# Patient Record
Sex: Male | Born: 1935
Health system: Southern US, Community
[De-identification: ages and names within clinical notes are randomized; demographics above are authoritative.]

## PROBLEM LIST (undated history)

## (undated) DIAGNOSIS — C801 Malignant (primary) neoplasm, unspecified: Secondary | ICD-10-CM

## (undated) HISTORY — PX: TONSILLECTOMY AND ADENOIDECTOMY: SHX28

## (undated) HISTORY — PX: PENILE BIOPSY: SHX6013

## (undated) HISTORY — PX: TONSILLECTOMY: SUR1361

## (undated) HISTORY — DX: Malignant (primary) neoplasm, unspecified: C80.1

## (undated) HISTORY — PX: APPENDECTOMY: SHX54

## (undated) HISTORY — PX: SHOULDER ARTHROSCOPY: SHX128

---

## 1998-04-06 ENCOUNTER — Ambulatory Visit (HOSPITAL_BASED_OUTPATIENT_CLINIC_OR_DEPARTMENT_OTHER): Admission: RE | Admit: 1998-04-06 | Discharge: 1998-04-06 | Payer: Self-pay | Admitting: Urology

## 2001-10-27 ENCOUNTER — Encounter: Admission: RE | Admit: 2001-10-27 | Discharge: 2001-10-27 | Payer: Self-pay | Admitting: Internal Medicine

## 2001-10-27 ENCOUNTER — Encounter: Payer: Self-pay | Admitting: Internal Medicine

## 2003-04-10 ENCOUNTER — Ambulatory Visit (HOSPITAL_BASED_OUTPATIENT_CLINIC_OR_DEPARTMENT_OTHER): Admission: RE | Admit: 2003-04-10 | Discharge: 2003-04-10 | Payer: Self-pay | Admitting: Otolaryngology

## 2004-04-12 ENCOUNTER — Ambulatory Visit (HOSPITAL_COMMUNITY): Admission: RE | Admit: 2004-04-12 | Discharge: 2004-04-12 | Payer: Self-pay | Admitting: Gastroenterology

## 2004-04-18 ENCOUNTER — Ambulatory Visit (HOSPITAL_COMMUNITY): Admission: RE | Admit: 2004-04-18 | Discharge: 2004-04-18 | Payer: Self-pay | Admitting: Gastroenterology

## 2004-06-12 ENCOUNTER — Ambulatory Visit: Admission: RE | Admit: 2004-06-12 | Discharge: 2004-09-10 | Payer: Self-pay | Admitting: Radiation Oncology

## 2004-12-25 ENCOUNTER — Inpatient Hospital Stay (HOSPITAL_COMMUNITY): Admission: AD | Admit: 2004-12-25 | Discharge: 2004-12-27 | Payer: Self-pay | Admitting: Otolaryngology

## 2004-12-25 ENCOUNTER — Encounter (INDEPENDENT_AMBULATORY_CARE_PROVIDER_SITE_OTHER): Payer: Self-pay | Admitting: *Deleted

## 2005-08-08 ENCOUNTER — Ambulatory Visit: Admission: RE | Admit: 2005-08-08 | Discharge: 2005-09-02 | Payer: Self-pay | Admitting: Radiation Oncology

## 2005-09-16 ENCOUNTER — Ambulatory Visit: Admission: RE | Admit: 2005-09-16 | Discharge: 2005-12-09 | Payer: Self-pay | Admitting: Radiation Oncology

## 2007-02-05 ENCOUNTER — Encounter: Admission: RE | Admit: 2007-02-05 | Discharge: 2007-02-05 | Payer: Self-pay | Admitting: Otolaryngology

## 2007-02-10 ENCOUNTER — Ambulatory Visit (HOSPITAL_BASED_OUTPATIENT_CLINIC_OR_DEPARTMENT_OTHER): Admission: RE | Admit: 2007-02-10 | Discharge: 2007-02-10 | Payer: Self-pay | Admitting: Otolaryngology

## 2007-02-14 ENCOUNTER — Ambulatory Visit: Payer: Self-pay | Admitting: Internal Medicine

## 2007-10-26 ENCOUNTER — Encounter (HOSPITAL_BASED_OUTPATIENT_CLINIC_OR_DEPARTMENT_OTHER): Payer: Self-pay | Admitting: General Surgery

## 2007-10-26 ENCOUNTER — Ambulatory Visit (HOSPITAL_COMMUNITY): Admission: RE | Admit: 2007-10-26 | Discharge: 2007-10-26 | Payer: Self-pay | Admitting: General Surgery

## 2008-06-28 ENCOUNTER — Encounter: Admission: RE | Admit: 2008-06-28 | Discharge: 2008-06-28 | Payer: Self-pay | Admitting: Internal Medicine

## 2011-04-29 NOTE — Op Note (Signed)
Nathan Morse, Nathan Morse                ACCOUNT NO.:  [id number]   MEDICAL RECORD NO.:  [id number]          PATIENT TYPE:  AMB   LOCATION:  SDS                          FACILITY:  [facility]   PHYSICIAN:  Leonie Man, M.D.   DATE OF BIRTH:  [birth year]   DATE OF PROCEDURE:  10/26/2007  DATE OF DISCHARGE:  09/20/2007                               OPERATIVE REPORT   PREOPERATIVE DIAGNOSIS:  Left inguinal hernia.   POSTOPERATIVE DIAGNOSIS:  Left inguinal hernia.   PROCEDURE:  Repair of left inguinal hernia with mesh.   SURGEON:  Leonie Man, M.D.   ASSISTANT:  OR tech.   Anesthesia is general.   Findings are left sided direct indirect inguinal hernia.   SPECIMENS TO THE LABORATORY:  Hernia sac to pathology.   Estimated blood loss is minimal.   There are no complication.   The patient returned to the PACU in excellent condition.   Nathan Morse is a 77 year old man status post radiation therapy for  prostate cancer in the remote past who is now referred for repair of a  left sided inguinal hernia which has been mildly symptomatic over the  past several months.  The patient is aware of the risks and potential  benefits of surgery and gives his consent the same.   PROCEDURE:  With the patient positioned supinely, following the  induction of satisfactory general anesthesia, the left lower abdomen is  prepped and draped to be included in the sterile operative field.  A  transverse incision in the lower abdominal crease is deemed through the  skin and subcutaneous tissue, dissecting down to the external oblique  aponeurosis.  This is opened up through the external inguinal ring with  isolation of the spermatic cord which is held with a Penrose drain.  The  ilioinguinal nerve was retracted inferiorly and laterally.  Dissection  along the anterior medial aspect of the cord reveals a very large hernia  sac which is dissected free from the cord contents, carrying dissection  up to the  internal ring.  The sac is opened and there are no  intraabdominal contents within it.  The second loop was suture ligated  at its neck with 2-0 silk sutures and the redundant sac removed and was  forwarded for pathologic evaluation.  A large lipoma with a cord is also  removed and ligated with 2-0 silk.  The floor of the inguinal canal is  then repaired with a polypropylene mesh which is fashioned to fit into  this area and then sutured down at the pubic tubercle and carried up  along the conjoint tendon with a 2-0 Novafil suture up to the internal  ring and again from the conjoint tendon up along the shelving edge of  Poupart ligament to the internal ring.  The mesh is split so as the  spermatic cord may be trimmed between the leaflets and the leaflets of  the mesh were trimmed and sewn down into the internal oblique muscle  behind the cord.  All areas of the dissection were checked for  hemostasis, noted to be dry.  Sponge and instrument counts were verified  and the external oblique aponeurosis closed over the spermatic cord with  a running 2-0 Vicryl suture there by reapproximating the external ring.  Scarpa fascia is closed with a running 3-0 Vicryl suture and the skin is  closed with a running 4-0 Monocryl suture placed intradermally.  The  wound was then reinforced with Steri-Strips, sterile dressings were  applied and the anesthetic reversed, and the patient removed from  operating room to the recovery room in stable condition.  He tolerated  the procedure well.      Leonie Man, M.D.  Electronically Signed     PB/MEDQ  D:  10/26/2007  T:  10/26/2007  Job:  [id number]   cc:   Leonie Man, M.D.  Lindaann Slough, M.D.  Olene Craven, M.D.

## 2011-05-02 NOTE — Discharge Summary (Signed)
NAMEMARILYN, WING                ACCOUNT NO.:  [id number]   MEDICAL RECORD NO.:  [id number]          PATIENT TYPE:  INP   LOCATION:  5708                         FACILITY:  [facility]   PHYSICIAN:  Zola Button T. Lazarus Salines, M.D. DATE OF BIRTH:  1934/12/03   DATE OF ADMISSION:  12/25/2004  DATE OF DISCHARGE:  12/27/2004                                 DISCHARGE SUMMARY   CHIEF COMPLAINT:  Obstructive sleep apnea, nasal obstruction.   HISTORY OF PRESENT ILLNESS:  This is a 77 year old black male with a long  history of poor breathing through his nose, snoring and mild to moderate  obstructive sleep apnea with a respiratory disturbance index of 25.  He had  been wearing CPAP with fair tolerance and fair control, but disliked it.   PAST MEDICAL HISTORY:  No medical allergies.  No current medications.  No  history of hypertension, stroke, heart attack, asthma, HIV, hepatitis,  bleeding disorders or anesthetic reactions.  He had a previous shoulder  surgery, penile implant surgery and appendectomy.   FAMILY HISTORY:  No significant family history.   SOCIAL HISTORY:  He does not smoke or drink.  He does chew tobacco.   REVIEW OF SYSTEMS:  Noncontributory.   PHYSICAL EXAMINATION:  GENERAL:  This was a tall, trim middle-aged black  male in no distress.  HEENT:  He had a left septal deviation with obstruction.  He had a long,  thin soft palate with small residual tonsils.  CHEST:  Clear with unlabored respirations.  HEART:  Regular rate and rhythm without murmurs.  ABDOMEN:  Soft with active bowel sounds and no tenderness.  EXTREMITIES:  Normal configuration.   ADMISSION DIAGNOSIS:  Deviated nasal septum, hypertrophic turbinates with  nasal obstruction, obstructive sleep apnea.   HOSPITAL COURSE:  On the day of his admission the patient was taken to the  operating room, where he underwent a nasal septoplasty, bilateral SMR of  inferior turbinates, tonsillectomy and UPPP.  He was observed  afterward with  good tolerance of analgesics and good recovery from his anesthesia.  On the  first postoperative day, he was breathing well but slow to eat on account of  pain.  He was kept in the hospital one additional day.  On the second  postoperative day, he had his nasal packs pulled out.  Overall, he was  evidencing better status and was discharged to his home in the care of his  family with good pain control, good breathing, and good p.o. intake.   DISCHARGE DIAGNOSES:  Deviated nasal septum with obstruction, hypertrophic  inferior turbinates with obstruction, obstructive sleep apnea.   PROCEDURE:  Reformed septoplasty, submucous resection of turbinates,  tonsillectomy, uvulopalatopharyngoplasty December 25, 2004.   COMPLICATIONS:  None.   CONDITION ON DISCHARGE:  Ambulatory with good p.o. intake, good  respirations, good pain control.   FOLLOWUP:  Return visit in 10 days at my office.   DISCHARGE MEDICATIONS:  Prescriptions given preoperatively for Roxicet  liquid, Tylenol with Codeine liquid and cephalexin liquid.   Instructions written and given.      KTW/MEDQ  D:  03/14/2005  T:  03/14/2005  Job:  [id number]   cc:   Olene Craven, M.D.  [address]  [address]  [place]  [place] [address]  Fax: [phone]

## 2011-05-02 NOTE — Procedures (Signed)
NAMEGORDIE, Nathan Morse                ACCOUNT NO.:  [id number]   MEDICAL RECORD NO.:  [id number]          PATIENT TYPE:  OUT   LOCATION:  SLEEP CENTER                 FACILITY:  [facility]   PHYSICIAN:  Clinton D. Maple Hudson, MD, FCCP, FACPDATE OF BIRTH:  1934/05/01   DATE OF STUDY:  02/10/2007                            NOCTURNAL POLYSOMNOGRAM   INDICATION FOR STUDY:  Hypersomnia with sleep apnea.   EPWORTH SLEEPINESS SCORE:  22/24, BMI 27.8, weight 210 pounds.   MEDICATIONS:  Home medication listed as ipratropium nasal spray.   SLEEP ARCHITECTURE:  Total sleep time 335 minutes with sleep efficiency  77%.  Stage I was 15%, stage II 63%, stages III and IV absent, REM 22%  of total sleep time.  Sleep latency 13 minutes, REM latency 43 minutes,  awake after sleep onset 86 minutes, arousal index 12.7.  No bedtime  medication was taken.   RESPIRATORY DATA:  Split study protocol.  Apnea/hypopnea index (AHI,  RDI) 23.5 obstructive events per hour, indicating moderate obstructive  sleep apnea/hypopnea syndrome before CPAP.  This included 9 obstructive  apneas, one mixed apnea and 47 hypopneas.  Events were not positional.  REM AHI 20.4.  CPAP was titrated to 11 CWP, AHI 0 per hour.  A medium  ResMed Quattro full face mask was used with a heated humidifier.   OXYGEN DATA:  Moderate snoring with oxygen desaturation to a nadir of  85% before CPAP.  After CPAP control, oxygen saturation held 94% on room  air.   CARDIAC DATA:  Sinus rhythm with occasional PVCs.   MOVEMENT-PARASOMNIA:  Occasional limb jerk, insignificant.   IMPRESSIONS-RECOMMENDATIONS:  1. Moderate obstructive sleep apnea/hypopnea syndrome, apnea-hypopnea      index 23.5 per hour with nonpositional events, moderate snoring and      oxygen desaturation to a nadir of 85%.  2. Successful CPAP titration to 11 CWP, apnea-hypopnea index 0 per      hour.  A medium ResMed Quattro full face mask was chosen, with      heated  humidifier.      Clinton D. Maple Hudson, MD, [facility], FACP  Diplomate, Biomedical engineer of Sleep Medicine  Electronically Signed    CDY/MEDQ  D:  02/14/2007 10:39:06  T:  02/14/2007 15:11:53  Job:  [id number]

## 2011-05-02 NOTE — Op Note (Signed)
Nathan Morse, Nathan Morse                            ACCOUNT NO.:  [id number]   MEDICAL RECORD NO.:  [id number]                   PATIENT TYPE:  AMB   LOCATION:  ENDO                                 FACILITY:  [facility]   PHYSICIAN:  Anselmo Rod, M.D.               DATE OF BIRTH:  11/19/1934   DATE OF PROCEDURE:  04/12/2004  DATE OF DISCHARGE:                                 OPERATIVE REPORT   PROCEDURE:  Colonoscopy.   ENDOSCOPIST:  Anselmo Rod, M.D.   INSTRUMENT USED:  Olympus video colonoscope.   INDICATIONS FOR PROCEDURE:  A 77 year old African-American male with a  history of prostate cancer and abnormal weight loss undergoing screening  colonoscopy to rule out colonic polyps, masses, etc.   PREPROCEDURE PREPARATION:  Informed consent was procured from the patient.  The patient was fasted for eight hours prior to the procedure and prepped  with a bottle of magnesium citrate and a gallon of GoLYTELY the night prior  to the procedure.   PREPROCEDURE PHYSICAL EXAMINATION:  VITAL SIGNS:  Stable.  NECK:  Supple.  CHEST:  Clear to auscultation.  HEART:  S1 and S2 regular.  ABDOMEN:  Soft with normal bowel sounds.   DESCRIPTION OF PROCEDURE:  The patient was placed in the left lateral  decubitus position and sedated with 75 mg of Demerol and 7 mg of Versed  intravenously.  Once the patient was adequately sedated and maintained on  low flow oxygen and continuous cardiac monitoring, the Olympus video  colonoscope was advanced from the rectum to the hepatic flexure.  With  extreme difficulty, the patient had a large amount of residual stool in the  colon.  Multiple washings were done.  The patient's position was changed  from the left lateral to the supine and the right lateral positions and  again back to the supine and the left lateral position with gentle  application of abdominal pressure in several locations to reach the cecum.  However, all of these efforts did not help reach  the cecum.  The patient  tolerated the procedure well without complications.  Scope was withdrawn.  Retroflexion in the rectum revealed no abnormalities.   IMPRESSION:  Incomplete colonoscopy up to the hepatic flexure.  No masses or  polyps seen.  Large amount of residual stool in the colon, small lesions  could have been missed.   RECOMMENDATIONS:  Reprep and redo an ACBE.  Outpatient followup after the BE  has been done.                                               Anselmo Rod, M.D.    JNM/MEDQ  D:  04/12/2004  T:  04/13/2004  Job:  [id number]   cc:   Chana Bode  Nesi, M.D.  [address]  [place]  [place] [address]  Fax: [phone]   Olene Craven, M.D.  [address]  [address]  [place]  [place] [address]  Fax: [phone]

## 2011-05-02 NOTE — Op Note (Signed)
Nathan Morse, Nathan Morse                ACCOUNT NO.:  [id number]   MEDICAL RECORD NO.:  [id number]          PATIENT TYPE:  OIB   LOCATION:  2550                         FACILITY:  [facility]   PHYSICIAN:  Zola Button T. Lazarus Salines, M.D. DATE OF BIRTH:  [birth year]   DATE OF PROCEDURE:  12/25/2004  DATE OF DISCHARGE:                                 OPERATIVE REPORT   PREOPERATIVE DIAGNOSES:  1.  Nasal septal deviation with obstruction.  2.  Hypertrophic inferior turbinates with obstruction.  3.  Obstructive sleep apnea.   POSTOPERATIVE DIAGNOSES:  1.  Nasal septal deviation with obstruction.  2.  Hypertrophic inferior turbinates with obstruction.  3.  Obstructive sleep apnea.   PROCEDURE PERFORMED:  1.  Nasal septoplasty.  2.  Bilateral submucous resection of inferior turbinates.  3.  Tonsillectomy.  4.  Uvulopalatopharyngoplasty.   SURGEON:  Gloris Manchester. Lazarus Salines, M.D.   ANESTHESIA:  General orotracheal.   BLOOD LOSS:  Minimal.   COMPLICATIONS:  None.   FINDINGS:  A severe buckled leftward septal deviation with obstruction.  Hypertrophic right inferior turbinate. A large left inferior turbinate with  a crease where the septal spur was abutting the turbinate.  1+ embedded  tonsils. A long relatively thin soft palate, especially the posterior  tonsillar pillars.  A long, thin, central uvula. Clear nasopharynx.   DESCRIPTION OF PROCEDURE:  With the patient in the comfortable supine  position, general orotracheal anesthesia was induced without difficulty. At  an appropriate level, the patient was turned 90 degrees and placed in a semi-  sitting position. Saline moistened throat pack was placed. Nasal vibrissae  were trimmed. Cocaine crystals, 200 milligrams total were applied on cotton  carriers to the anterior ethmoid and sphenopalatine ganglion regions on both  sides. Cocaine solution, 160 milligrams total was applied on 1/2 x 3 inches  cottonoids to both sides of the septum. 1% Xylocaine with  1:100,000  epinephrine, 12 cc total was infiltrated into the membranous columella, into  the nasal spine region, into the anterior floor of the nose on both sides,  and finally into the submucoperichondrial plane of the septum on both sides.  Several minutes were allowed to this to take effect. A sterile preparation  and draping of the midface was accomplished.   The materials were removed from the nose and observed to be intact and  correct in number. The findings were as described above. A small left-sided  floor incision was sharply executed. A right-sided hemitransfixion incision  was executed and carried down to a floor incision. Floor tunnels were  elevated on both sides, carried back to the vomer and brought forward. The  submucoperichondrial plane of the right side of the septum was elevated  taking pains to carefully protect the flap as it was removed from the severe  crease of the septal spur. The right septal flap was raised intact. The  chondroethmoid junction was identified and opened with the Cottle elevator  and the opposite submucoperiosteal plane of the perpendicular plate was  elevated. The superior perpendicular plate was lysed with an open Leane Para-  Middleton forceps. The  inferior portion was submucosally dissected and  rocked free with a closed Jansen-Middleton forceps. There was a heavy septal  spur to the left and there was a moderate sized mucosal rent at this site.  Additional posterior perpendicular plate and the posterior tag of the  quadrangular cartilage were submucosally dissected and resected. The  posterior aspect of the maxillary crest/vomer which was severely displaced  towards the left was also mobilized, submucosally dissected, and removed.  The heavily buckled inferior 3 - 4 mm of the quadrangular cartilage was  incised and the almost horizontal inferior portion was submucosally  dissected. This allowed the septum to trapdoor into the midline, although  it  still tended to have some spring towards the left. Maxillary crest was  cleaned up of small amounts of residual cartilage and perichondrium. The  septum was separated from the upper lateral cartilages transmucosally on the  left side and in the septal tunnel on the right side. This allowed more  freedom to the septum to trapdoor into the midline without residual  deformity. The septum was secured to the nasal spine with a figure-of-eight  4-0 PDS suture. The septal tunnel was carefully suctioned free and  hemostasis was observed. The septal incisions were closed with interrupted 4-  0 chromic sutures.   Just prior to completion of the septoplasty, the inferior turbinates were  infiltrated with a total of 6 cc 1% Xylocaine with 1:100,000 epinephrine.  Beginning on the right side, the anterior hood of the inferior turbinate was  sharply incised just behind the nasal valve region. The medial mucosa was  incised in an anterior upsloping fashion. A laterally based flap was  developed. The turbinate was infractured. Using angled turbinate scissors,  the turbinate bone and lateral mucosa was resected in a posterior  downsloping fashion taking virtually all of the anterior pole and leaving  virtually all the posterior pole. Bony spicules were removed to allow more  prompt healing. Suction cautery was used to reduce the bulbous posterior  pole of the turbinate and cut mucosal edges were also suction coagulated.  The flap was laid back down, the turbinate was outfractured and the right  side was completed. The left side was done in identical fashion.   0.040 reinforced Silastic splints were fashioned and placed against the  septum on both sides and secured thereto with a 3-0 nylon stitch. Double  thickness Telfa packs impregnated with bacitracin ointment were applied low  on the turbinates on both sides. 6.5 mm nasal trumpets which had been shortened to reach in the nasopharynx were placed in  between the Silastic  splints and the tonsil packs to serve as part of the nasal packing, but also  to allow some degree of postoperative airway through the nose. At this point  the nose was completed. Hemostasis was observed. The pharynx was suctioned  free and the throat pack was removed.   At this point the patient was placed in Trendelenburg and the draping was  adjusted. Taking care to protect lips, teeth/gums, and endotracheal tube,  the McIvor mouth gag was introduced, expanded for visualization, and  suspended from the Mayo stand in the standard fashion. The findings were as  described above. Palate retractor and mirror were used to visualize the  nasopharynx with the findings as described above. 0.5% Xylocaine with  1:200,000 epinephrine, 8 cc total was infiltrated into the peritonsillar  plane on both sides and along the palatal arch proposed incision site. The  tattoo placed in  the office was identified.   Using cutting and coagulating cautery, an arch incision was planned just  above the tattoo mark and all the way down the anterior tonsillar pillar on  both sides. A chevron was placed in the midline to correspond with a  posterior mucosal flap to prevent a completely linear scar band along the  arch of the soft palate. Working from the anterior tonsillar pillar, the  capsule of the tonsil was identified on both sides and using the cautery tip  as a blunt dissector, lysing fibrous bands, and coagulating crossing  vessels, the tonsils were dissected free of the muscular fossae, but  remained pedicled on the palatal flap. The  tonsils were removed in their  entirety as the determined by examination of both tonsil and fossa. A small  additional quantity of cautery rendered the tonsil fossae hemostatic.  Working up into the soft palate, the dissection was carried up along the  posterior tonsillar pillar which was shortened but not resected in its  entirety and then across the  soft palate leaving a small flap on the  posterior surface of the musculus uvulae and small portion of the musculus  uvulae was left intact as well. Hemostasis was observed. The uvular muscle  was brought forward into the midline with a 4-0 Vicryl stitch. The soft  palatal posterior pillar arch was incised in a 45 degrees fashion leaving it  laterally and with a midline flap. The lateral flaps were reapproximated to  the anterior tonsillar pillar with interrupted 4-0 Vicryl sutures. The  posterior mucosa of the soft palate was brought forward and stretched  laterally and the midline portion of soft palate was closed in this fashion.  A small raw surface was left on the posterior surface of the soft palate  laterally on either side. Hemostasis was observed. With mirror examination,  a good AP diameter of the nasal pharyngeal introitus was generated and a  good intact closure of the soft palate free margin was also generated. An orogastric tube was briefly placed and essentially no stomach secretions  were evacuated. The tube was removed. At this point the mouth gag was  relaxed for several minutes. Upon re-expansion, hemostasis was persistent.  At this point the mouth gag was relaxed and removed. The dental status was  intact. The patient was returned to Anesthesia, awakened, extubated, and  transferred to recovery in stable condition.   COMMENT:  A 77 year old black male with long history of nasal obstruction,  snoring, documented sleep apnea and the anatomic findings including septal  deviation, hypertrophic turbinates, and a long thin soft palate were the  indications for today's procedure. Anticipate a routine postoperative  recovery with attention to analgesia, antibiosis, hydration, and observation  for bleeding, emesis, airway compromise, or oxygen desaturation. Will  observe him 23 hours in the step-down unit, given his history of sleep  apnea.      Karo   KTW/MEDQ  D:   12/25/2004  T:  12/25/2004  Job:  [id number]   cc:   Olene Craven, M.D.  [address]  [address]  [place]  [place] [address]  Fax: [phone]

## 2011-08-28 ENCOUNTER — Ambulatory Visit (INDEPENDENT_AMBULATORY_CARE_PROVIDER_SITE_OTHER): Payer: Medicare Other

## 2011-08-28 ENCOUNTER — Inpatient Hospital Stay (INDEPENDENT_AMBULATORY_CARE_PROVIDER_SITE_OTHER)
Admission: RE | Admit: 2011-08-28 | Discharge: 2011-08-28 | Disposition: A | Discharge status: Home / Self Care | Payer: Medicare Other | Source: Ambulatory Visit | Attending: Family Medicine | Admitting: Family Medicine

## 2011-08-28 DIAGNOSIS — M799 Soft tissue disorder, unspecified: Secondary | ICD-10-CM

## 2011-09-23 LAB — COMPREHENSIVE METABOLIC PANEL
ALT: 11
Albumin: 3.8
Alkaline Phosphatase: 98
Glucose, Bld: 78
Potassium: 4.3
Sodium: 139
Total Protein: 6.6

## 2011-09-23 LAB — DIFFERENTIAL
Basophils Relative: 1
Eosinophils Absolute: 0
Monocytes Absolute: 0.5
Monocytes Relative: 11

## 2011-09-23 LAB — CBC
Hemoglobin: 14.1
Platelets: 187
RDW: 12.9
WBC: 5

## 2011-09-23 LAB — TSH: TSH: 1.335

## 2015-09-03 ENCOUNTER — Ambulatory Visit (INDEPENDENT_AMBULATORY_CARE_PROVIDER_SITE_OTHER): Payer: Medicare Other | Admitting: Family Medicine

## 2015-09-03 VITALS — BP 126/68 | HR 95 | Temp 98.0°F | Resp 16 | Ht 74.0 in | Wt 198.6 lb

## 2015-09-03 DIAGNOSIS — J019 Acute sinusitis, unspecified: Secondary | ICD-10-CM | POA: Diagnosis not present

## 2015-09-03 MED ORDER — GUAIFENESIN-CODEINE 100-10 MG/5ML PO SOLN: 10.0000 mL | ORAL | Status: DC | PRN

## 2015-09-03 MED ORDER — BENZONATATE 100 MG PO CAPS: 100.0000 mg | ORAL_CAPSULE | Freq: Three times a day (TID) | ORAL | Status: DC | PRN

## 2015-09-03 MED ORDER — AMOXICILLIN 500 MG PO CAPS: 1000.0000 mg | ORAL_CAPSULE | Freq: Two times a day (BID) | ORAL | Status: DC

## 2015-09-03 NOTE — Patient Instructions (Signed)
You will be on an antibiotic so do not need antibiotic eye drops in addition but you can place saline drops. Apply a warm wet compress as often as you can. If you have a lot of discharge, wash your eyelashes with no tears baby shampoo and your fingers. Do NOT use any eye drops for red eyes. Conjunctivitis Conjunctivitis is commonly called "pink eye." Conjunctivitis can be caused by bacterial or viral infection, allergies, or injuries. There is usually redness of the lining of the eye, itching, discomfort, and sometimes discharge. There may be deposits of matter along the eyelids. A viral infection usually causes a watery discharge, while a bacterial infection causes a yellowish, thick discharge. Pink eye is very contagious and spreads by direct contact. You may be given antibiotic eyedrops as part of your treatment. Before using your eye medicine, remove all drainage from the eye by washing gently with warm water and cotton balls. Continue to use the medication until you have awakened 2 mornings in a row without discharge from the eye. Do not rub your eye. This increases the irritation and helps spread infection. Use separate towels from other household members. Wash your hands with soap and water before and after touching your eyes. Use cold compresses to reduce pain and sunglasses to relieve irritation from light. Do not wear contact lenses or wear eye makeup until the infection is gone. SEEK MEDICAL CARE IF:   Your symptoms are not better after 3 days of treatment.  You have increased pain or trouble seeing.  The outer eyelids become very red or swollen. Document Released: 01/08/2005 Document Revised: 02/23/2012 Document Reviewed: 12/01/2005 [facility] Patient Information 2015 [place], [place]. This information is not intended to replace advice given to you by your health care provider. Make sure you discuss any questions you have with your health care provider.  Sinusitis Sinusitis is redness,  soreness, and inflammation of the paranasal sinuses. Paranasal sinuses are air pockets within the bones of your face (beneath the eyes, the middle of the forehead, or above the eyes). In healthy paranasal sinuses, mucus is able to drain out, and air is able to circulate through them by way of your nose. However, when your paranasal sinuses are inflamed, mucus and air can become trapped. This can allow bacteria and other germs to grow and cause infection. Sinusitis can develop quickly and last only a short time (acute) or continue over a long period (chronic). Sinusitis that lasts for more than 12 weeks is considered chronic.  CAUSES  Causes of sinusitis include:  Allergies.  Structural abnormalities, such as displacement of the cartilage that separates your nostrils (deviated septum), which can decrease the air flow through your nose and sinuses and affect sinus drainage.  Functional abnormalities, such as when the small hairs (cilia) that line your sinuses and help remove mucus do not work properly or are not present. SIGNS AND SYMPTOMS  Symptoms of acute and chronic sinusitis are the same. The primary symptoms are pain and pressure around the affected sinuses. Other symptoms include:  Upper toothache.  Earache.  Headache.  Bad breath.  Decreased sense of smell and taste.  A cough, which worsens when you are lying flat.  Fatigue.  Fever.  Thick drainage from your nose, which often is green and may contain pus (purulent).  Swelling and warmth over the affected sinuses. DIAGNOSIS  Your health care provider will perform a physical exam. During the exam, your health care provider may:  Look in your nose for signs  of abnormal growths in your nostrils (nasal polyps).  Tap over the affected sinus to check for signs of infection.  View the inside of your sinuses (endoscopy) using an imaging device that has a light attached (endoscope). If your health care provider suspects that you  have chronic sinusitis, one or more of the following tests may be recommended:  Allergy tests.  Nasal culture. A sample of mucus is taken from your nose, sent to a lab, and screened for bacteria.  Nasal cytology. A sample of mucus is taken from your nose and examined by your health care provider to determine if your sinusitis is related to an allergy. TREATMENT  Most cases of acute sinusitis are related to a viral infection and will resolve on their own within 10 days. Sometimes medicines are prescribed to help relieve symptoms (pain medicine, decongestants, nasal steroid sprays, or saline sprays).  However, for sinusitis related to a bacterial infection, your health care provider will prescribe antibiotic medicines. These are medicines that will help kill the bacteria causing the infection.  Rarely, sinusitis is caused by a fungal infection. In theses cases, your health care provider will prescribe antifungal medicine. For some cases of chronic sinusitis, surgery is needed. Generally, these are cases in which sinusitis recurs more than 3 times per year, despite other treatments. HOME CARE INSTRUCTIONS   Drink plenty of water. Water helps thin the mucus so your sinuses can drain more easily.  Use a humidifier.  Inhale steam 3 to 4 times a day (for example, sit in the bathroom with the shower running).  Apply a warm, moist washcloth to your face 3 to 4 times a day, or as directed by your health care provider.  Use saline nasal sprays to help moisten and clean your sinuses.  Take medicines only as directed by your health care provider.  If you were prescribed either an antibiotic or antifungal medicine, finish it all even if you start to feel better. SEEK IMMEDIATE MEDICAL CARE IF:  You have increasing pain or severe headaches.  You have nausea, vomiting, or drowsiness.  You have swelling around your face.  You have vision problems.  You have a stiff neck.  You have difficulty  breathing. MAKE SURE YOU:   Understand these instructions.  Will watch your condition.  Will get help right away if you are not doing well or get worse. Document Released: 12/01/2005 Document Revised: 04/17/2014 Document Reviewed: 12/16/2011 [facility] Patient Information 2015 [place], [place]. This information is not intended to replace advice given to you by your health care provider. Make sure you discuss any questions you have with your health care provider.

## 2015-09-03 NOTE — Progress Notes (Signed)
Subjective:  This chart was scribed for Delman Cheadle, MD by Thea Alken, ED Scribe. This patient was seen in room 1 and the patient's care was started at 4:32 PM.   Patient ID: Nathan Morse, male    DOB: 02/17/1934, 81 y.o.   MRN: [id number]  HPI   Chief Complaint  Patient presents with  . Cough    x 2 weeks   . Eye Problem    x this morning, possible pink eye    HPI Comments: Nathan Morse is a 81 y.o. male who presents to the [facility] complaining of a gradually worsening, productive cough with white phlegm for 2 weeks. States cough is worse at night, keeping him awake during the night. Pt reports he is usually cold all the time but has had chills as well as post nasal drip, sinus pressure, and nasal congestion. He takes zyrtec, mucinex DM and Atrovent. He's used afrin in the past, but stopped once symptoms improved. He denies wheezing. He denies loss of appetite, but states he lost 30+ lbs over the past 2 years after the death of wife. States he does not know how to cook. No hx of smoking.   Pt woke up this morning with his crusted shut with some redness, occasional itchiness and clear white drainage. He denies eye pain.  Pt is seen at [facility]  There are no active problems to display for this patient.  Past Medical History  Diagnosis Date  . Cancer    Prior to Admission medications   Medication Sig Start Date End Date Taking? Authorizing Provider  cetirizine (ZYRTEC) 10 MG tablet Take 10 mg by mouth daily.   Yes Historical Provider, MD  Dextromethorphan-Guaifenesin (MUCUS RELIEF DM MAX) 5-100 MG/5ML LIQD Take by mouth.   Yes Historical Provider, MD   Review of Systems  Constitutional: Negative for fever, chills and appetite change.  HENT: Positive for congestion, postnasal drip and sinus pressure. Negative for ear discharge and ear pain.   Eyes: Positive for discharge, redness and itching. Negative for pain and visual disturbance.  Respiratory: Positive  for cough and wheezing.    Objective:   Physical Exam  Constitutional: He appears well-developed and well-nourished. No distress.  HENT:  Right Ear: Hearing, external ear and ear canal normal.  Left Ear: Hearing, tympanic membrane, external ear and ear canal normal.  Mouth/Throat: Posterior oropharyngeal erythema present.  Eyes: Conjunctivae and EOM are normal. Pupils are equal, round, and reactive to light.  Neck: Normal range of motion. Neck supple. No thyromegaly present.  Cardiovascular: Normal rate, regular rhythm and normal heart sounds.  Exam reveals no gallop and no friction rub.   No murmur heard. Pulmonary/Chest: Effort normal and breath sounds normal. No respiratory distress. He has no wheezes. He has no rales.  Good air movement  Lymphadenopathy:    He has no cervical adenopathy.  Neurological: He is alert.  Nursing note and vitals reviewed.  Filed Vitals:   09/03/15 1617  BP: 126/68  Pulse: 95  Temp: 98 F (36.7 C)  TempSrc: Oral  Resp: 16  Height: 6\' 2"  (1.88 m)  Weight: 198 lb 9.6 oz (90.084 kg)  SpO2: 99%    Visual Acuity Screening   Right eye Left eye Both eyes  Without correction:     With correction: 20//15 20/15 20/15   Assessment & Plan:   1. Acute sinusitis, recurrence not specified, unspecified location     Meds ordered this encounter  Medications  .  cetirizine (ZYRTEC) 10 MG tablet    Sig: Take 10 mg by mouth daily.  Marland Kitchen Dextromethorphan-Guaifenesin (MUCUS RELIEF DM MAX) 5-100 MG/5ML LIQD    Sig: Take by mouth.  Marland Kitchen amoxicillin (AMOXIL) 500 MG capsule    Sig: Take 2 capsules (1,000 mg total) by mouth 2 (two) times daily.    Dispense:  56 capsule    Refill:  0  . guaiFENesin-codeine 100-10 MG/5ML syrup    Sig: Take 10 mLs by mouth every 4 (four) hours as needed for cough.    Dispense:  180 mL    Refill:  0  . benzonatate (TESSALON) 100 MG capsule    Sig: Take 1-2 capsules (100-200 mg total) by mouth 3 (three) times daily as needed for cough.     Dispense:  40 capsule    Refill:  0    I personally performed the services described in this documentation, which was scribed in my presence. The recorded information has been reviewed and considered, and addended by me as needed.  Delman Cheadle, MD MPH

## 2015-10-02 ENCOUNTER — Ambulatory Visit (INDEPENDENT_AMBULATORY_CARE_PROVIDER_SITE_OTHER): Payer: Medicare Other | Admitting: Emergency Medicine

## 2015-10-02 ENCOUNTER — Ambulatory Visit (INDEPENDENT_AMBULATORY_CARE_PROVIDER_SITE_OTHER): Payer: Medicare Other

## 2015-10-02 VITALS — BP 130/60 | HR 76 | Temp 98.3°F | Resp 18 | Ht 74.0 in | Wt 199.0 lb

## 2015-10-02 DIAGNOSIS — M79645 Pain in left finger(s): Secondary | ICD-10-CM

## 2015-10-02 DIAGNOSIS — S62639A Displaced fracture of distal phalanx of unspecified finger, initial encounter for closed fracture: Secondary | ICD-10-CM

## 2015-10-02 DIAGNOSIS — S62637A Displaced fracture of distal phalanx of left little finger, initial encounter for closed fracture: Secondary | ICD-10-CM | POA: Diagnosis not present

## 2015-10-02 NOTE — Patient Instructions (Signed)
Finger Fracture  Fractures of fingers are breaks in the bones of the fingers. There are many types of fractures. There are different ways of treating these fractures. Your health care provider will discuss the best way to treat your fracture.  CAUSES  Traumatic injury is the main cause of broken fingers. These include:  · Injuries while playing sports.  · Workplace injuries.  · Falls.  RISK FACTORS  Activities that can increase your risk of finger fractures include:  · Sports.  · Workplace activities that involve machinery.  · A condition called osteoporosis, which can make your bones less dense and cause them to fracture more easily.  SIGNS AND SYMPTOMS  The main symptoms of a broken finger are pain and swelling within 15 minutes after the injury. Other symptoms include:  · Bruising of your finger.  · Stiffness of your finger.  · Numbness of your finger.  · Exposed bones (compound fracture) if the fracture is severe.  DIAGNOSIS   The best way to diagnose a broken bone is with X-ray imaging. Additionally, your health care provider will use this X-ray image to evaluate the position of the broken finger bones.   TREATMENT   Finger fractures can be treated with:   · Nonreduction--This means the bones are in place. The finger is splinted without changing the positions of the bone pieces. The splint is usually left on for about a week to 10 days. This will depend on your fracture and what your health care provider thinks.  · Closed reduction--The bones are put back into position without using surgery. The finger is then splinted.  · Open reduction and internal fixation--The fracture site is opened. Then the bone pieces are fixed into place with pins or some type of hardware. This is seldom required. It depends on the severity of the fracture.  HOME CARE INSTRUCTIONS   · Follow your health care provider's instructions regarding activities, exercises, and physical therapy.  · Only take over-the-counter or prescription  medicines for pain, discomfort, or fever as directed by your health care provider.  SEEK MEDICAL CARE IF:  You have pain or swelling that limits the motion or use of your fingers.  SEEK IMMEDIATE MEDICAL CARE IF:   Your finger becomes numb.  MAKE SURE YOU:   · Understand these instructions.  · Will watch your condition.  · Will get help right away if you are not doing well or get worse.     This information is not intended to replace advice given to you by your health care provider. Make sure you discuss any questions you have with your health care provider.     Document Released: 03/15/2001 Document Revised: 09/21/2013 Document Reviewed: 07/13/2013  Elsevier Interactive Patient Education ©2016 [employer]

## 2015-10-02 NOTE — Progress Notes (Signed)
Subjective:  Patient ID: Nathan Morse, male    DOB: 1934/12/15  Age: 81 y.o. MRN: [id number]  CC: Fall   HPI Nathan Morse presents  with an injury to his left small finger. He fell yesterday when he tripped and landed on his outstretched left hand he injured his left small finger. He has pain in the DIP joint he is known to have swollen joints chronically. He denies any other complaint related to fall  History Nathan Morse has a past medical history of Cancer ([place]).   He has past surgical history that includes Appendectomy and Tonsillectomy and adenoidectomy.   His  family history is not on file.  He   reports that he has never smoked. His smokeless tobacco use includes Chew. He reports that he does not drink alcohol or use illicit drugs.  Outpatient Prescriptions Prior to Visit  Medication Sig Dispense Refill  . cetirizine (ZYRTEC) 10 MG tablet Take 10 mg by mouth daily.    Marland Kitchen amoxicillin (AMOXIL) 500 MG capsule Take 2 capsules (1,000 mg total) by mouth 2 (two) times daily. 56 capsule 0  . benzonatate (TESSALON) 100 MG capsule Take 1-2 capsules (100-200 mg total) by mouth 3 (three) times daily as needed for cough. 40 capsule 0  . Dextromethorphan-Guaifenesin (MUCUS RELIEF DM MAX) 5-100 MG/5ML LIQD Take by mouth.    Marland Kitchen guaiFENesin-codeine 100-10 MG/5ML syrup Take 10 mLs by mouth every 4 (four) hours as needed for cough. 180 mL 0   No facility-administered medications prior to visit.    Social History   Social History  . Marital Status: Married    Spouse Name: N/A  . Number of Children: N/A  . Years of Education: N/A   Social History Main Topics  . Smoking status: Never Smoker   . Smokeless tobacco: Current User    Types: Chew  . Alcohol Use: No  . Drug Use: No  . Sexual Activity: Not Asked   Other Topics Concern  . None   Social History Narrative     Review of Systems  Constitutional: Negative for fever, chills and appetite change.  HENT: Negative for congestion, ear  pain, postnasal drip, sinus pressure and sore throat.   Eyes: Negative for pain and redness.  Respiratory: Negative for cough, shortness of breath and wheezing.   Cardiovascular: Negative for leg swelling.  Gastrointestinal: Negative for nausea, vomiting, abdominal pain, diarrhea, constipation and blood in stool.  Endocrine: Negative for polyuria.  Genitourinary: Negative for dysuria, urgency, frequency and flank pain.  Musculoskeletal: Negative for gait problem.  Skin: Negative for rash.  Neurological: Negative for weakness and headaches.  Psychiatric/Behavioral: Negative for confusion and decreased concentration. The patient is not nervous/anxious.     Objective:  BP 130/60 mmHg  Pulse 76  Temp(Src) 98.3 F (36.8 C) (Oral)  Resp 18  Ht 6\' 2"  (1.88 m)  Wt 199 lb (90.266 kg)  BMI 25.54 kg/m2  SpO2 99%  Physical Exam  Constitutional: He is oriented to person, place, and time. He appears well-developed and well-nourished.  HENT:  Head: Normocephalic and atraumatic.  Eyes: Conjunctivae are normal. Pupils are equal, round, and reactive to light.  Pulmonary/Chest: Effort normal.  Musculoskeletal: He exhibits no edema.       Left hand: He exhibits tenderness and bony tenderness.  He has swelling and tenderness at the DIP joint of fifth finger deformity he has marked limitation of motion  Neurological: He is alert and oriented to person, place, and time.  Skin: Skin  is dry.  Psychiatric: He has a normal mood and affect. His behavior is normal. Thought content normal.      Assessment & Plan:   Nathan Morse was seen today for fall.  Diagnoses and all orders for this visit:  Finger pain, left -     DG Finger Little Left; Future  Fracture, finger, distal phalanx, closed, initial encounter -     Ambulatory referral to Orthopedic Surgery   I have discontinued [name] Dextromethorphan-Guaifenesin, amoxicillin, guaiFENesin-codeine, and benzonatate. I am also having him maintain  his cetirizine.  No orders of the defined types were placed in this encounter.    Appropriate red flag conditions were discussed with the patient as well as actions that should be taken.  Patient expressed his understanding.  Follow-up: Return if symptoms worsen or fail to improve.  Roselee Culver, MD   [facility] reading (PRIMARY) by  Dr. Ouida Sills.  Fracture DIP.

## 2016-06-17 ENCOUNTER — Encounter (HOSPITAL_COMMUNITY): Payer: Self-pay | Admitting: Emergency Medicine

## 2016-06-17 ENCOUNTER — Telehealth (HOSPITAL_COMMUNITY): Payer: Self-pay | Admitting: Emergency Medicine

## 2016-06-17 ENCOUNTER — Ambulatory Visit (HOSPITAL_COMMUNITY)
Admission: EM | Admit: 2016-06-17 | Discharge: 2016-06-17 | Disposition: A | Discharge status: Home / Self Care | Payer: Medicare Other | Attending: Family Medicine | Admitting: Family Medicine

## 2016-06-17 DIAGNOSIS — K5909 Other constipation: Secondary | ICD-10-CM

## 2016-06-17 MED ORDER — POLYETHYLENE GLYCOL 3350 17 GM/SCOOP PO POWD: 1.0000 | Freq: Once | ORAL | Status: DC

## 2016-06-17 NOTE — ED Notes (Signed)
Patient went to bathroom, patient had good results

## 2016-06-17 NOTE — ED Notes (Signed)
Last bm has been more than a week.  But patient says he only has a bm 1-2 a week.  Patient has been drinking magnesium citrate today, but no results yet.

## 2016-06-17 NOTE — Discharge Instructions (Signed)

## 2016-06-17 NOTE — ED Provider Notes (Signed)
CSN: [id number]     Arrival date & time 06/17/16  1349 History   None    Chief Complaint  Patient presents with  . Constipation   (Consider location/radiation/quality/duration/timing/severity/associated sxs/prior Treatment) HPI Comments: Patient states he has not had a BM in over a week and went to the pharmacy and was told to drink half of a magnesium citrate and he is having some stomach discomfort.   Patient is a 82 y.o. male presenting with constipation. The history is provided by the patient.  Constipation Severity:  Severe Time since last bowel movement:  1 week Timing:  Constant Progression:  Worsening Chronicity:  New Stool description:  Hard Relieved by:  Laxatives Worsened by:  Nothing tried Ineffective treatments:  None tried Associated symptoms: abdominal pain     Past Medical History  Diagnosis Date  . Cancer [facility])    Past Surgical History  Procedure Laterality Date  . Appendectomy    . Tonsillectomy and adenoidectomy     No family history on file. Social History  Substance Use Topics  . Smoking status: Never Smoker   . Smokeless tobacco: Current User    Types: Chew  . Alcohol Use: No    Review of Systems  Constitutional: Negative.   HENT: Negative.   Eyes: Negative.   Respiratory: Negative.   Gastrointestinal: Positive for abdominal pain and constipation.  Endocrine: Negative.   Genitourinary: Negative.   Musculoskeletal: Negative.   Skin: Negative.   Allergic/Immunologic: Negative.   Neurological: Negative.   Hematological: Negative.   Psychiatric/Behavioral: Negative.     Allergies  Review of patient's allergies indicates no known allergies.  Home Medications   Prior to Admission medications   Medication Sig Start Date End Date Taking? Authorizing Provider  cetirizine (ZYRTEC) 10 MG tablet Take 10 mg by mouth daily.    Historical Provider, MD  polyethylene glycol powder (MIRALAX) powder Take 255 g by mouth once. 06/17/16   Lysbeth Penner, FNP   Meds Ordered and Administered this Visit  Medications - No data to display  BP 150/94 mmHg  Pulse 83  Temp(Src) 97.9 F (36.6 C) (Oral)  Resp 22  SpO2 99% No data found.   Physical Exam  Constitutional: He appears well-developed and well-nourished.  HENT:  Head: Normocephalic.  Right Ear: External ear normal.  Left Ear: External ear normal.  Mouth/Throat: Oropharynx is clear and moist.  Eyes: Conjunctivae are normal. Pupils are equal, round, and reactive to light.  Neck: Normal range of motion.  Cardiovascular: Normal rate, regular rhythm and normal heart sounds.   Pulmonary/Chest: Effort normal and breath sounds normal.  Abdominal: Soft.  Bowel sounds hyperactive.  Digital exam reveals soft stool in rectal vault and patient actually has small BM.    ED Course  Procedures (including critical care time)  Labs Review Labs Reviewed - No data to display  Imaging Review No results found.   Visual Acuity Review  Right Eye Distance:   Left Eye Distance:   Bilateral Distance:    Right Eye Near:   Left Eye Near:    Bilateral Near:         MDM   1. Other constipation   After digital exam patient is taken to bathroom where he has BM with good results. Advised to go home and continue the rest of the half bottle of magnesium citrate and  Then take miralax qd. Miralax one scoop qd #255 grams and follow up with pcp for chronic constipation.  Lysbeth Penner, FNP 06/17/16 1451

## 2016-06-17 NOTE — ED Notes (Signed)
Pharmacy questioned prescribed, daily dose.  Clarified order with bill oxford, np.  Mirilax Take 17g once a day.  Quantity 255 g.  , no refills.

## 2016-07-14 ENCOUNTER — Ambulatory Visit
Admission: RE | Admit: 2016-07-14 | Discharge: 2016-07-14 | Disposition: A | Discharge status: Home / Self Care | Payer: Medicare Other | Source: Ambulatory Visit | Attending: Internal Medicine | Admitting: Internal Medicine

## 2016-07-14 ENCOUNTER — Other Ambulatory Visit: Payer: Self-pay | Admitting: Internal Medicine

## 2016-07-14 DIAGNOSIS — M25562 Pain in left knee: Secondary | ICD-10-CM

## 2017-03-24 DIAGNOSIS — R490 Dysphonia: Secondary | ICD-10-CM | POA: Insufficient documentation

## 2017-05-28 ENCOUNTER — Emergency Department (HOSPITAL_COMMUNITY)
Admission: EM | Admit: 2017-05-28 | Discharge: 2017-05-28 | Disposition: A | Discharge status: Home / Self Care | Payer: Medicare Other | Attending: Emergency Medicine | Admitting: Emergency Medicine

## 2017-05-28 ENCOUNTER — Encounter (HOSPITAL_COMMUNITY): Payer: Self-pay | Admitting: Emergency Medicine

## 2017-05-28 DIAGNOSIS — Y748 Miscellaneous general hospital and personal-use devices associated with adverse incidents, not elsewhere classified: Secondary | ICD-10-CM | POA: Diagnosis not present

## 2017-05-28 DIAGNOSIS — N4830 Priapism, unspecified: Secondary | ICD-10-CM

## 2017-05-28 DIAGNOSIS — F17220 Nicotine dependence, chewing tobacco, uncomplicated: Secondary | ICD-10-CM | POA: Insufficient documentation

## 2017-05-28 DIAGNOSIS — T83490A Other mechanical complication of penile (implanted) prosthesis, initial encounter: Secondary | ICD-10-CM | POA: Diagnosis present

## 2017-05-28 NOTE — ED Triage Notes (Signed)
Pt presents with penile pump inflated for past 2 hrs and unable to get down. Pt reports discomfort.

## 2017-05-28 NOTE — ED Provider Notes (Signed)
[place] DEPT Provider Note   CSN: [id number] Arrival date & time: 05/28/17  2157  By signing my name below, I, Dora Sims, attest that this documentation has been prepared under the direction and in the presence of physician practitioner, Virgel Manifold, MD. Electronically Signed: Dora Sims, Scribe. 05/28/2017. 10:55 PM.  History   Chief Complaint Chief Complaint  Patient presents with  . Penile Pump Problem   The history is provided by the patient. No language interpreter was used.    HPI Comments: Nathan Morse is a 83 y.o. male who presents to the Emergency Department complaining of a problem with his penile pump. He states his pump has been inflated for the past few hours and he has been unable to deflate it because he cannot locate the lever. Patient has never had this problem in the past and has used a penile pump for 10 years. He denies any testicular pain or any other associated symptoms.  Past Medical History:  Diagnosis Date  . Cancer ([place])     There are no active problems to display for this patient.   Past Surgical History:  Procedure Laterality Date  . APPENDECTOMY    . TONSILLECTOMY AND ADENOIDECTOMY         Home Medications    Prior to Admission medications   Medication Sig Start Date End Date Taking? Authorizing Provider  polyethylene glycol powder (MIRALAX) powder Take 255 g by mouth once. Patient not taking: Reported on 05/28/2017 06/17/16   Lysbeth Penner, FNP    Family History No family history on file.  Social History Social History  Substance Use Topics  . Smoking status: Never Smoker  . Smokeless tobacco: Current User    Types: Chew  . Alcohol use No     Allergies   Patient has no known allergies.   Review of Systems Review of Systems  Genitourinary: Negative for testicular pain.  Skin: Negative for color change.  All other systems reviewed and are negative.  Physical Exam Updated Vital Signs BP (!) 144/82 (BP  Location: Right Arm)   Pulse 70   Temp 98.4 F (36.9 C) (Oral)   Resp 16   Ht 6\' 1"  (1.854 m)   Wt 200 lb (90.7 kg)   SpO2 99%   BMI 26.39 kg/m   Physical Exam  Constitutional: He appears well-developed and well-nourished.  HENT:  Head: Normocephalic.  Right Ear: External ear normal.  Left Ear: External ear normal.  Nose: Nose normal.  Eyes: Conjunctivae are normal. Right eye exhibits no discharge. Left eye exhibits no discharge.  Neck: Normal range of motion.  Cardiovascular: Normal rate, regular rhythm and normal heart sounds.   No murmur heard. Pulmonary/Chest: Effort normal and breath sounds normal. No respiratory distress. He has no wheezes. He has no rales.  Abdominal: Soft. There is no tenderness. There is no rebound and no guarding.  Genitourinary:  Genitourinary Comments: Erect penis. Device palpable in scrotum. No concerning lesions noted.   Musculoskeletal: Normal range of motion. He exhibits no edema or tenderness.  Neurological: He is alert. No cranial nerve deficit. Coordination normal.  Skin: Skin is warm and dry. No rash noted. No erythema. No pallor.  Psychiatric: He has a normal mood and affect. His behavior is normal.  Nursing note and vitals reviewed.  ED Treatments / Results  Labs (all labs ordered are listed, but only abnormal results are displayed) Labs Reviewed - No data to display  EKG  EKG Interpretation None  Radiology No results found.  Procedures Procedures (including critical care time)  DIAGNOSTIC STUDIES: Oxygen Saturation is 99% on RA, normal by my interpretation.    COORDINATION OF CARE: 10:49 PM Discussed treatment plan with pt at bedside and pt agreed to plan.  Medications Ordered in ED Medications - No data to display   Initial Impression / Assessment and Plan / ED Course  I have reviewed the triage vital signs and the nursing notes.  Pertinent labs & imaging results that were available during my care of the  patient were reviewed by me and considered in my medical decision making (see chart for details).     83yM with penis prosthesis problem. I'm not sure what I did that he wasn't, but I managed to deflate it. He feels fine now. No other complaints. He reports this is the second time he has had difficulty in the past week after about a decade of no significant issues. Recommended he follow-up with his urologist.   Final Clinical Impressions(s) / ED Diagnoses   Final diagnoses:  Priapism, unspecified    New Prescriptions New Prescriptions   No medications on file   I personally preformed the services scribed in my presence. The recorded information has been reviewed is accurate. Virgel Manifold, MD.    Virgel Manifold, MD 06/08/17 [phone]

## 2017-08-27 ENCOUNTER — Encounter (HOSPITAL_COMMUNITY): Payer: Self-pay | Admitting: Emergency Medicine

## 2017-08-27 ENCOUNTER — Ambulatory Visit (HOSPITAL_COMMUNITY)
Admission: EM | Admit: 2017-08-27 | Discharge: 2017-08-27 | Disposition: A | Discharge status: Home / Self Care | Payer: Medicare Other | Attending: Family Medicine | Admitting: Family Medicine

## 2017-08-27 DIAGNOSIS — T63441A Toxic effect of venom of bees, accidental (unintentional), initial encounter: Secondary | ICD-10-CM

## 2017-08-27 DIAGNOSIS — W57XXXA Bitten or stung by nonvenomous insect and other nonvenomous arthropods, initial encounter: Secondary | ICD-10-CM | POA: Diagnosis not present

## 2017-08-27 DIAGNOSIS — T63444A Toxic effect of venom of bees, undetermined, initial encounter: Secondary | ICD-10-CM

## 2017-08-27 NOTE — Discharge Instructions (Signed)
You may take Benadryl for any symptoms. If you develop problems with breathing, wheezing, swelling in the mouth or throat or in the hands or feet, hives, sweating or other problems in the next few hours good rectal emergency department. It is unlikely at this time that she will develop any of these serious symptoms related to your cousins death.

## 2017-08-27 NOTE — ED Provider Notes (Signed)
[place]    CSN: [id number] Arrival date & time: 08/27/17  1923     History   Chief Complaint Chief Complaint  Patient presents with  . Insect Bite    HPI Nathan Morse is a 83 y.o. male.   83 year old healthy-appearing male presents to the urgent care after he was stung by 3 wasps. One to the back of the right hand, one to the left upper arm and one to the right ear. This occurred at 7 PM. He has had no symptoms from the stings. The only complaint is some mild burning at the bee sting sites. The reason for coming to the urgent care was puffs here because of a cousin that died from bee stings in the remote past. He said states he feels well and has no other complaints. He has taken no medication and states he does not really want to.      Past Medical History:  Diagnosis Date  . Cancer ([place])     There are no active problems to display for this patient.   Past Surgical History:  Procedure Laterality Date  . APPENDECTOMY    . TONSILLECTOMY AND ADENOIDECTOMY         Home Medications    Prior to Admission medications   Not on File    Family History No family history on file.  Social History Social History  Substance Use Topics  . Smoking status: Never Smoker  . Smokeless tobacco: Current User    Types: Chew  . Alcohol use No     Allergies   Patient has no known allergies.   Review of Systems Review of Systems  Constitutional: Negative.  Negative for fever.  HENT: Negative.   Respiratory: Negative.  Negative for chest tightness, shortness of breath and wheezing.   Cardiovascular: Negative for chest pain and leg swelling.  Gastrointestinal: Negative.   Skin: Negative.  Negative for rash.  Neurological: Negative.   All other systems reviewed and are negative.    Physical Exam Triage Vital Signs ED Triage Vitals [08/27/17 1955]  Enc Vitals Group     BP (!) 132/93     Pulse Rate 79     Resp 20     Temp 98.1 F (36.7 C)   Temp Source Oral     SpO2 98 %     Weight      Height      Head Circumference      Peak Flow      Pain Score      Pain Loc      Pain Edu?      Excl. in [place]?    No data found.   Updated Vital Signs BP (!) 132/93 (BP Location: Right Arm)   Pulse 79   Temp 98.1 F (36.7 C) (Oral)   Resp 20   SpO2 98%   Visual Acuity Right Eye Distance:   Left Eye Distance:   Bilateral Distance:    Right Eye Near:   Left Eye Near:    Bilateral Near:     Physical Exam  Constitutional: He is oriented to person, place, and time. He appears well-developed and well-nourished. No distress.  HENT:  Mouth/Throat: Oropharynx is clear and moist. No oropharyngeal exudate.  Examination of the bee sting to the right ear reveals a 4 mm slightly raised area of the skin. No erythema or swelling.  Eyes: EOM are normal.  Neck: Normal range of motion. Neck supple.  Cardiovascular: Normal  rate, regular rhythm, normal heart sounds and intact distal pulses.   Pulmonary/Chest: Effort normal and breath sounds normal. No respiratory distress. He has no wheezes. He has no rales.  Musculoskeletal: He exhibits no edema.  Lymphadenopathy:    He has no cervical adenopathy.  Neurological: He is alert and oriented to person, place, and time. He exhibits normal muscle tone.  Skin: Skin is warm and dry. He is not diaphoretic.  Psychiatric: He has a normal mood and affect.  Nursing note and vitals reviewed.    UC Treatments / Results  Labs (all labs ordered are listed, but only abnormal results are displayed) Labs Reviewed - No data to display  EKG  EKG Interpretation None       Radiology No results found.  Procedures Procedures (including critical care time)  Medications Ordered in UC Medications - No data to display   Initial Impression / Assessment and Plan / UC Course  I have reviewed the triage vital signs and the nursing notes.  Pertinent labs & imaging results that were available during my  care of the patient were reviewed by me and considered in my medical decision making (see chart for details).     You may take Benadryl for any symptoms. If you develop problems with breathing, wheezing, swelling in the mouth or throat or in the hands or feet, hives, sweating or other problems in the next few hours good rectal emergency department. It is unlikely at this time that she will develop any of these serious symptoms related to your cousins death.   Final Clinical Impressions(s) / UC Diagnoses   Final diagnoses:  Bee sting, undetermined intent, initial encounter    New Prescriptions Current Discharge Medication List       Controlled Substance Prescriptions [place] Controlled Substance Registry consulted? Not Applicable   Janne Napoleon, NP 08/27/17 2033

## 2017-08-27 NOTE — ED Triage Notes (Addendum)
Patient states he was stung by 4-5 wasps around 7:00pm this evening.  Patient has not had reactions in the past, but had a family member to die due to stinging insects and this scared him.  Patient is not having any hives, no swelling, no difficulty breathing

## 2017-08-27 NOTE — ED Notes (Signed)
Patient verbalized understanding of discharge instructions and denies any further needs or questions at this time. VS stable. Patient ambulatory with steady gait.  

## 2018-05-24 ENCOUNTER — Ambulatory Visit
Admission: RE | Admit: 2018-05-24 | Discharge: 2018-05-24 | Disposition: A | Discharge status: Home / Self Care | Payer: Medicare Other | Source: Ambulatory Visit | Attending: Nurse Practitioner | Admitting: Nurse Practitioner

## 2018-05-24 ENCOUNTER — Other Ambulatory Visit: Payer: Self-pay | Admitting: Nurse Practitioner

## 2018-05-24 DIAGNOSIS — R634 Abnormal weight loss: Secondary | ICD-10-CM

## 2018-11-17 ENCOUNTER — Encounter: Payer: Self-pay | Admitting: Nurse Practitioner

## 2018-11-17 ENCOUNTER — Ambulatory Visit (INDEPENDENT_AMBULATORY_CARE_PROVIDER_SITE_OTHER): Payer: Medicare Other | Admitting: Nurse Practitioner

## 2018-11-17 ENCOUNTER — Ambulatory Visit: Payer: Medicare Other

## 2018-11-17 VITALS — BP 132/72 | HR 72 | Temp 98.2°F | Ht 72.5 in | Wt 191.2 lb

## 2018-11-17 DIAGNOSIS — Z0000 Encounter for general adult medical examination without abnormal findings: Secondary | ICD-10-CM

## 2018-11-17 DIAGNOSIS — Z23 Encounter for immunization: Secondary | ICD-10-CM

## 2018-11-17 DIAGNOSIS — R591 Generalized enlarged lymph nodes: Secondary | ICD-10-CM

## 2018-11-17 DIAGNOSIS — M545 Low back pain, unspecified: Secondary | ICD-10-CM

## 2018-11-17 DIAGNOSIS — G8929 Other chronic pain: Secondary | ICD-10-CM | POA: Diagnosis not present

## 2018-11-17 DIAGNOSIS — M25562 Pain in left knee: Secondary | ICD-10-CM

## 2018-11-17 NOTE — Progress Notes (Signed)
Subjective:     Patient ID: Nathan Morse , male    DOB: 07/06/1934 , 84 y.o.   MRN: [id number]   Chief Complaint  Patient presents with  . Annual Exam    HPI  Health Maintenance Weight is down 4lbs since July still eating the same.   Men's preventive visit. Patient Health Questionnaire (PHQ-2) is    Clinical Support from 11/17/2018 in [facility]  PHQ-2 Total Score  0    . Patient is on a regular diet, he eats out daily and drinks about 2 bottles of water daily. Marital status: Married. Relevant history for alcohol use is:    Social History   Substance and Sexual Activity  Alcohol Use No  . Alcohol/week: 0.0 standard drinks  . Relevant history for tobacco use is:  Social History   Tobacco Use  Smoking Status Never Smoker  Smokeless Tobacco Current User  . Types: Chew  .  Past Medical History:  Diagnosis Date  . Cancer [facility]      Family History  Adopted: Yes  Family history unknown: Yes     Current Outpatient Medications:  .  alfuzosin (UROXATRAL) 10 MG 24 hr tablet, Take 10 mg by mouth daily with breakfast., Disp: , Rfl:  .  mirabegron ER (MYRBETRIQ) 50 MG TB24 tablet, Take 50 mg by mouth daily., Disp: , Rfl:    No Known Allergies   Review of Systems  Constitutional: Negative.  Negative for fatigue.  HENT: Negative.   Eyes: Negative.   Respiratory: Negative.   Cardiovascular: Negative.   Gastrointestinal: Negative.   Endocrine: Negative.  Negative for polydipsia, polyphagia and polyuria.  Genitourinary: Negative.   Musculoskeletal: Positive for arthralgias (left knee pain) and back pain.  Skin: Negative.   Allergic/Immunologic: Negative.   Neurological: Negative.  Negative for dizziness and headaches.  Hematological: Negative.   Psychiatric/Behavioral: Negative.      Today's Vitals   11/17/18 1123  BP: 132/72  Pulse: 72  Temp: 98.2 F (36.8 C)  TempSrc: Oral  SpO2: 99%  Weight: 191 lb 3.2 oz (86.7 kg)  Height: 6' 0.5"  (1.842 m)  PainSc: 2    Body mass index is 25.57 kg/m.   Objective:  Physical Exam Vitals signs reviewed.  Constitutional:      Appearance: He is well-developed and well-nourished.  HENT:     Head: Normocephalic and atraumatic.     Right Ear: External ear normal.     Left Ear: External ear normal.     Nose: Nose normal.     Mouth/Throat:     Mouth: Oropharynx is clear and moist.  Eyes:     Extraocular Movements: EOM normal.     Conjunctiva/sclera: Conjunctivae normal.     Pupils: Pupils are equal, round, and reactive to light.  Neck:     Musculoskeletal: Normal range of motion and neck supple.  Cardiovascular:     Rate and Rhythm: Normal rate and regular rhythm.     Heart sounds: Normal heart sounds, S1 normal and S2 normal. No murmur.  Pulmonary:     Effort: Pulmonary effort is normal.     Breath sounds: Normal breath sounds.  Abdominal:     General: Bowel sounds are normal.     Palpations: Abdomen is soft.  Musculoskeletal: Normal range of motion.        General: No deformity or edema.     Comments: Tenderness to low back on palpation  Neurological:     Mental  Status: He is alert and oriented to person, place, and time.         Assessment And Plan:     1. Encounter for health maintenance examination . Behavior modifications discussed and diet history reviewed.   . Pt will continue to exercise regularly and modify diet with low GI, plant based foods and decrease intake of processed foods.  . Recommend intake of daily multivitamin, Vitamin D, and calcium.  . Recommend preventive screenings, as well as recommend immunizations that include influenza, TDAP, and Shingles - CBC no Diff - TSH - BMP8+eGFR  2. Chronic bilateral low back pain without sciatica  Chronic  Will repeat lumbar spine xray for extent of degenerative disc disease and consider referral to orthopedics for further evaluation and possible injections - DG Lumbar Spine Complete; Future  3. Chronic  pain of left knee  Will repeat xray to evaluate extent of osteoarthritis due to worsening and persistent pain - DG Knee Complete 4 Views Left; Future  4. Lymphadenopathy  Right side of neck appears with lymphadenopathy, with his history of smoking I will check neck ultrasound to evaluate for possible mass. - CBC no Diff - US Soft Tissue Head/Neck; Future     [name], FNP

## 2018-11-17 NOTE — Patient Instructions (Signed)
Mr. Nathan Morse , Thank you for taking time to come for your Medicare Wellness Visit. I appreciate your ongoing commitment to your health goals. Please review the following plan we discussed and let me know if I can assist you in the future.   Screening recommendations/referrals: Colonoscopy: not required Recommended yearly ophthalmology/optometry visit for glaucoma screening and checkup Recommended yearly dental visit for hygiene and checkup  Vaccinations: Influenza vaccine: today Pneumococcal vaccine: decline Tdap vaccine: 05/2012 Shingles vaccine:      Advanced directives: Please bring a copy of your POA (Power of Attorney) and/or Living Will to your next appointment.    Conditions/risks identified: Chews tobacco. States he wants to quit, but nothing has been invented to help with that.  Next appointment: 05/19/2019 at 10:45a  Preventive Care 65 Years and Older, Male Preventive care refers to lifestyle choices and visits with your health care provider that can promote health and wellness. What does preventive care include?  A yearly physical exam. This is also called an annual well check.  Dental exams once or twice a year.  Routine eye exams. Ask your health care provider how often you should have your eyes checked.  Personal lifestyle choices, including:  Daily care of your teeth and gums.  Regular physical activity.  Eating a healthy diet.  Avoiding tobacco and drug use.  Limiting alcohol use.  Practicing safe sex.  Taking low doses of aspirin every day.  Taking vitamin and mineral supplements as recommended by your health care provider. What happens during an annual well check? The services and screenings done by your health care provider during your annual well check will depend on your age, overall health, lifestyle risk factors, and family history of disease. Counseling  Your health care provider may ask you questions about your:  Alcohol use.  Tobacco  use.  Drug use.  Emotional well-being.  Home and relationship well-being.  Sexual activity.  Eating habits.  History of falls.  Memory and ability to understand (cognition).  Work and work Statistician. Screening  You may have the following tests or measurements:  Height, weight, and BMI.  Blood pressure.  Lipid and cholesterol levels. These may be checked every 5 years, or more frequently if you are over 42 years old.  Skin check.  Lung cancer screening. You may have this screening every year starting at age 72 if you have a 30-pack-year history of smoking and currently smoke or have quit within the past 15 years.  Fecal occult blood test (FOBT) of the stool. You may have this test every year starting at age 73.  Flexible sigmoidoscopy or colonoscopy. You may have a sigmoidoscopy every 5 years or a colonoscopy every 10 years starting at age 80.  Prostate cancer screening. Recommendations will vary depending on your family history and other risks.  Hepatitis C blood test.  Hepatitis B blood test.  Sexually transmitted disease (STD) testing.  Diabetes screening. This is done by checking your blood sugar (glucose) after you have not eaten for a while (fasting). You may have this done every 1-3 years.  Abdominal aortic aneurysm (AAA) screening. You may need this if you are a current or former smoker.  Osteoporosis. You may be screened starting at age 75 if you are at high risk. Talk with your health care provider about your test results, treatment options, and if necessary, the need for more tests. Vaccines  Your health care provider may recommend certain vaccines, such as:  Influenza vaccine. This is recommended every  year.  Tetanus, diphtheria, and acellular pertussis (Tdap, Td) vaccine. You may need a Td booster every 10 years.  Zoster vaccine. You may need this after age 61.  Pneumococcal 13-valent conjugate (PCV13) vaccine. One dose is recommended after age  11.  Pneumococcal polysaccharide (PPSV23) vaccine. One dose is recommended after age 51. Talk to your health care provider about which screenings and vaccines you need and how often you need them. This information is not intended to replace advice given to you by your health care provider. Make sure you discuss any questions you have with your health care provider. Document Released: 12/28/2015 Document Revised: 08/20/2016 Document Reviewed: 10/02/2015 Elsevier Interactive Patient Education  2017 [place] Prevention in the Home Falls can cause injuries. They can happen to people of all ages. There are many things you can do to make your home safe and to help prevent falls. What can I do on the outside of my home?  Regularly fix the edges of walkways and driveways and fix any cracks.  Remove anything that might make you trip as you walk through a door, such as a raised step or threshold.  Trim any bushes or trees on the path to your home.  Use bright outdoor lighting.  Clear any walking paths of anything that might make someone trip, such as rocks or tools.  Regularly check to see if handrails are loose or broken. Make sure that both sides of any steps have handrails.  Any raised decks and porches should have guardrails on the edges.  Have any leaves, snow, or ice cleared regularly.  Use sand or salt on walking paths during winter.  Clean up any spills in your garage right away. This includes oil or grease spills. What can I do in the bathroom?  Use night lights.  Install grab bars by the toilet and in the tub and shower. Do not use towel bars as grab bars.  Use non-skid mats or decals in the tub or shower.  If you need to sit down in the shower, use a plastic, non-slip stool.  Keep the floor dry. Clean up any water that spills on the floor as soon as it happens.  Remove soap buildup in the tub or shower regularly.  Attach bath mats securely with double-sided  non-slip rug tape.  Do not have throw rugs and other things on the floor that can make you trip. What can I do in the bedroom?  Use night lights.  Make sure that you have a light by your bed that is easy to reach.  Do not use any sheets or blankets that are too big for your bed. They should not hang down onto the floor.  Have a firm chair that has side arms. You can use this for support while you get dressed.  Do not have throw rugs and other things on the floor that can make you trip. What can I do in the kitchen?  Clean up any spills right away.  Avoid walking on wet floors.  Keep items that you use a lot in easy-to-reach places.  If you need to reach something above you, use a strong step stool that has a grab bar.  Keep electrical cords out of the way.  Do not use floor polish or wax that makes floors slippery. If you must use wax, use non-skid floor wax.  Do not have throw rugs and other things on the floor that can make you trip. What can  I do with my stairs?  Do not leave any items on the stairs.  Make sure that there are handrails on both sides of the stairs and use them. Fix handrails that are broken or loose. Make sure that handrails are as long as the stairways.  Check any carpeting to make sure that it is firmly attached to the stairs. Fix any carpet that is loose or worn.  Avoid having throw rugs at the top or bottom of the stairs. If you do have throw rugs, attach them to the floor with carpet tape.  Make sure that you have a light switch at the top of the stairs and the bottom of the stairs. If you do not have them, ask someone to add them for you. What else can I do to help prevent falls?  Wear shoes that:  Do not have high heels.  Have rubber bottoms.  Are comfortable and fit you well.  Are closed at the toe. Do not wear sandals.  If you use a stepladder:  Make sure that it is fully opened. Do not climb a closed stepladder.  Make sure that both  sides of the stepladder are locked into place.  Ask someone to hold it for you, if possible.  Clearly mark and make sure that you can see:  Any grab bars or handrails.  First and last steps.  Where the edge of each step is.  Use tools that help you move around (mobility aids) if they are needed. These include:  Canes.  Walkers.  Scooters.  Crutches.  Turn on the lights when you go into a dark area. Replace any light bulbs as soon as they burn out.  Set up your furniture so you have a clear path. Avoid moving your furniture around.  If any of your floors are uneven, fix them.  If there are any pets around you, be aware of where they are.  Review your medicines with your doctor. Some medicines can make you feel dizzy. This can increase your chance of falling. Ask your doctor what other things that you can do to help prevent falls. This information is not intended to replace advice given to you by your health care provider. Make sure you discuss any questions you have with your health care provider. Document Released: 09/27/2009 Document Revised: 05/08/2016 Document Reviewed: 01/05/2015 Elsevier Interactive Patient Education  2017 [employer].

## 2018-11-17 NOTE — Progress Notes (Signed)
Subjective:   Nathan Morse is a 84 y.o. male who presents for Medicare Annual/Subsequent preventive examination.  Review of Systems:  n/a Cardiac Risk Factors include: advanced age (>54men, >70 women);sedentary lifestyle;smoking/ tobacco exposure     Objective:    Vitals: BP 132/72 (BP Location: Left Arm, Patient Position: Sitting)   Pulse 72   Temp 98.2 F (36.8 C)   Ht 6' 0.5" (1.842 m)   Wt 191 lb 3.2 oz (86.7 kg)   SpO2 99%   BMI 25.57 kg/m   Body mass index is 25.57 kg/m.  Advanced Directives 11/17/2018 05/28/2017  Does Patient Have a Medical Advance Directive? Yes No  Does patient want to make changes to medical advance directive? No - Patient declined -    Tobacco Social History   Tobacco Use  Smoking Status Never Smoker  Smokeless Tobacco Current User  . Types: Chew     Ready to quit: Yes Counseling given: Not Answered   Clinical Intake:  Pre-visit preparation completed: Yes  Pain : 0-10 Pain Score: 2  Pain Location: Back Pain Orientation: Lower Pain Descriptors / Indicators: Aching Pain Onset: More than a month ago Pain Frequency: Intermittent Pain Relieving Factors: rest Effect of Pain on Daily Activities: stumbles around and loses balance  Pain Relieving Factors: rest  Nutritional Status: BMI 25 -29 Overweight Nutritional Risks: None Diabetes: No  How often do you need to have someone help you when you read instructions, pamphlets, or other written materials from your doctor or pharmacy?: 2 - Rarely(friend reads for himm if needed) What is the last grade level you completed in school?: 4th grade  Interpreter Needed?: No  Information entered by :: NAllen LPN  Past Medical History:  Diagnosis Date  . Cancer [facility])    Past Surgical History:  Procedure Laterality Date  . APPENDECTOMY    . TONSILLECTOMY AND ADENOIDECTOMY     Family History  Adopted: Yes  Family history unknown: Yes   Social History   Socioeconomic History  .  Marital status: Married    Spouse name: Not on file  . Number of children: Not on file  . Years of education: Not on file  . Highest education level: Not on file  Occupational History  . Occupation: retired  Scientific laboratory technician  . Financial resource strain: Not hard at all  . Food insecurity:    Worry: Never true    Inability: Never true  . Transportation needs:    Medical: No    Non-medical: No  Tobacco Use  . Smoking status: Never Smoker  . Smokeless tobacco: Current User    Types: Chew  Substance and Sexual Activity  . Alcohol use: No    Alcohol/week: 0.0 standard drinks  . Drug use: No  . Sexual activity: Not Currently  Lifestyle  . Physical activity:    Days per week: 0 days    Minutes per session: 0 min  . Stress: Not at all  Relationships  . Social connections:    Talks on phone: Not on file    Gets together: Not on file    Attends religious service: Not on file    Active member of club or organization: Not on file    Attends meetings of clubs or organizations: Not on file    Relationship status: Not on file  Other Topics Concern  . Not on file  Social History Narrative  . Not on file    Outpatient Encounter Medications as of 11/17/2018  Medication  Sig  . alfuzosin (UROXATRAL) 10 MG 24 hr tablet Take 10 mg by mouth daily with breakfast.  . mirabegron ER (MYRBETRIQ) 50 MG TB24 tablet Take 50 mg by mouth daily.   No facility-administered encounter medications on file as of 11/17/2018.     Activities of Daily Living In your present state of health, do you have any difficulty performing the following activities: 11/17/2018  Hearing? N  Vision? N  Difficulty concentrating or making decisions? Y  Comment forgetful  Walking or climbing stairs? N  Dressing or bathing? N  Doing errands, shopping? N  Preparing Food and eating ? N  Using the Toilet? N  In the past six months, have you accidently leaked urine? Y  Comment occasional leakage  Do you have problems with  loss of bowel control? N  Managing your Medications? N  Managing your Finances? N  Housekeeping or managing your Housekeeping? N  Some recent data might be hidden    Patient Care Team: [name], FNP as PCP - General (General Practice)   Assessment:   This is a routine wellness examination for Nathan Morse.  Exercise Activities and Dietary recommendations Current Exercise Habits: The patient does not participate in regular exercise at present, Exercise limited by: None identified  Goals    . DIET - INCREASE WATER INTAKE (pt-stated)       Fall Risk Fall Risk  11/17/2018 10/02/2015  Falls in the past year? 1 Yes  Number falls in past yr: 1 1  Comment lost balance both times -  Injury with Fall? 0 -  Risk for fall due to : History of fall(s) -  Follow up Falls prevention discussed -   Is the patient's home free of loose throw rugs in walkways, pet beds, electrical cords, etc?   yes      Grab bars in the bathroom? yes      Handrails on the stairs?   n/a      Adequate lighting?   yes  Timed Get Up and Go Performed: n/a  Depression Screen PHQ 2/9 Scores 11/17/2018 10/02/2015 09/03/2015  PHQ - 2 Score 0 0 0  PHQ- 9 Score 9 - -    Cognitive Function     6CIT Screen 11/17/2018  What Year? 0 points  What month? 0 points  What time? 0 points  Count back from 20 2 points  Months in reverse 4 points  Repeat phrase 6 points  Total Score 12    Immunization History  Administered Date(s) Administered  . Influenza, High Dose Seasonal PF 11/17/2018    Qualifies for Shingles Vaccine?  yes  Screening Tests Health Maintenance  Topic Date Due  . PNA vac Low Risk Adult (1 of 2 - PCV13) 11/18/2019 (Originally 05/19/2001)  . TETANUS/TDAP  05/21/2022  . INFLUENZA VACCINE  Completed   Cancer Screenings: Lung: Low Dose CT Chest recommended if Age 47-80 years, 30 pack-year currently smoking OR have quit w/in 15years. Patient   qualify. Colorectal: not required  Additional  Screenings:  Hepatitis C Screening:n/a      Plan:    Patient received flu vaccine today. Declined pneumonia vaccine.  I have personally reviewed and noted the following in the patient's chart:   . Medical and social history . Use of alcohol, tobacco or illicit drugs  . Current medications and supplements . Functional ability and status . Nutritional status . Physical activity . Advanced directives . List of other physicians . Hospitalizations, surgeries, and ER visits in previous  12 months . Vitals . Screenings to include cognitive, depression, and falls . Referrals and appointments  In addition, I have reviewed and discussed with patient certain preventive protocols, quality metrics, and best practice recommendations. A written personalized care plan for preventive services as well as general preventive health recommendations were provided to patient.     Kellie Simmering, LPN  15/05/1536

## 2018-11-18 LAB — CBC
Hematocrit: 42.9 % (ref 37.5–51.0)
Hemoglobin: 13.2 g/dL (ref 13.0–17.7)
MCH: 27 pg (ref 26.6–33.0)
MCHC: 30.8 g/dL — AB (ref 31.5–35.7)
MCV: 88 fL (ref 79–97)
Platelets: 163 10*3/uL (ref 150–450)
RBC: 4.88 x10E6/uL (ref 4.14–5.80)
RDW: 12 % — AB (ref 12.3–15.4)
WBC: 5 10*3/uL (ref 3.4–10.8)

## 2018-11-18 LAB — BMP8+EGFR
BUN / CREAT RATIO: 9 — AB (ref 10–24)
BUN: 13 mg/dL (ref 8–27)
CHLORIDE: 102 mmol/L (ref 96–106)
CO2: 25 mmol/L (ref 20–29)
Calcium: 9 mg/dL (ref 8.6–10.2)
Creatinine, Ser: 1.46 mg/dL — ABNORMAL HIGH (ref 0.76–1.27)
GFR calc non Af Amer: 44 mL/min/{1.73_m2} — ABNORMAL LOW (ref >59)
GFR, EST AFRICAN AMERICAN: 51 mL/min/{1.73_m2} — AB (ref >59)
Glucose: 92 mg/dL (ref 65–99)
Potassium: 4.4 mmol/L (ref 3.5–5.2)
SODIUM: 141 mmol/L (ref 134–144)

## 2018-11-18 LAB — TSH: TSH: 0.83 u[IU]/mL (ref 0.450–4.500)

## 2018-11-23 ENCOUNTER — Ambulatory Visit
Admission: RE | Admit: 2018-11-23 | Discharge: 2018-11-23 | Disposition: A | Discharge status: Home / Self Care | Payer: Medicare Other | Source: Ambulatory Visit | Attending: Nurse Practitioner | Admitting: Nurse Practitioner

## 2018-11-23 DIAGNOSIS — M25562 Pain in left knee: Principal | ICD-10-CM

## 2018-11-23 DIAGNOSIS — G8929 Other chronic pain: Secondary | ICD-10-CM

## 2018-11-23 DIAGNOSIS — M545 Low back pain: Principal | ICD-10-CM

## 2018-11-23 DIAGNOSIS — R591 Generalized enlarged lymph nodes: Secondary | ICD-10-CM

## 2018-12-01 ENCOUNTER — Other Ambulatory Visit: Payer: Self-pay | Admitting: Nurse Practitioner

## 2018-12-01 DIAGNOSIS — M545 Low back pain, unspecified: Secondary | ICD-10-CM

## 2018-12-01 DIAGNOSIS — G8929 Other chronic pain: Secondary | ICD-10-CM

## 2018-12-01 DIAGNOSIS — M25562 Pain in left knee: Secondary | ICD-10-CM

## 2019-03-04 ENCOUNTER — Telehealth: Payer: Self-pay

## 2019-03-04 NOTE — Telephone Encounter (Signed)
Attempted to make appt for sickness

## 2019-03-07 ENCOUNTER — Encounter: Payer: Self-pay | Admitting: Nurse Practitioner

## 2019-03-07 ENCOUNTER — Ambulatory Visit: Payer: Medicare Other | Admitting: Nurse Practitioner

## 2019-03-07 ENCOUNTER — Other Ambulatory Visit: Payer: Self-pay

## 2019-03-07 VITALS — BP 120/64 | HR 81 | Temp 97.6°F | Ht 70.6 in | Wt 193.0 lb

## 2019-03-07 DIAGNOSIS — N390 Urinary tract infection, site not specified: Secondary | ICD-10-CM

## 2019-03-07 DIAGNOSIS — R319 Hematuria, unspecified: Principal | ICD-10-CM

## 2019-03-07 DIAGNOSIS — E559 Vitamin D deficiency, unspecified: Secondary | ICD-10-CM

## 2019-03-07 LAB — POCT URINALYSIS DIPSTICK
Glucose, UA: NEGATIVE
Ketones, UA: NEGATIVE
Protein, UA: POSITIVE — AB
Spec Grav, UA: >=1.03 — AB (ref 1.010–1.025)
Urobilinogen, UA: 1 E.U./dL
pH, UA: 6 (ref 5.0–8.0)

## 2019-03-07 MED ORDER — NITROFURANTOIN MONOHYD MACRO 100 MG PO CAPS: 100.0000 mg | ORAL_CAPSULE | Freq: Two times a day (BID) | ORAL | 0 refills | Status: AC

## 2019-03-07 MED ORDER — VITAMIN D (ERGOCALCIFEROL) 1.25 MG (50000 UNIT) PO CAPS: 50000.0000 [IU] | ORAL_CAPSULE | ORAL | 0 refills | Status: DC

## 2019-03-07 NOTE — Progress Notes (Signed)
Subjective:     Patient ID: Nathan Morse , male    DOB: [birth year] , 85 y.o.   MRN: [id number]   Chief Complaint  Patient presents with  . Urinary Frequency    HPI  He initially wanted to be checked for COVID 19 since his son is around many different people.  Denies cough, fever and shortness of breath.      Urinary Frequency   This is a new problem. The current episode started in the past 7 days. The problem occurs every urination. The quality of the pain is described as burning. There has been no fever. He is sexually active. There is no history of pyelonephritis. Associated symptoms include frequency, hesitancy and urgency. Pertinent negatives include no chills or nausea. Treatments tried: he is taking Myrbetriq  There is no history of catheterization. prostate cancer and has a penile implant/pump     Past Medical History:  Diagnosis Date  . Cancer [facility])      Family History  Adopted: Yes  Family history unknown: Yes     Current Outpatient Medications:  .  alfuzosin (UROXATRAL) 10 MG 24 hr tablet, Take 10 mg by mouth daily with breakfast., Disp: , Rfl:  .  mirabegron ER (MYRBETRIQ) 50 MG TB24 tablet, Take 50 mg by mouth daily., Disp: , Rfl:    No Known Allergies   Review of Systems  Constitutional: Negative for chills and fatigue.  Respiratory: Negative for cough, shortness of breath and wheezing.   Cardiovascular: Negative for chest pain, palpitations and leg swelling.  Gastrointestinal: Negative for nausea.  Genitourinary: Positive for frequency, hesitancy and urgency.     Today's Vitals   03/07/19 1124  BP: 120/64  Pulse: 81  Temp: 97.6 F (36.4 C)  TempSrc: Oral  SpO2: 97%  Weight: 193 lb (87.5 kg)  Height: 5' 10.6" (1.793 m)   Body mass index is 27.22 kg/m.   Objective:  Physical Exam Vitals signs reviewed.  Constitutional:      Appearance: Normal appearance.  Cardiovascular:     Rate and Rhythm: Normal rate and regular rhythm.     Pulses:  Normal pulses.     Heart sounds: Normal heart sounds. No murmur.  Pulmonary:     Effort: Pulmonary effort is normal.     Breath sounds: Normal breath sounds.  Abdominal:     General: Abdomen is flat. Bowel sounds are normal. There is no distension.     Palpations: Abdomen is soft.  Genitourinary:    Comments: Smells of urine and noticed pants are slightly wet Skin:    General: Skin is warm and dry.     Capillary Refill: Capillary refill takes less than 2 seconds.  Neurological:     General: No focal deficit present.     Mental Status: He is alert and oriented to person, place, and time.  Psychiatric:        Mood and Affect: Mood normal.        Behavior: Behavior normal.        Thought Content: Thought content normal.        Judgment: Judgment normal.         Assessment And Plan:     1. Urinary tract infection with hematuria, site unspecified  Urine is positive for nitrates will send for urine culture - nitrofurantoin, macrocrystal-monohydrate, (MACROBID) 100 MG capsule; Take 1 capsule (100 mg total) by mouth 2 (two) times daily for 5 days.  Dispense: 10 capsule; Refill: 0 - Culture,  Urine  2. Vitamin D deficiency  Will check vitamin D level and supplement as needed.     Also encouraged to spend 15 minutes in the sun daily.  - Vitamin D, Ergocalciferol, (DRISDOL) 1.25 MG (50000 UT) CAPS capsule; Take 1 capsule (50,000 Units total) by mouth every 7 (seven) days.  Dispense: 24 capsule; Refill: 0       [name], FNP

## 2019-03-09 LAB — URINE CULTURE

## 2019-03-11 ENCOUNTER — Telehealth: Payer: Self-pay

## 2019-03-11 NOTE — Telephone Encounter (Signed)
-----   Message from [name], [place] sent at 03/09/2019 12:04 PM EDT ----- You definitely had a urinary tract infection, the medication I gave you should help, let me know how you are doing. Make sure you are staying well hydrated with water

## 2019-03-11 NOTE — Telephone Encounter (Signed)
1st attempt to give lab results  

## 2019-03-22 DIAGNOSIS — R319 Hematuria, unspecified: Secondary | ICD-10-CM

## 2019-03-22 DIAGNOSIS — N390 Urinary tract infection, site not specified: Secondary | ICD-10-CM | POA: Insufficient documentation

## 2019-03-22 DIAGNOSIS — E559 Vitamin D deficiency, unspecified: Secondary | ICD-10-CM | POA: Insufficient documentation

## 2019-03-22 HISTORY — DX: Urinary tract infection, site not specified: N39.0

## 2019-03-22 HISTORY — DX: Urinary tract infection, site not specified: R31.9

## 2019-05-19 ENCOUNTER — Encounter: Payer: Self-pay | Admitting: Nurse Practitioner

## 2019-05-19 ENCOUNTER — Ambulatory Visit: Payer: Medicare Other | Admitting: Nurse Practitioner

## 2019-05-19 ENCOUNTER — Other Ambulatory Visit: Payer: Self-pay

## 2019-05-19 VITALS — BP 122/70 | HR 71 | Temp 97.8°F | Ht 70.6 in | Wt 189.4 lb

## 2019-05-19 DIAGNOSIS — Z87440 Personal history of urinary (tract) infections: Secondary | ICD-10-CM | POA: Diagnosis not present

## 2019-05-19 DIAGNOSIS — L84 Corns and callosities: Secondary | ICD-10-CM | POA: Insufficient documentation

## 2019-05-19 DIAGNOSIS — M62838 Other muscle spasm: Secondary | ICD-10-CM | POA: Insufficient documentation

## 2019-05-19 DIAGNOSIS — M79675 Pain in left toe(s): Secondary | ICD-10-CM | POA: Diagnosis not present

## 2019-05-19 DIAGNOSIS — N390 Urinary tract infection, site not specified: Secondary | ICD-10-CM

## 2019-05-19 HISTORY — DX: Other muscle spasm: M62.838

## 2019-05-19 NOTE — Progress Notes (Signed)
Subjective:     Patient ID: Nathan Morse , male    DOB: 13-Sep-1934 , 85 y.o.   MRN: [id number]   Chief Complaint  Patient presents with  . Urinary Tract Infection    HPI  Urinary Tract Infection   This is a new problem. The current episode started in the past 7 days. The problem has been resolved. The quality of the pain is described as burning. The patient is experiencing no pain. There has been no fever. He is not sexually active. There is no history of pyelonephritis. Pertinent negatives include no chills or discharge.  Neck Pain   This is a new problem. The current episode started 1 to 4 weeks ago. The problem occurs intermittently (difficulty with turning head has stiffness). The symptoms are aggravated by twisting. Stiffness is present all day. Pertinent negatives include no fever or headaches. The treatment provided moderate relief.     Past Medical History:  Diagnosis Date  . Cancer [facility])      Family History  Adopted: Yes  Family history unknown: Yes     Current Outpatient Medications:  .  alfuzosin (UROXATRAL) 10 MG 24 hr tablet, Take 10 mg by mouth daily with breakfast., Disp: , Rfl:  .  mirabegron ER (MYRBETRIQ) 50 MG TB24 tablet, Take 50 mg by mouth daily., Disp: , Rfl:    No Known Allergies   Review of Systems  Constitutional: Negative for chills, fatigue and fever.  Respiratory: Negative.   Cardiovascular: Negative.   Gastrointestinal: Negative.   Musculoskeletal: Positive for neck pain.  Skin: Negative.   Neurological: Negative for dizziness and headaches.     Today's Vitals   05/19/19 1041  BP: 122/70  Pulse: 71  Temp: 97.8 F (36.6 C)  TempSrc: Oral  Weight: 189 lb 6.4 oz (85.9 kg)  Height: 5' 10.6" (1.793 m)  PainSc: 0-No pain   Body mass index is 26.72 kg/m.   Objective:  Physical Exam Vitals signs reviewed.  Constitutional:      Appearance: Normal appearance.  Cardiovascular:     Rate and Rhythm: Normal rate and regular rhythm.   Pulses: Normal pulses.     Heart sounds: Normal heart sounds. No murmur.  Pulmonary:     Effort: Pulmonary effort is normal.     Breath sounds: Normal breath sounds.  Skin:    Capillary Refill: Capillary refill takes less than 2 seconds.  Neurological:     General: No focal deficit present.     Mental Status: He is alert and oriented to person, place, and time.  Psychiatric:        Mood and Affect: Mood normal.        Behavior: Behavior normal.        Thought Content: Thought content normal.        Judgment: Judgment normal.         Assessment And Plan:    1. Pain of toe of left foot  Firm area to medial aspect of fifth metatarsal  Will refer to podiatry for further evaluation - Ambulatory referral to Podiatry  2. Corns and callosities  See #1 - Ambulatory referral to Podiatry  3. Muscle spasm  Muscle tension to shoulder and neck muscles  Will provide limited supply of valium to facilitate his muscles to relax.    Encouraged to do neck stretching exercises - diazepam (VALIUM) 2 MG tablet; Take 1 tablet (2 mg total) by mouth every 12 (twelve) hours as needed for anxiety.  Dispense:  20 tablet; Refill: 0  4. Urinary tract infection with hematuria, site unspecified  Will recheck urinalysis  Treated for urinary tract infection 3 weeks ago - POCT Urinalysis Dipstick (81002)   [name], FNP    THE PATIENT IS ENCOURAGED TO PRACTICE SOCIAL DISTANCING DUE TO THE COVID-19 PANDEMIC.

## 2019-05-22 ENCOUNTER — Encounter: Payer: Self-pay | Admitting: Nurse Practitioner

## 2019-05-22 MED ORDER — DIAZEPAM 2 MG PO TABS: 2.0000 mg | ORAL_TABLET | Freq: Two times a day (BID) | ORAL | 0 refills | Status: DC | PRN

## 2019-06-27 ENCOUNTER — Ambulatory Visit: Payer: Medicare Other | Admitting: Podiatry

## 2019-06-27 ENCOUNTER — Other Ambulatory Visit: Payer: Self-pay

## 2019-06-27 ENCOUNTER — Encounter: Payer: Self-pay | Admitting: Podiatry

## 2019-06-27 VITALS — BP 133/69 | HR 56 | Temp 98.4°F

## 2019-06-27 DIAGNOSIS — M79674 Pain in right toe(s): Secondary | ICD-10-CM

## 2019-06-27 DIAGNOSIS — B351 Tinea unguium: Secondary | ICD-10-CM | POA: Diagnosis not present

## 2019-06-27 DIAGNOSIS — M79675 Pain in left toe(s): Secondary | ICD-10-CM

## 2019-06-27 DIAGNOSIS — M2041 Other hammer toe(s) (acquired), right foot: Secondary | ICD-10-CM

## 2019-06-27 DIAGNOSIS — M2011 Hallux valgus (acquired), right foot: Secondary | ICD-10-CM | POA: Diagnosis not present

## 2019-06-27 DIAGNOSIS — M2042 Other hammer toe(s) (acquired), left foot: Secondary | ICD-10-CM

## 2019-06-27 DIAGNOSIS — M2012 Hallux valgus (acquired), left foot: Secondary | ICD-10-CM

## 2019-06-27 NOTE — Patient Instructions (Signed)
Corns and Calluses Corns are small areas of thickened skin that occur on the top, sides, or tip of a toe. They contain a cone-shaped core with a point that can press on a nerve below. This causes pain.  Calluses are areas of thickened skin that can occur anywhere on the body, including the hands, fingers, palms, soles of the feet, and heels. Calluses are usually larger than corns. What are the causes? Corns and calluses are caused by rubbing (friction) or pressure, such as from shoes that are too tight or do not fit properly. What increases the risk? Corns are more likely to develop in people who have misshapen toes (toe deformities), such as hammer toes. Calluses can occur with friction to any area of the skin. They are more likely to develop in people who:  Work with their hands.  Wear shoes that fit poorly, are too tight, or are high-heeled.  Have toe deformities. What are the signs or symptoms? Symptoms of a corn or callus include:  A hard growth on the skin.  Pain or tenderness under the skin.  Redness and swelling.  Increased discomfort while wearing tight-fitting shoes, if your feet are affected. If a corn or callus becomes infected, symptoms may include:  Redness and swelling that gets worse.  Pain.  Fluid, blood, or pus draining from the corn or callus. How is this diagnosed? Corns and calluses may be diagnosed based on your symptoms, your medical history, and a physical exam. How is this treated? Treatment for corns and calluses may include:  Removing the cause of the friction or pressure. This may involve: ? Changing your shoes. ? Wearing shoe inserts (orthotics) or other protective layers in your shoes, such as a corn pad. ? Wearing gloves.  Applying medicine to the skin (topical medicine) to help soften skin in the hardened, thickened areas.  Removing layers of dead skin with a file to reduce the size of the corn or callus.  Removing the corn or callus with a  scalpel or laser.  Taking antibiotic medicines, if your corn or callus is infected.  Having surgery, if a toe deformity is the cause. Follow these instructions at home:   Take over-the-counter and prescription medicines only as told by your health care provider.  If you were prescribed an antibiotic, take it as told by your health care provider. Do not stop taking it even if your condition starts to improve.  Wear shoes that fit well. Avoid wearing high-heeled shoes and shoes that are too tight or too loose.  Wear any padding, protective layers, gloves, or orthotics as told by your health care provider.  Soak your hands or feet and then use a file or pumice stone to soften your corn or callus. Do this as told by your health care provider.  Check your corn or callus every day for symptoms of infection. Contact a health care provider if you:  Notice that your symptoms do not improve with treatment.  Have redness or swelling that gets worse.  Notice that your corn or callus becomes painful.  Have fluid, blood, or pus coming from your corn or callus.  Have new symptoms. Summary  Corns are small areas of thickened skin that occur on the top, sides, or tip of a toe.  Calluses are areas of thickened skin that can occur anywhere on the body, including the hands, fingers, palms, and soles of the feet. Calluses are usually larger than corns.  Corns and calluses are caused by   rubbing (friction) or pressure, such as from shoes that are too tight or do not fit properly.  Treatment may include wearing any padding, protective layers, gloves, or orthotics as told by your health care provider. This information is not intended to replace advice given to you by your health care provider. Make sure you discuss any questions you have with your health care provider. Document Released: 09/06/2004 Document Revised: 03/23/2019 Document Reviewed: 10/14/2017 Elsevier Patient Education  2020 [employer]   Onychomycosis/Fungal Toenails  WHAT IS IT? An infection that lies within the keratin of your nail plate that is caused by a fungus.  WHY ME? Fungal infections affect all ages, sexes, races, and creeds.  There may be many factors that predispose you to a fungal infection such as age, coexisting medical conditions such as diabetes, or an autoimmune disease; stress, medications, fatigue, genetics, etc.  Bottom line: fungus thrives in a warm, moist environment and your shoes offer such a location.  IS IT CONTAGIOUS? Theoretically, yes.  You do not want to share shoes, nail clippers or files with someone who has fungal toenails.  Walking around barefoot in the same room or sleeping in the same bed is unlikely to transfer the organism.  It is important to realize, however, that fungus can spread easily from one nail to the next on the same foot.  HOW DO WE TREAT THIS?  There are several ways to treat this condition.  Treatment may depend on many factors such as age, medications, pregnancy, liver and kidney conditions, etc.  It is best to ask your doctor which options are available to you.  1. No treatment.   Unlike many other medical concerns, you can live with this condition.  However for many people this can be a painful condition and may lead to ingrown toenails or a bacterial infection.  It is recommended that you keep the nails cut short to help reduce the amount of fungal nail. 2. Topical treatment.  These range from herbal remedies to prescription strength nail lacquers.  About 40-50% effective, topicals require twice daily application for approximately 9 to 12 months or until an entirely new nail has grown out.  The most effective topicals are medical grade medications available through physicians offices. 3. Oral antifungal medications.  With an 80-90% cure rate, the most common oral medication requires 3 to 4 months of therapy and stays in your system for a year as the new nail grows out.   Oral antifungal medications do require blood work to make sure it is a safe drug for you.  A liver function panel will be performed prior to starting the medication and after the first month of treatment.  It is important to have the blood work performed to avoid any harmful side effects.  In general, this medication safe but blood work is required. 4. Laser Therapy.  This treatment is performed by applying a specialized laser to the affected nail plate.  This therapy is noninvasive, fast, and non-painful.  It is not covered by insurance and is therefore, out of pocket.  The results have been very good with a 80-95% cure rate.  The [facility] is the only practice in the area to offer this therapy. 5. Permanent Nail Avulsion.  Removing the entire nail so that a new nail will not grow back. 

## 2019-06-29 ENCOUNTER — Other Ambulatory Visit: Payer: Self-pay | Admitting: Nurse Practitioner

## 2019-06-29 DIAGNOSIS — M62838 Other muscle spasm: Secondary | ICD-10-CM

## 2019-06-29 NOTE — Telephone Encounter (Signed)
Valium refill

## 2019-06-30 NOTE — Progress Notes (Signed)
Subjective: Nathan Morse presents today referred by [name], FNP with cc of painful, discolored, thick toenails which interfere with daily activities.  Pain is aggravated when wearing enclosed shoe gear. Patient states he had a lesion between his 4th and 5th digit of his left foot which was painful. He picked at the lesion and it came off. He has brought it in an old pharmacy bottle. He states toe no longer hurts.  Past Medical History:  Diagnosis Date  . Cancer [facility])      Patient Active Problem List   Diagnosis Date Noted  . Muscle spasm 05/19/2019  . Corns and callosities 05/19/2019  . Pain of toe of left foot 05/19/2019  . Urinary tract infection with hematuria 03/22/2019  . Vitamin D deficiency 03/22/2019     Past Surgical History:  Procedure Laterality Date  . APPENDECTOMY    . TONSILLECTOMY AND ADENOIDECTOMY        Current Outpatient Medications:  .  alfuzosin (UROXATRAL) 10 MG 24 hr tablet, Take 10 mg by mouth daily with breakfast., Disp: , Rfl:  .  diazepam (VALIUM) 2 MG tablet, TAKE 1 TABLET(2 MG) BY MOUTH EVERY 12 HOURS AS NEEDED FOR ANXIETY, Disp: 20 tablet, Rfl: 0 .  linaclotide (LINZESS) 72 MCG capsule, Take by mouth., Disp: , Rfl:  .  mirabegron ER (MYRBETRIQ) 50 MG TB24 tablet, Take 50 mg by mouth daily., Disp: , Rfl:  .  STENDRA 200 MG TABS, TAKE 1/2 TABLET ONCE DAILY AS NEEDED, Disp: , Rfl:  .  Vitamin D, Ergocalciferol, (DRISDOL) 1.25 MG (50000 UT) CAPS capsule, TAKE 1 CAPSULE BY MOUTH EVERY 7 DAYS, Disp: 24 capsule, Rfl: 0   No Known Allergies   Social History   Occupational History  . Occupation: retired  Tobacco Use  . Smoking status: Never Smoker  . Smokeless tobacco: Current User    Types: Chew  Substance and Sexual Activity  . Alcohol use: No    Alcohol/week: 0.0 standard drinks  . Drug use: No  . Sexual activity: Yes     Family History  Adopted: Yes  Family history unknown: Yes     Immunization History  Administered Date(s)  Administered  . Influenza, High Dose Seasonal PF 11/17/2018  . Tdap 05/21/2012  . Zoster Recombinat (Shingrix) 06/15/2017, 08/17/2017     Review of systems: Positive Findings in bold print.  Constitutional:  chills, fatigue, fever, sweats, weight change Communication: Optometrist, sign Ecologist, hand writing, iPad/Android device Head: headaches, head injury Eyes: changes in vision, eye pain, glaucoma, cataracts, macular degeneration, diplopia, glare,  light sensitivity, eyeglasses or contacts, blindness Ears nose mouth throat: hearing impaired, hearing aids,  ringing in ears, deaf, sign language,  vertigo,   nosebleeds,  rhinitis,  cold sores, snoring, swollen glands Cardiovascular: HTN, edema, arrhythmia, pacemaker in place, defibrillator in place, chest pain/tightness, chronic anticoagulation, blood clot, heart failure, MI Peripheral Vascular: leg cramps, varicose veins, blood clots, lymphedema, varicosities Respiratory:  difficulty breathing, denies congestion, SOB, wheezing, cough, emphysema Gastrointestinal: change in appetite or weight, abdominal pain, constipation, diarrhea, nausea, vomiting, vomiting blood, change in bowel habits, abdominal pain, jaundice, rectal bleeding, hemorrhoids, GERD Genitourinary:  nocturia,  pain on urination, polyuria,  blood in urine, Foley catheter, urinary urgency, ESRD on hemodialysis Musculoskeletal: amputation, cramping, stiff joints, painful joints, decreased joint motion, fractures, OA, gout, hemiplegia, paraplegia, uses cane, wheelchair bound, uses walker, uses rollator Skin: +changes in toenails, color change, dryness, itching, mole changes,  rash, wound(s) Neurological: headaches, numbness in feet, paresthesias  in feet, burning in feet, fainting,  seizures, change in speech. denies headaches, memory problems/poor historian, cerebral palsy, weakness, paralysis, CVA, TIA Endocrine: diabetes, hypothyroidism, hyperthyroidism,  goiter, dry  mouth, flushing, heat intolerance,  cold intolerance,  excessive thirst, denies polyuria,  nocturia Hematological:  easy bleeding, excessive bleeding, easy bruising, enlarged lymph nodes, on long term blood thinner, history of past transusions Allergy/immunological:  hives, eczema, frequent infections, multiple drug allergies, seasonal allergies, transplant recipient, multiple food allergies Psychiatric:  anxiety, depression, mood disorder, suicidal ideations, hallucinations, insomnia  Objective: Vitals:   06/27/19 1514  BP: 133/69  Pulse: (!) 56  Temp: 98.4 F (36.9 C)    Vascular Examination: Capillary refill time immediate x 10 digits.  Dorsalis pedis pulses palpable b/l.   Posterior tibial pulses palpable b/l.   No digital hair x 10 digits.  Skin temperature gradient WNL b/l  Dermatological Examination: Skin with normal turgor, texture and tone b/l.  Toenails 1-5 b/l discolored, thick, dystrophic with subungual debris and pain with palpation to nailbeds due to thickness of nails.  He has brought is what appears to be a small annular lesion resembling hyperkeratotic lesion. He does have interdigital area from left lateral 4th digit PIPJ  Musculoskeletal: Muscle strength 5/5 to all LE muscle groups.  HAV with bunion b/l.  Hammertoes 2-5 b/l.  Neurological: Sensation intact with 10 gram monofilament.  Vibratory sensation intact.  Assessment: 1.  Painful onychomycosis toenails 1-5 b/l  2.  HAV with bunion b/l 3.  Hammertoes 2-5 b/l  Plan: 1. Discussed onychomycosis and treatment options.  Literature dispensed on today. 2. Patient was given a silicone toe cap for prevention of interdigital corn left 4th digit. 3. Toenails 1-5 b/l were debrided in length and girth without iatrogenic bleeding. 4. Patient to continue soft, supportive shoe gear daily. 5. Patient to report any pedal injuries to medical professional immediately. 6. Follow up 3 months.  7. Patient/POA  to call should there be a concern in the interim.

## 2019-07-12 ENCOUNTER — Other Ambulatory Visit: Payer: Self-pay

## 2019-07-12 ENCOUNTER — Ambulatory Visit (INDEPENDENT_AMBULATORY_CARE_PROVIDER_SITE_OTHER): Payer: Medicare Other

## 2019-07-12 VITALS — BP 128/78 | HR 70 | Temp 98.0°F | Ht 73.0 in | Wt 190.4 lb

## 2019-07-12 DIAGNOSIS — Z0000 Encounter for general adult medical examination without abnormal findings: Secondary | ICD-10-CM | POA: Diagnosis not present

## 2019-07-12 DIAGNOSIS — Z23 Encounter for immunization: Secondary | ICD-10-CM

## 2019-07-12 MED ORDER — PREVNAR 13 IM SUSP: 0.5000 mL | INTRAMUSCULAR | 0 refills | Status: AC

## 2019-07-12 NOTE — Progress Notes (Signed)
Subjective:   Nathan Morse is a 85 y.o. male who presents for Medicare Annual/Subsequent preventive examination.  Review of Systems:  n/a Cardiac Risk Factors include: advanced age (>17men, >74 women);male gender;sedentary lifestyle     Objective:    Vitals: BP 128/78 (BP Location: Left Arm, Patient Position: Sitting, Cuff Size: Normal)   Pulse 70   Temp 98 F (36.7 C) (Oral)   Ht 6\' 1"  (1.854 m)   Wt 190 lb 6.4 oz (86.4 kg)   SpO2 97%   BMI 25.12 kg/m   Body mass index is 25.12 kg/m.  Advanced Directives 07/12/2019 11/17/2018 05/28/2017  Does Patient Have a Medical Advance Directive? No Yes No  Does patient want to make changes to medical advance directive? - No - Patient declined -  Would patient like information on creating a medical advance directive? No - Patient declined - -    Tobacco Social History   Tobacco Use  Smoking Status Never Smoker  Smokeless Tobacco Current User  . Types: Chew  Tobacco Comment   wants to quit, but hard     Ready to quit: Yes Counseling given: Yes Comment: wants to quit, but hard   Clinical Intake:  Pre-visit preparation completed: Yes  Pain : No/denies pain     Nutritional Status: BMI 25 -29 Overweight Nutritional Risks: None Diabetes: No  How often do you need to have someone help you when you read instructions, pamphlets, or other written materials from your doctor or pharmacy?: 5 - Always What is the last grade level you completed in school?: 4th grade  Interpreter Needed?: No  Information entered by :: NAllen LPN  Past Medical History:  Diagnosis Date  . Cancer [facility])    Past Surgical History:  Procedure Laterality Date  . APPENDECTOMY    . TONSILLECTOMY AND ADENOIDECTOMY     Family History  Adopted: Yes  Family history unknown: Yes   Social History   Socioeconomic History  . Marital status: Married    Spouse name: Not on file  . Number of children: Not on file  . Years of education: Not on file  .  Highest education level: Not on file  Occupational History  . Occupation: retired  Scientific laboratory technician  . Financial resource strain: Not hard at all  . Food insecurity    Worry: Never true    Inability: Never true  . Transportation needs    Medical: No    Non-medical: No  Tobacco Use  . Smoking status: Never Smoker  . Smokeless tobacco: Current User    Types: Chew  . Tobacco comment: wants to quit, but hard  Substance and Sexual Activity  . Alcohol use: No    Alcohol/week: 0.0 standard drinks  . Drug use: No  . Sexual activity: Yes  Lifestyle  . Physical activity    Days per week: 0 days    Minutes per session: 0 min  . Stress: Not at all  Relationships  . Social Herbalist on phone: Not on file    Gets together: Not on file    Attends religious service: Not on file    Active member of club or organization: Not on file    Attends meetings of clubs or organizations: Not on file    Relationship status: Not on file  Other Topics Concern  . Not on file  Social History Narrative  . Not on file    Outpatient Encounter Medications as of 07/12/2019  Medication  Sig  . alfuzosin (UROXATRAL) 10 MG 24 hr tablet Take 10 mg by mouth daily with breakfast.  . diazepam (VALIUM) 2 MG tablet TAKE 1 TABLET(2 MG) BY MOUTH EVERY 12 HOURS AS NEEDED FOR ANXIETY  . linaclotide (LINZESS) 72 MCG capsule Take by mouth.  . mirabegron ER (MYRBETRIQ) 50 MG TB24 tablet Take 50 mg by mouth daily.  Marland Kitchen STENDRA 200 MG TABS TAKE 1/2 TABLET ONCE DAILY AS NEEDED  . Vitamin D, Ergocalciferol, (DRISDOL) 1.25 MG (50000 UT) CAPS capsule TAKE 1 CAPSULE BY MOUTH EVERY 7 DAYS   No facility-administered encounter medications on file as of 07/12/2019.     Activities of Daily Living In your present state of health, do you have any difficulty performing the following activities: 07/12/2019 11/17/2018  Hearing? N N  Vision? N N  Difficulty concentrating or making decisions? Y Y  Comment forgetfulness forgetful   Walking or climbing stairs? Y N  Comment uses the rails -  Dressing or bathing? N N  Doing errands, shopping? N N  Preparing Food and eating ? N N  Using the Toilet? N N  In the past six months, have you accidently leaked urine? Y Y  Comment - occasional leakage  Do you have problems with loss of bowel control? N N  Managing your Medications? N N  Managing your Finances? N N  Housekeeping or managing your Housekeeping? N N  Some recent data might be hidden    Patient Care Team: [name], FNP as PCP - General (General Practice)   Assessment:   This is a routine wellness examination for Nathan Morse.  Exercise Activities and Dietary recommendations Current Exercise Habits: The patient does not participate in regular exercise at present  Goals    . DIET - INCREASE WATER INTAKE (pt-stated)    . DIET - INCREASE WATER INTAKE     07/12/2019, wants to increase to at least 5 bottles daily       Fall Risk Fall Risk  07/12/2019 05/19/2019 11/17/2018 10/02/2015  Falls in the past year? 0 0 1 Yes  Number falls in past yr: - - 1 1  Comment - - lost balance both times -  Injury with Fall? - - 0 -  Risk for fall due to : Medication side effect - History of fall(s) -  Follow up Falls evaluation completed;Education provided;Falls prevention discussed - Falls prevention discussed -   Is the patient's home free of loose throw rugs in walkways, pet beds, electrical cords, etc?   yes      Grab bars in the bathroom? yes      Handrails on the stairs?   yes      Adequate lighting?   yes  Timed Get Up and Go Performed: n/a  Depression Screen PHQ 2/9 Scores 07/12/2019 05/19/2019 11/17/2018 10/02/2015  PHQ - 2 Score 0 0 0 0  PHQ- 9 Score 9 - 9 -    Cognitive Function     6CIT Screen 07/12/2019 11/17/2018  What Year? 0 points 0 points  What month? 0 points 0 points  What time? 0 points 0 points  Count back from 20 2 points 2 points  Months in reverse 4 points 4 points  Repeat phrase 0 points 6  points  Total Score 6 12    Immunization History  Administered Date(s) Administered  . Influenza, High Dose Seasonal PF 11/17/2018  . Tdap 05/21/2012  . Zoster Recombinat (Shingrix) 06/15/2017, 08/17/2017    Qualifies for Shingles Vaccine?  yes  Screening Tests Health Maintenance  Topic Date Due  . PNA vac Low Risk Adult (1 of 2 - PCV13) 11/18/2019 (Originally 05/19/2001)  . INFLUENZA VACCINE  07/16/2019  . TETANUS/TDAP  05/21/2022   Cancer Screenings: Lung: Low Dose CT Chest recommended if Age 72-80 years, 30 pack-year currently smoking OR have quit w/in 15years. Patient does not qualify. Colorectal: not required  Additional Screenings:  Hepatitis C Screening:n/a      Plan:    6 CIT was 6. Wants to increase water intake to 5 bottles daily.  I have personally reviewed and noted the following in the patient's chart:   . Medical and social history . Use of alcohol, tobacco or illicit drugs  . Current medications and supplements . Functional ability and status . Nutritional status . Physical activity . Advanced directives . List of other physicians . Hospitalizations, surgeries, and ER visits in previous 12 months . Vitals . Screenings to include cognitive, depression, and falls . Referrals and appointments  In addition, I have reviewed and discussed with patient certain preventive protocols, quality metrics, and best practice recommendations. A written personalized care plan for preventive services as well as general preventive health recommendations were provided to patient.     Kellie Simmering, LPN  [id number]/[id number]

## 2019-07-12 NOTE — Patient Instructions (Signed)
Nathan Morse , Thank you for taking time to come for your Medicare Wellness Visit. I appreciate your ongoing commitment to your health goals. Please review the following plan we discussed and let me know if I can assist you in the future.   Screening recommendations/referrals: Colonoscopy: 01/2010 Recommended yearly ophthalmology/optometry visit for glaucoma screening and checkup Recommended yearly dental visit for hygiene and checkup  Vaccinations: Influenza vaccine: 11/2018 Pneumococcal vaccine: sent to pharmacy Tdap vaccine: 05/2012 Shingles vaccine: 08/2017    Advanced directives: Advance directive discussed with you today. Even though you declined this today please call our office should you change your mind and we can give you the proper paperwork for you to fill out.   Conditions/risks identified: overweight, smokeless tobacco  Next appointment: 11/24/2019 at 10:45  Preventive Care 83 Years and Older, Male Preventive care refers to lifestyle choices and visits with your health care provider that can promote health and wellness. What does preventive care include?  A yearly physical exam. This is also called an annual well check.  Dental exams once or twice a year.  Routine eye exams. Ask your health care provider how often you should have your eyes checked.  Personal lifestyle choices, including:  Daily care of your teeth and gums.  Regular physical activity.  Eating a healthy diet.  Avoiding tobacco and drug use.  Limiting alcohol use.  Practicing safe sex.  Taking low doses of aspirin every day.  Taking vitamin and mineral supplements as recommended by your health care provider. What happens during an annual well check? The services and screenings done by your health care provider during your annual well check will depend on your age, overall health, lifestyle risk factors, and family history of disease. Counseling  Your health care provider may ask you questions  about your:  Alcohol use.  Tobacco use.  Drug use.  Emotional well-being.  Home and relationship well-being.  Sexual activity.  Eating habits.  History of falls.  Memory and ability to understand (cognition).  Work and work Statistician. Screening  You may have the following tests or measurements:  Height, weight, and BMI.  Blood pressure.  Lipid and cholesterol levels. These may be checked every 5 years, or more frequently if you are over 19 years old.  Skin check.  Lung cancer screening. You may have this screening every year starting at age 83 if you have a 30-pack-year history of smoking and currently smoke or have quit within the past 15 years.  Fecal occult blood test (FOBT) of the stool. You may have this test every year starting at age 83.  Flexible sigmoidoscopy or colonoscopy. You may have a sigmoidoscopy every 5 years or a colonoscopy every 10 years starting at age 83.  Prostate cancer screening. Recommendations will vary depending on your family history and other risks.  Hepatitis C blood test.  Hepatitis B blood test.  Sexually transmitted disease (STD) testing.  Diabetes screening. This is done by checking your blood sugar (glucose) after you have not eaten for a while (fasting). You may have this done every 1-3 years.  Abdominal aortic aneurysm (AAA) screening. You may need this if you are a current or former smoker.  Osteoporosis. You may be screened starting at age 83 if you are at high risk. Talk with your health care provider about your test results, treatment options, and if necessary, the need for more tests. Vaccines  Your health care provider may recommend certain vaccines, such as:  Influenza vaccine. This  is recommended every year.  Tetanus, diphtheria, and acellular pertussis (Tdap, Td) vaccine. You may need a Td booster every 10 years.  Zoster vaccine. You may need this after age 10.  Pneumococcal 13-valent conjugate (PCV13)  vaccine. One dose is recommended after age 54.  Pneumococcal polysaccharide (PPSV23) vaccine. One dose is recommended after age 44. Talk to your health care provider about which screenings and vaccines you need and how often you need them. This information is not intended to replace advice given to you by your health care provider. Make sure you discuss any questions you have with your health care provider. Document Released: 12/28/2015 Document Revised: 08/20/2016 Document Reviewed: 10/02/2015 Elsevier Interactive Patient Education  2017 [place] Prevention in the Home Falls can cause injuries. They can happen to people of all ages. There are many things you can do to make your home safe and to help prevent falls. What can I do on the outside of my home?  Regularly fix the edges of walkways and driveways and fix any cracks.  Remove anything that might make you trip as you walk through a door, such as a raised step or threshold.  Trim any bushes or trees on the path to your home.  Use bright outdoor lighting.  Clear any walking paths of anything that might make someone trip, such as rocks or tools.  Regularly check to see if handrails are loose or broken. Make sure that both sides of any steps have handrails.  Any raised decks and porches should have guardrails on the edges.  Have any leaves, snow, or ice cleared regularly.  Use sand or salt on walking paths during winter.  Clean up any spills in your garage right away. This includes oil or grease spills. What can I do in the bathroom?  Use night lights.  Install grab bars by the toilet and in the tub and shower. Do not use towel bars as grab bars.  Use non-skid mats or decals in the tub or shower.  If you need to sit down in the shower, use a plastic, non-slip stool.  Keep the floor dry. Clean up any water that spills on the floor as soon as it happens.  Remove soap buildup in the tub or shower regularly.   Attach bath mats securely with double-sided non-slip rug tape.  Do not have throw rugs and other things on the floor that can make you trip. What can I do in the bedroom?  Use night lights.  Make sure that you have a light by your bed that is easy to reach.  Do not use any sheets or blankets that are too big for your bed. They should not hang down onto the floor.  Have a firm chair that has side arms. You can use this for support while you get dressed.  Do not have throw rugs and other things on the floor that can make you trip. What can I do in the kitchen?  Clean up any spills right away.  Avoid walking on wet floors.  Keep items that you use a lot in easy-to-reach places.  If you need to reach something above you, use a strong step stool that has a grab bar.  Keep electrical cords out of the way.  Do not use floor polish or wax that makes floors slippery. If you must use wax, use non-skid floor wax.  Do not have throw rugs and other things on the floor that can make you  trip. What can I do with my stairs?  Do not leave any items on the stairs.  Make sure that there are handrails on both sides of the stairs and use them. Fix handrails that are broken or loose. Make sure that handrails are as long as the stairways.  Check any carpeting to make sure that it is firmly attached to the stairs. Fix any carpet that is loose or worn.  Avoid having throw rugs at the top or bottom of the stairs. If you do have throw rugs, attach them to the floor with carpet tape.  Make sure that you have a light switch at the top of the stairs and the bottom of the stairs. If you do not have them, ask someone to add them for you. What else can I do to help prevent falls?  Wear shoes that:  Do not have high heels.  Have rubber bottoms.  Are comfortable and fit you well.  Are closed at the toe. Do not wear sandals.  If you use a stepladder:  Make sure that it is fully opened. Do not climb  a closed stepladder.  Make sure that both sides of the stepladder are locked into place.  Ask someone to hold it for you, if possible.  Clearly mark and make sure that you can see:  Any grab bars or handrails.  First and last steps.  Where the edge of each step is.  Use tools that help you move around (mobility aids) if they are needed. These include:  Canes.  Walkers.  Scooters.  Crutches.  Turn on the lights when you go into a dark area. Replace any light bulbs as soon as they burn out.  Set up your furniture so you have a clear path. Avoid moving your furniture around.  If any of your floors are uneven, fix them.  If there are any pets around you, be aware of where they are.  Review your medicines with your doctor. Some medicines can make you feel dizzy. This can increase your chance of falling. Ask your doctor what other things that you can do to help prevent falls. This information is not intended to replace advice given to you by your health care provider. Make sure you discuss any questions you have with your health care provider. Document Released: 09/27/2009 Document Revised: 05/08/2016 Document Reviewed: 01/05/2015 Elsevier Interactive Patient Education  2017 [employer].

## 2019-08-25 ENCOUNTER — Encounter: Payer: Self-pay | Admitting: Nurse Practitioner

## 2019-09-28 ENCOUNTER — Encounter: Payer: Self-pay | Admitting: Podiatry

## 2019-09-28 ENCOUNTER — Ambulatory Visit: Payer: Medicare Other | Admitting: Podiatry

## 2019-09-28 ENCOUNTER — Other Ambulatory Visit: Payer: Self-pay

## 2019-09-28 DIAGNOSIS — B351 Tinea unguium: Secondary | ICD-10-CM

## 2019-09-28 DIAGNOSIS — M79674 Pain in right toe(s): Secondary | ICD-10-CM | POA: Diagnosis not present

## 2019-09-28 DIAGNOSIS — M79675 Pain in left toe(s): Secondary | ICD-10-CM

## 2019-09-28 NOTE — Patient Instructions (Signed)

## 2019-09-29 ENCOUNTER — Encounter: Payer: Self-pay | Admitting: Nurse Practitioner

## 2019-09-29 ENCOUNTER — Other Ambulatory Visit: Payer: Self-pay

## 2019-09-29 ENCOUNTER — Ambulatory Visit (INDEPENDENT_AMBULATORY_CARE_PROVIDER_SITE_OTHER): Payer: Medicare Other | Admitting: Nurse Practitioner

## 2019-09-29 ENCOUNTER — Ambulatory Visit: Payer: Medicare Other | Admitting: Nurse Practitioner

## 2019-09-29 VITALS — BP 116/76 | HR 68 | Temp 98.3°F | Ht 72.4 in | Wt 189.4 lb

## 2019-09-29 DIAGNOSIS — R143 Flatulence: Secondary | ICD-10-CM

## 2019-09-29 DIAGNOSIS — K5900 Constipation, unspecified: Secondary | ICD-10-CM

## 2019-09-29 MED ORDER — LINACLOTIDE 72 MCG PO CAPS: 72.0000 ug | ORAL_CAPSULE | Freq: Every day | ORAL | 1 refills | Status: DC

## 2019-09-29 NOTE — Patient Instructions (Signed)
   Increase your intake of high fiber foods like leafy vegetables  Avoid spicy foods.

## 2019-09-29 NOTE — Progress Notes (Signed)
Subjective:     Patient ID: Nathan Morse , male    DOB: 1934-01-14 , 85 y.o.   MRN: [id number]   Chief Complaint  Patient presents with  . Abdominal Pain    patient stated he has been having some problems with his stomach he has been having a lot of gas and when he eats his nose runs    HPI  He is having an increase in gas production.  He is eating at [employer] daily - fish, chicken, rice and fat back.  He will eat a sausage, egg and cheese biscuit.  He will have a bowel movement 2 times a week.      Past Medical History:  Diagnosis Date  . Cancer [facility])      Family History  Adopted: Yes  Family history unknown: Yes     Current Outpatient Medications:  .  mirabegron ER (MYRBETRIQ) 50 MG TB24 tablet, Take 50 mg by mouth daily., Disp: , Rfl:  .  sildenafil (REVATIO) 20 MG tablet, TAKE 5 TABLETS BY MOUTH AS NEEDED, Disp: , Rfl:  .  Vitamin D, Ergocalciferol, (DRISDOL) 1.25 MG (50000 UT) CAPS capsule, TAKE 1 CAPSULE BY MOUTH EVERY 7 DAYS, Disp: 24 capsule, Rfl: 0 .  alfuzosin (UROXATRAL) 10 MG 24 hr tablet, Take 10 mg by mouth daily with breakfast., Disp: , Rfl:  .  diazepam (VALIUM) 2 MG tablet, TAKE 1 TABLET(2 MG) BY MOUTH EVERY 12 HOURS AS NEEDED FOR ANXIETY (Patient not taking: Reported on 09/29/2019), Disp: 20 tablet, Rfl: 0 .  linaclotide (LINZESS) 72 MCG capsule, Take by mouth., Disp: , Rfl:  .  STENDRA 200 MG TABS, TAKE 1/2 TABLET ONCE DAILY AS NEEDED, Disp: , Rfl:  .  tobramycin-dexamethasone (TOBRADEX) ophthalmic solution, SHAKE LQ AND INT 1 GTT IN OS QID FOR 5 DAYS, Disp: , Rfl:  .  vardenafil (LEVITRA) 20 MG tablet, Take 20 mg by mouth as needed., Disp: , Rfl:    No Known Allergies   Review of Systems  Constitutional: Negative.   Respiratory: Negative.   Cardiovascular: Negative.  Negative for chest pain, palpitations and leg swelling.  Gastrointestinal: Positive for constipation. Negative for abdominal distention, abdominal pain, diarrhea, nausea, rectal pain  and vomiting.       Increased flatulence  Neurological: Negative for dizziness and headaches.  Psychiatric/Behavioral: Negative.      Today's Vitals   09/29/19 1411  BP: 116/76  Pulse: 68  Temp: 98.3 F (36.8 C)  TempSrc: Oral  Weight: 189 lb 6.4 oz (85.9 kg)  Height: 6' 0.4" (1.839 m)  PainSc: 0-No pain   Body mass index is 25.4 kg/m.   Objective:  Physical Exam Constitutional:      Appearance: He is well-developed.  Abdominal:     General: Bowel sounds are normal.     Palpations: Abdomen is soft.  Neurological:     General: No focal deficit present.     Mental Status: He is alert.         Assessment And Plan:     1. Constipation, unspecified constipation type  Over the counter miralax is not effective  I will try him on linzess once a day - linaclotide (LINZESS) 72 MCG capsule; Take 1 capsule (72 mcg total) by mouth daily before breakfast.  Dispense: 90 capsule; Refill: 1  2. Flatulence  I have advised him to take a probiotic daily as well and to eat low gas causing foods. - linaclotide (LINZESS) 72 MCG capsule; Take 1  capsule (72 mcg total) by mouth daily before breakfast.  Dispense: 90 capsule; Refill: 1   [name], FNP    THE PATIENT IS ENCOURAGED TO PRACTICE SOCIAL DISTANCING DUE TO THE COVID-19 PANDEMIC.

## 2019-10-02 NOTE — Progress Notes (Signed)
Subjective:  Nathan Morse presents to clinic today with cc of  painful, thick, discolored, elongated toenails 1-5 b/l that become tender and cannot cut because of thickness. Pain is aggravated when wearing enclosed shoe gear.  Current Outpatient Medications on File Prior to Visit  Medication Sig Dispense Refill  . alfuzosin (UROXATRAL) 10 MG 24 hr tablet Take 10 mg by mouth daily with breakfast.    . diazepam (VALIUM) 2 MG tablet TAKE 1 TABLET(2 MG) BY MOUTH EVERY 12 HOURS AS NEEDED FOR ANXIETY (Patient not taking: Reported on 09/29/2019) 20 tablet 0  . mirabegron ER (MYRBETRIQ) 50 MG TB24 tablet Take 50 mg by mouth daily.    . sildenafil (REVATIO) 20 MG tablet TAKE 5 TABLETS BY MOUTH AS NEEDED    . STENDRA 200 MG TABS TAKE 1/2 TABLET ONCE DAILY AS NEEDED    . tobramycin-dexamethasone (TOBRADEX) ophthalmic solution SHAKE LQ AND INT 1 GTT IN OS QID FOR 5 DAYS    . vardenafil (LEVITRA) 20 MG tablet Take 20 mg by mouth as needed.    . Vitamin D, Ergocalciferol, (DRISDOL) 1.25 MG (50000 UT) CAPS capsule TAKE 1 CAPSULE BY MOUTH EVERY 7 DAYS 24 capsule 0   No current facility-administered medications on file prior to visit.      No Known Allergies   Objective: There were no vitals filed for this visit.  Physical Examination:  Vascular Examination: Capillary refill time immediate x 10 digits.  Palpable DP/PT pulses b/l.  Digital hair absent b/l.  No edema noted b/l.  Skin temperature gradient WNL b/l.  Dermatological Examination: Skin with normal turgor, texture and tone b/l.  No open wounds b/l.  No interdigital macerations noted b/l.  Elongated, thick, discolored brittle toenails with subungual debris and pain on dorsal palpation of nailbeds 1-5 b/l.  Musculoskeletal Examination: Muscle strength 5/5 to all muscle groups b/l.  HAV with bunion b/l. Hammertoes 2-5 b/l.  No pain, crepitus or joint discomfort with active/passive ROM.  Neurological Examination: Sensation  intact 5/5 b/l with 10 gram monofilament.  Assessment: Mycotic nail infection with pain 1-5 b/l  Plan: 1. Toenails 1-5 b/l were debrided in length and girth without iatrogenic laceration. 2.  Continue soft, supportive shoe gear daily. 3.  Report any pedal injuries to medical professional. 4.  Follow up 3 months. 5.  Patient/POA to call should there be a question/concern in there interim.

## 2019-10-17 ENCOUNTER — Other Ambulatory Visit: Payer: Self-pay

## 2019-10-17 ENCOUNTER — Encounter: Payer: Self-pay | Admitting: Nurse Practitioner

## 2019-10-17 ENCOUNTER — Ambulatory Visit (INDEPENDENT_AMBULATORY_CARE_PROVIDER_SITE_OTHER): Payer: Medicare Other | Admitting: Nurse Practitioner

## 2019-10-17 VITALS — BP 160/90 | HR 63 | Temp 98.0°F | Ht 70.8 in | Wt 191.2 lb

## 2019-10-17 DIAGNOSIS — M549 Dorsalgia, unspecified: Secondary | ICD-10-CM | POA: Diagnosis not present

## 2019-10-17 MED ORDER — DICLOFENAC SODIUM 1 % TD GEL: 2.0000 g | Freq: Four times a day (QID) | TRANSDERMAL | 1 refills | Status: DC

## 2019-10-17 NOTE — Patient Instructions (Signed)
Acute Back Pain, Adult Acute back pain is sudden and usually short-lived. It is often caused by an injury to the muscles and tissues in the back. The injury may result from:  A muscle or ligament getting overstretched or torn (strained). Ligaments are tissues that connect bones to each other. Lifting something improperly can cause a back strain.  Wear and tear (degeneration) of the spinal disks. Spinal disks are circular tissue that provides cushioning between the bones of the spine (vertebrae).  Twisting motions, such as while playing sports or doing yard work.  A hit to the back.  Arthritis. You may have a physical exam, lab tests, and imaging tests to find the cause of your pain. Acute back pain usually goes away with rest and home care. Follow these instructions at home: Managing pain, stiffness, and swelling  Take over-the-counter and prescription medicines only as told by your health care provider.  Your health care provider may recommend applying ice during the first 24-48 hours after your pain starts. To do this: ? Put ice in a plastic bag. ? Place a towel between your skin and the bag. ? Leave the ice on for 20 minutes, 2-3 times a day.  If directed, apply heat to the affected area as often as told by your health care provider. Use the heat source that your health care provider recommends, such as a moist heat pack or a heating pad. ? Place a towel between your skin and the heat source. ? Leave the heat on for 20-30 minutes. ? Remove the heat if your skin turns bright red. This is especially important if you are unable to feel pain, heat, or cold. You have a greater risk of getting burned. Activity   Do not stay in bed. Staying in bed for more than 1-2 days can delay your recovery.  Sit up and stand up straight. Avoid leaning forward when you sit, or hunching over when you stand. ? If you work at a desk, sit close to it so you do not need to lean over. Keep your chin tucked  in. Keep your neck drawn back, and keep your elbows bent at a right angle. Your arms should look like the letter "L." ? Sit high and close to the steering wheel when you drive. Add lower back (lumbar) support to your car seat, if needed.  Take short walks on even surfaces as soon as you are able. Try to increase the length of time you walk each day.  Do not sit, drive, or stand in one place for more than 30 minutes at a time. Sitting or standing for long periods of time can put stress on your back.  Do not drive or use heavy machinery while taking prescription pain medicine.  Use proper lifting techniques. When you bend and lift, use positions that put less stress on your back: ? Bend your knees. ? Keep the load close to your body. ? Avoid twisting.  Exercise regularly as told by your health care provider. Exercising helps your back heal faster and helps prevent back injuries by keeping muscles strong and flexible.  Work with a physical therapist to make a safe exercise program, as recommended by your health care provider. Do any exercises as told by your physical therapist. Lifestyle  Maintain a healthy weight. Extra weight puts stress on your back and makes it difficult to have good posture.  Avoid activities or situations that make you feel anxious or stressed. Stress and anxiety increase muscle   tension and can make back pain worse. Learn ways to manage anxiety and stress, such as through exercise. General instructions  Sleep on a firm mattress in a comfortable position. Try lying on your side with your knees slightly bent. If you lie on your back, put a pillow under your knees.  Follow your treatment plan as told by your health care provider. This may include: ? Cognitive or behavioral therapy. ? Acupuncture or massage therapy. ? Meditation or yoga. Contact a health care provider if:  You have pain that is not relieved with rest or medicine.  You have increasing pain going down  into your legs or buttocks.  Your pain does not improve after 2 weeks.  You have pain at night.  You lose weight without trying.  You have a fever or chills. Get help right away if:  You develop new bowel or bladder control problems.  You have unusual weakness or numbness in your arms or legs.  You develop nausea or vomiting.  You develop abdominal pain.  You feel faint. Summary  Acute back pain is sudden and usually short-lived.  Use proper lifting techniques. When you bend and lift, use positions that put less stress on your back.  Take over-the-counter and prescription medicines and apply heat or ice as directed by your health care provider. This information is not intended to replace advice given to you by your health care provider. Make sure you discuss any questions you have with your health care provider. Document Released: 12/01/2005 Document Revised: 03/22/2019 Document Reviewed: 07/15/2017 Elsevier Patient Education  2020 [employer]  

## 2019-10-17 NOTE — Progress Notes (Signed)
Subjective:     Patient ID: Nathan Morse , male    DOB: [birth year] , 85 y.o.   MRN: [id number]   Chief Complaint  Patient presents with  . Back Pain    patient stated he is having some back pain for the past week    HPI  He had an appendectomy 40 years ago.  Wt Readings from Last 3 Encounters: 10/17/19 : 191 lb 3.2 oz (86.7 kg) 09/29/19 : 189 lb 6.4 oz (85.9 kg) 07/12/19 : 190 lb 6.4 oz (86.4 kg)   Back Pain This is a new problem. The current episode started in the past 7 days. The problem occurs constantly. The problem has been gradually worsening since onset. Pain location: lateral back pain. The quality of the pain is described as aching (right lateral area of his back). The pain does not radiate. Exacerbated by: movement. Pertinent negatives include no abdominal pain, bladder incontinence, chest pain or headaches. He has tried heat for the symptoms. The treatment provided no relief.     Past Medical History:  Diagnosis Date  . Cancer [facility])      Family History  Adopted: Yes  Family history unknown: Yes     Current Outpatient Medications:  .  alfuzosin (UROXATRAL) 10 MG 24 hr tablet, Take 10 mg by mouth daily with breakfast., Disp: , Rfl:  .  linaclotide (LINZESS) 72 MCG capsule, Take 1 capsule (72 mcg total) by mouth daily before breakfast., Disp: 90 capsule, Rfl: 1 .  mirabegron ER (MYRBETRIQ) 50 MG TB24 tablet, Take 50 mg by mouth daily., Disp: , Rfl:  .  sildenafil (REVATIO) 20 MG tablet, TAKE 5 TABLETS BY MOUTH AS NEEDED, Disp: , Rfl:  .  STENDRA 200 MG TABS, TAKE 1/2 TABLET ONCE DAILY AS NEEDED, Disp: , Rfl:  .  tobramycin-dexamethasone (TOBRADEX) ophthalmic solution, SHAKE LQ AND INT 1 GTT IN OS QID FOR 5 DAYS, Disp: , Rfl:  .  vardenafil (LEVITRA) 20 MG tablet, Take 20 mg by mouth as needed., Disp: , Rfl:  .  Vitamin D, Ergocalciferol, (DRISDOL) 1.25 MG (50000 UT) CAPS capsule, TAKE 1 CAPSULE BY MOUTH EVERY 7 DAYS, Disp: 24 capsule, Rfl: 0 .  diazepam (VALIUM) 2  MG tablet, TAKE 1 TABLET(2 MG) BY MOUTH EVERY 12 HOURS AS NEEDED FOR ANXIETY (Patient not taking: Reported on 09/29/2019), Disp: 20 tablet, Rfl: 0   No Known Allergies   Review of Systems  Constitutional: Negative.   Respiratory: Negative.   Cardiovascular: Negative.  Negative for chest pain, palpitations and leg swelling.  Gastrointestinal: Negative for abdominal pain.  Genitourinary: Negative for bladder incontinence.  Musculoskeletal: Positive for back pain.  Neurological: Negative.  Negative for dizziness and headaches.  Psychiatric/Behavioral: Negative.      Today's Vitals   10/17/19 1045  BP: (!) 160/90  Pulse: 63  Temp: 98 F (36.7 C)  TempSrc: Oral  Weight: 191 lb 3.2 oz (86.7 kg)  Height: 5' 10.8" (1.798 m)   Body mass index is 26.82 kg/m.   Objective:  Physical Exam Constitutional:      Appearance: Normal appearance.  Cardiovascular:     Rate and Rhythm: Normal rate and regular rhythm.     Pulses: Normal pulses.     Heart sounds: Normal heart sounds. No murmur.  Pulmonary:     Effort: Pulmonary effort is normal. No respiratory distress.     Breath sounds: Normal breath sounds.  Musculoskeletal:        General: Tenderness (right low back)  present.  Skin:    General: Skin is warm and dry.     Capillary Refill: Capillary refill takes less than 2 seconds.  Neurological:     General: No focal deficit present.     Mental Status: He is alert and oriented to person, place, and time.  Psychiatric:        Mood and Affect: Mood normal.        Behavior: Behavior normal.        Thought Content: Thought content normal.        Judgment: Judgment normal.         Assessment And Plan:      1. Acute right-sided back pain, unspecified back location  Mild tenderness right low back  Urinalysis is normal  Will check GFR for kidney function status. - BMP8+eGFR   [name], FNP    THE PATIENT IS ENCOURAGED TO PRACTICE SOCIAL DISTANCING DUE TO THE COVID-19  PANDEMIC.

## 2019-10-18 LAB — BMP8+EGFR
BUN/Creatinine Ratio: 8 — ABNORMAL LOW (ref 10–24)
BUN: 11 mg/dL (ref 8–27)
CO2: 26 mmol/L (ref 20–29)
Calcium: 9 mg/dL (ref 8.6–10.2)
Chloride: 103 mmol/L (ref 96–106)
Creatinine, Ser: 1.39 mg/dL — ABNORMAL HIGH (ref 0.76–1.27)
GFR calc Af Amer: 54 mL/min/{1.73_m2} — ABNORMAL LOW (ref >59)
GFR calc non Af Amer: 47 mL/min/{1.73_m2} — ABNORMAL LOW (ref >59)
Glucose: 93 mg/dL (ref 65–99)
Potassium: 4 mmol/L (ref 3.5–5.2)
Sodium: 144 mmol/L (ref 134–144)

## 2019-11-24 ENCOUNTER — Encounter: Payer: Self-pay | Admitting: Nurse Practitioner

## 2019-11-24 ENCOUNTER — Other Ambulatory Visit: Payer: Self-pay

## 2019-11-24 ENCOUNTER — Ambulatory Visit: Payer: Medicare Other | Admitting: Nurse Practitioner

## 2019-11-24 ENCOUNTER — Ambulatory Visit: Payer: Medicare Other

## 2019-11-24 VITALS — BP 112/64 | HR 72 | Temp 98.3°F | Ht 68.8 in | Wt 185.8 lb

## 2019-11-24 DIAGNOSIS — Z23 Encounter for immunization: Secondary | ICD-10-CM

## 2019-11-24 DIAGNOSIS — Z8546 Personal history of malignant neoplasm of prostate: Secondary | ICD-10-CM

## 2019-11-24 DIAGNOSIS — N1831 Chronic kidney disease, stage 3a: Secondary | ICD-10-CM

## 2019-11-24 DIAGNOSIS — K5900 Constipation, unspecified: Secondary | ICD-10-CM | POA: Diagnosis not present

## 2019-11-24 DIAGNOSIS — Z0000 Encounter for general adult medical examination without abnormal findings: Secondary | ICD-10-CM

## 2019-11-24 DIAGNOSIS — E559 Vitamin D deficiency, unspecified: Secondary | ICD-10-CM

## 2019-11-24 LAB — POCT URINALYSIS DIPSTICK
Bilirubin, UA: NEGATIVE
Blood, UA: NEGATIVE
Glucose, UA: NEGATIVE
Ketones, UA: NEGATIVE
Leukocytes, UA: NEGATIVE
Nitrite, UA: NEGATIVE
Protein, UA: NEGATIVE
Spec Grav, UA: 1.025 (ref 1.010–1.025)
Urobilinogen, UA: 0.2 E.U./dL
pH, UA: 5.5 (ref 5.0–8.0)

## 2019-11-24 MED ORDER — PNEUMOCOCCAL 13-VAL CONJ VACC IM SUSP: 0.5000 mL | INTRAMUSCULAR | 0 refills | Status: AC

## 2019-11-24 NOTE — Progress Notes (Signed)
This visit occurred during the SARS-CoV-2 public health emergency.  Safety protocols were in place, including screening questions prior to the visit, additional usage of staff PPE, and extensive cleaning of exam room while observing appropriate contact time as indicated for disinfecting solutions.  Subjective:     Patient ID: Nathan Morse , male    DOB: 05/29/1934 , 85 y.o.   MRN: [id number]   Chief Complaint  Patient presents with  . Annual Exam    HPI  Here for HM    Men's preventive visit. Patient Health Questionnaire (PHQ-2) is    Office Visit from 11/24/2019 in [facility]  PHQ-2 Total Score  0     Patient is on a regular diet, eats at [employer] daily and [employer]. Marital status: Widowed. Relevant history for alcohol use is:  Social History   Substance and Sexual Activity  Alcohol Use No  . Alcohol/week: 0.0 standard drinks   Relevant history for tobacco use is:  Social History   Tobacco Use  Smoking Status Never Smoker  Smokeless Tobacco Current User  . Types: Chew  Tobacco Comment   wants to quit, but hard   Works in the yard for exercise.   Past Medical History:  Diagnosis Date  . Cancer [facility])      Family History  Adopted: Yes  Family history unknown: Yes     Current Outpatient Medications:  .  linaclotide (LINZESS) 72 MCG capsule, Take 1 capsule (72 mcg total) by mouth daily before breakfast., Disp: 90 capsule, Rfl: 1 .  mirabegron ER (MYRBETRIQ) 50 MG TB24 tablet, Take 50 mg by mouth daily., Disp: , Rfl:  .  vardenafil (LEVITRA) 20 MG tablet, Take 20 mg by mouth as needed., Disp: , Rfl:  .  Vitamin D, Ergocalciferol, (DRISDOL) 1.25 MG (50000 UT) CAPS capsule, TAKE 1 CAPSULE BY MOUTH EVERY 7 DAYS, Disp: 24 capsule, Rfl: 0   No Known Allergies   Review of Systems  Constitutional: Negative.   HENT: Negative.   Eyes: Negative.   Respiratory: Negative.   Cardiovascular: Negative.   Gastrointestinal: Negative.    Endocrine: Negative.   Genitourinary: Negative.   Musculoskeletal: Negative.   Skin: Negative.   Allergic/Immunologic: Negative.   Neurological: Negative.  Negative for dizziness and headaches.  Hematological: Negative.   Psychiatric/Behavioral: Negative.      Today's Vitals   11/24/19 1046  BP: 112/64  Pulse: 72  Temp: 98.3 F (36.8 C)  TempSrc: Oral  Weight: 185 lb 12.8 oz (84.3 kg)  Height: 5' 8.8" (1.748 m)  PainSc: 2   PainLoc: Back   Body mass index is 27.6 kg/m.   Objective:  Physical Exam Vitals reviewed.  Constitutional:      Appearance: Normal appearance. He is obese.  HENT:     Head: Normocephalic and atraumatic.     Right Ear: Tympanic membrane, ear canal and external ear normal.     Left Ear: Tympanic membrane, ear canal and external ear normal.  Cardiovascular:     Rate and Rhythm: Normal rate and regular rhythm.     Pulses: Normal pulses.     Heart sounds: Normal heart sounds. No murmur.  Pulmonary:     Effort: Pulmonary effort is normal. No respiratory distress.     Breath sounds: Normal breath sounds.  Abdominal:     General: Abdomen is flat. Bowel sounds are normal. There is no distension.     Palpations: Abdomen is soft.  Genitourinary:    Prostate:  Normal.     Rectum: Guaiac result negative.  Musculoskeletal:        General: Normal range of motion.     Cervical back: Normal range of motion and neck supple.  Skin:    General: Skin is warm.     Capillary Refill: Capillary refill takes less than 2 seconds.  Neurological:     General: No focal deficit present.     Mental Status: He is alert and oriented to person, place, and time.  Psychiatric:        Mood and Affect: Mood normal.        Behavior: Behavior normal.        Thought Content: Thought content normal.        Judgment: Judgment normal.         Assessment And Plan:     1. Encounter for general adult medical examination w/o abnormal findings . Behavior modifications  discussed and diet history reviewed.   . Pt will continue to exercise regularly and modify diet with low GI, plant based foods and decrease intake of processed foods.  . Recommend intake of daily multivitamin, Vitamin D, and calcium.  . Recommend for preventive screenings, as well as recommend immunizations that include influenza, TDAP - POCT Urinalysis Dipstick (81002)  2. Encounter for immunization  - pneumococcal 13-valent conjugate vaccine (PREVNAR 13) SUSP injection; Inject 0.5 mLs into the muscle tomorrow at 10 am for 1 dose.  Dispense: 0.5 mL; Refill: 0  3. Constipation, unspecified constipation type  He is taking Linzess every other day due to having loose stools  4. Vitamin D deficiency  Will check vitamin D level and supplement as needed.     Also encouraged to spend 15 minutes in the sun daily.   5. History of prostate cancer  He is being followed by his oncologist he will call back with the name.    6. Stage 3a chronic kidney disease  I have advised him to drink adequate amounts of water    [name], FNP    THE PATIENT IS ENCOURAGED TO PRACTICE SOCIAL DISTANCING DUE TO THE COVID-19 PANDEMIC.

## 2019-11-24 NOTE — Patient Instructions (Signed)
Health Maintenance  Topic Date Due  . PNA vac Low Risk Adult (1 of 2 - PCV13) 05/19/2001  . TETANUS/TDAP  05/21/2022  . INFLUENZA VACCINE  Completed   Health Maintenance After Age 83 After age 66, you are at a higher risk for certain long-term diseases and infections as well as injuries from falls. Falls are a major cause of broken bones and head injuries in people who are older than age 98. Getting regular preventive care can help to keep you healthy and well. Preventive care includes getting regular testing and making lifestyle changes as recommended by your health care provider. Talk with your health care provider about:  Which screenings and tests you should have. A screening is a test that checks for a disease when you have no symptoms.  A diet and exercise plan that is right for you. What should I know about screenings and tests to prevent falls? Screening and testing are the best ways to find a health problem early. Early diagnosis and treatment give you the best chance of managing medical conditions that are common after age 30. Certain conditions and lifestyle choices may make you more likely to have a fall. Your health care provider may recommend:  Regular vision checks. Poor vision and conditions such as cataracts can make you more likely to have a fall. If you wear glasses, make sure to get your prescription updated if your vision changes.  Medicine review. Work with your health care provider to regularly review all of the medicines you are taking, including over-the-counter medicines. Ask your health care provider about any side effects that may make you more likely to have a fall. Tell your health care provider if any medicines that you take make you feel dizzy or sleepy.  Osteoporosis screening. Osteoporosis is a condition that causes the bones to get weaker. This can make the bones weak and cause them to break more easily.  Blood pressure screening. Blood pressure changes and  medicines to control blood pressure can make you feel dizzy.  Strength and balance checks. Your health care provider may recommend certain tests to check your strength and balance while standing, walking, or changing positions.  Foot health exam. Foot pain and numbness, as well as not wearing proper footwear, can make you more likely to have a fall.  Depression screening. You may be more likely to have a fall if you have a fear of falling, feel emotionally low, or feel unable to do activities that you used to do.  Alcohol use screening. Using too much alcohol can affect your balance and may make you more likely to have a fall. What actions can I take to lower my risk of falls? General instructions  Talk with your health care provider about your risks for falling. Tell your health care provider if: ? You fall. Be sure to tell your health care provider about all falls, even ones that seem minor. ? You feel dizzy, sleepy, or off-balance.  Take over-the-counter and prescription medicines only as told by your health care provider. These include any supplements.  Eat a healthy diet and maintain a healthy weight. A healthy diet includes low-fat dairy products, low-fat (lean) meats, and fiber from whole grains, beans, and lots of fruits and vegetables. Home safety  Remove any tripping hazards, such as rugs, cords, and clutter.  Install safety equipment such as grab bars in bathrooms and safety rails on stairs.  Keep rooms and walkways well-lit. Activity   Follow a regular  exercise program to stay fit. This will help you maintain your balance. Ask your health care provider what types of exercise are appropriate for you.  If you need a cane or walker, use it as recommended by your health care provider.  Wear supportive shoes that have nonskid soles. Lifestyle  Do not drink alcohol if your health care provider tells you not to drink.  If you drink alcohol, limit how much you have: ? 0-1  drink a day for women. ? 0-2 drinks a day for men.  Be aware of how much alcohol is in your drink. In the [place] one drink equals one typical bottle of beer (12 oz), one-half glass of wine (5 oz), or one shot of hard liquor (1 oz).  Do not use any products that contain nicotine or tobacco, such as cigarettes and e-cigarettes. If you need help quitting, ask your health care provider. Summary  Having a healthy lifestyle and getting preventive care can help to protect your health and wellness after age 79.  Screening and testing are the best way to find a health problem early and help you avoid having a fall. Early diagnosis and treatment give you the best chance for managing medical conditions that are more common for people who are older than age 49.  Falls are a major cause of broken bones and head injuries in people who are older than age 75. Take precautions to prevent a fall at home.  Work with your health care provider to learn what changes you can make to improve your health and wellness and to prevent falls. This information is not intended to replace advice given to you by your health care provider. Make sure you discuss any questions you have with your health care provider. Document Released: 10/14/2017 Document Revised: 03/24/2019 Document Reviewed: 10/14/2017 Elsevier Patient Education  2020 [employer].

## 2019-11-25 LAB — VITAMIN D 25 HYDROXY (VIT D DEFICIENCY, FRACTURES): Vit D, 25-Hydroxy: 73 ng/mL (ref 30.0–100.0)

## 2019-11-28 ENCOUNTER — Other Ambulatory Visit: Payer: Self-pay | Admitting: Nurse Practitioner

## 2019-11-28 DIAGNOSIS — E559 Vitamin D deficiency, unspecified: Secondary | ICD-10-CM

## 2019-11-28 MED ORDER — VITAMIN D (ERGOCALCIFEROL) 1.25 MG (50000 UNIT) PO CAPS: 50000.0000 [IU] | ORAL_CAPSULE | ORAL | 0 refills | Status: DC

## 2020-01-04 ENCOUNTER — Ambulatory Visit: Payer: Medicare Other | Admitting: Podiatry

## 2020-01-04 ENCOUNTER — Other Ambulatory Visit: Payer: Self-pay

## 2020-01-04 ENCOUNTER — Encounter: Payer: Self-pay | Admitting: Podiatry

## 2020-01-04 DIAGNOSIS — M79675 Pain in left toe(s): Secondary | ICD-10-CM | POA: Diagnosis not present

## 2020-01-04 DIAGNOSIS — L84 Corns and callosities: Secondary | ICD-10-CM | POA: Diagnosis not present

## 2020-01-04 DIAGNOSIS — B351 Tinea unguium: Secondary | ICD-10-CM

## 2020-01-04 DIAGNOSIS — M79674 Pain in right toe(s): Secondary | ICD-10-CM

## 2020-01-04 DIAGNOSIS — N1831 Chronic kidney disease, stage 3a: Secondary | ICD-10-CM

## 2020-01-04 NOTE — Patient Instructions (Signed)

## 2020-01-08 NOTE — Progress Notes (Signed)
Subjective: Nathan Morse presents today for follow up of painful corns b/l 5th digits and painful mycotic nails b/l that are difficult to trim. Pain interferes with ambulation. Aggravating factors include wearing enclosed shoe gear. Pain is relieved with periodic professional debridement.   No Known Allergies   PCP: [name], FNP; last visit 11/24/2019.  Objective: There were no vitals filed for this visit.  Vascular Examination:  capillary refill time to digits immediate b/l, palpable DP pulses b/l, palpable PT pulses b/l, pedal hair absent b/l and skin temperature gradient within normal limits b/l  Dermatological Examination: Pedal skin with normal turgor, texture and tone bilaterally, toenails 1-5 b/l elongated, dystrophic, thickened, crumbly with subungual debris and hyperkeratotic lesion(s) b/l 5th digit PIPJs and Lister's corns b/l 5th digits.  No erythema, no edema, no drainage, no flocculence  Musculoskeletal: normal muscle strength 5/5 to all lower extremity muscle groups bilaterally, no gross bony deformities bilaterally, no pain crepitus or joint limitation noted with ROM b/l and hammertoes noted to the left, right, 5th toe  Neurological: sensation intact 5/5 intact bilaterally with 10g monofilament  Assessment: 1. Pain due to onychomycosis of toenails of both feet   2. Corns   3. Stage 3a chronic kidney disease      Plan: -Toenails 1-5 b/l were debrided in length and girth without iatrogenic bleeding. -The above corns and calluses were debrided without complication or incident. Total number debrided =4, b/l 5th digit PIPJ and lateral aspect of b/l 5th digit nailplates -Patient to continue soft, supportive shoe gear daily. -Patient to report any pedal injuries to medical professional immediately. -Patient/POA to call should there be question/concern in the interim.  Return in about 3 months (around 04/03/2020) for nail trim.

## 2020-02-27 ENCOUNTER — Encounter: Payer: Self-pay | Admitting: Nurse Practitioner

## 2020-03-02 ENCOUNTER — Other Ambulatory Visit: Payer: Self-pay | Admitting: Nurse Practitioner

## 2020-03-02 DIAGNOSIS — K5900 Constipation, unspecified: Secondary | ICD-10-CM

## 2020-03-02 DIAGNOSIS — R143 Flatulence: Secondary | ICD-10-CM

## 2020-04-04 ENCOUNTER — Ambulatory Visit: Payer: Medicare Other | Admitting: Podiatry

## 2020-05-03 DIAGNOSIS — K4090 Unilateral inguinal hernia, without obstruction or gangrene, not specified as recurrent: Secondary | ICD-10-CM | POA: Diagnosis not present

## 2020-05-24 ENCOUNTER — Telehealth: Payer: Self-pay

## 2020-05-24 ENCOUNTER — Ambulatory Visit: Payer: Medicare Other

## 2020-05-24 ENCOUNTER — Ambulatory Visit: Payer: Medicare Other | Admitting: Nurse Practitioner

## 2020-05-24 NOTE — Telephone Encounter (Signed)
This nurse attempted to contact patient in regards to missing appointments today. Voicemail was left for patient to call back to reschedule.

## 2020-06-06 ENCOUNTER — Ambulatory Visit: Payer: Self-pay | Admitting: General Surgery

## 2020-06-06 DIAGNOSIS — K4090 Unilateral inguinal hernia, without obstruction or gangrene, not specified as recurrent: Secondary | ICD-10-CM | POA: Diagnosis not present

## 2020-06-13 ENCOUNTER — Ambulatory Visit: Payer: Medicare HMO | Admitting: Podiatry

## 2020-06-13 ENCOUNTER — Encounter: Payer: Self-pay | Admitting: Podiatry

## 2020-06-13 ENCOUNTER — Other Ambulatory Visit: Payer: Self-pay

## 2020-06-13 DIAGNOSIS — N1831 Chronic kidney disease, stage 3a: Secondary | ICD-10-CM | POA: Diagnosis not present

## 2020-06-13 DIAGNOSIS — M79675 Pain in left toe(s): Secondary | ICD-10-CM | POA: Diagnosis not present

## 2020-06-13 DIAGNOSIS — B351 Tinea unguium: Secondary | ICD-10-CM

## 2020-06-13 DIAGNOSIS — L84 Corns and callosities: Secondary | ICD-10-CM

## 2020-06-13 DIAGNOSIS — M79674 Pain in right toe(s): Secondary | ICD-10-CM

## 2020-06-17 NOTE — Progress Notes (Signed)
Subjective:  Patient ID: Nathan Morse, male    DOB: March 01, 1934,  MRN: [id number]  86 y.o. male presents with painful corn(s) b/l feet and painful thick toenails that are difficult to trim. Pain interferes with ambulation. Aggravating factors include wearing enclosed shoe gear. Pain is relieved with periodic professional debridement.   He voices no new pedal problems on today's visit.  Review of Systems: Negative except as noted in the HPI. Denies N/V/F/Ch. Past Medical History:  Diagnosis Date  . Cancer [facility])    Past Surgical History:  Procedure Laterality Date  . APPENDECTOMY    . TONSILLECTOMY AND ADENOIDECTOMY     Patient Active Problem List   Diagnosis Date Noted  . Muscle spasm 05/19/2019  . Corns and callosities 05/19/2019  . Pain of toe of left foot 05/19/2019  . Urinary tract infection with hematuria 03/22/2019  . Vitamin D deficiency 03/22/2019    Current Outpatient Medications:  .  LINZESS 72 MCG capsule, TAKE 1 CAPSULE(72 MCG) BY MOUTH DAILY BEFORE BREAKFAST, Disp: 90 capsule, Rfl: 1 .  mirabegron ER (MYRBETRIQ) 50 MG TB24 tablet, Take 50 mg by mouth daily., Disp: , Rfl:  .  sildenafil (REVATIO) 20 MG tablet, SMARTSIG:5 Tablet(s) By Mouth PRN, Disp: , Rfl:  .  vardenafil (LEVITRA) 20 MG tablet, Take 20 mg by mouth as needed., Disp: , Rfl:  .  Vitamin D, Ergocalciferol, (DRISDOL) 1.25 MG (50000 UT) CAPS capsule, Take 1 capsule (50,000 Units total) by mouth every 7 (seven) days., Disp: 12 capsule, Rfl: 0 No Known Allergies Social History   Tobacco Use  Smoking Status Never Smoker  Smokeless Tobacco Current User  . Types: Chew  Tobacco Comment   wants to quit, but hard    Objective:   Constitutional Pt is a pleasant 86 y.o. African American male, WD, WN in NAD.Marland Kitchen  Vascular Neurovascular status unchanged b/l lower extremities. Capillary refill time to digits immediate b/l. Palpable pedal pulses b/l LE. Pedal hair absent. Lower extremity skin temperature gradient  within normal limits. No pain with calf compression b/l. No cyanosis or clubbing noted.  Neurologic Normal speech. Oriented to person, place, and time. Protective sensation intact 5/5 intact bilaterally with 10g monofilament b/l. Vibratory sensation intact b/l.  Dermatologic Pedal skin with normal turgor, texture and tone bilaterally. No open wounds bilaterally. No interdigital macerations bilaterally. Toenails 1-5 b/l elongated, discolored, dystrophic, thickened, crumbly with subungual debris and tenderness to dorsal palpation. Hyperkeratotic lesion(s) L 5th toe and R 5th toe PIPJ and Lister's corns b/l 5th digits.  No erythema, no edema, no drainage, no flocculence.  Orthopedic: Normal muscle strength 5/5 to all lower extremity muscle groups bilaterally. No pain crepitus or joint limitation noted with ROM b/l. Hammertoes noted to the L 5th toe and R 5th toe.   Radiographs: None Assessment:  No diagnosis found. Plan:  Patient was evaluated and treated and all questions answered.  Onychomycosis with pain -Nails palliatively debridement as below. -Educated on self-care  Procedure: Nail Debridement Rationale: Pain Type of Debridement: manual, sharp debridement. Instrumentation: Nail nipper, rotary burr. Number of Nails: 10  -Examined patient. -Toenails 1-5 b/l were debrided in length and girth with sterile nail nippers and dremel without iatrogenic bleeding.  -Toenails 1-5 bilaterally debrided in length and girth without iatrogenic bleeding with sterile nail nipper and dremel.  -Corn(s) L 5th toe and R 5th toe PIPJ and Lister's corns b/l 5th digits pared utilizing sterile scalpel blade without complication or incident. Total number debrided=4. -Patient to report any  pedal injuries to medical professional immediately. -Patient to continue soft, supportive shoe gear daily. -Patient/POA to call should there be question/concern in the interim.  Return in about 3 months (around 09/13/2020)  for nail and callus trim.  Marzetta Board, DPM

## 2020-06-20 NOTE — Patient Instructions (Addendum)
DUE TO COVID-19 ONLY ONE VISITOR IS ALLOWED TO COME WITH YOU AND STAY IN THE WAITING ROOM ONLY DURING PRE OP AND PROCEDURE DAY OF SURGERY. THE 1 VISITOR MAY VISIT WITH YOU AFTER SURGERY IN YOUR PRIVATE ROOM DURING VISITING HOURS ONLY!  YOU NEED TO HAVE A COVID 19 TEST ON 06-22-2020 @ 11:20 AM, THIS TEST MUST BE DONE BEFORE SURGERY, COME  [place], [place] , [id number].  ([place]) ONCE YOUR COVID TEST IS COMPLETED, PLEASE BEGIN THE QUARANTINE INSTRUCTIONS AS OUTLINED IN YOUR HANDOUT.                Nathan Morse  06/20/2020   Your procedure is scheduled on: 06-26-20   Report to [facility] Main  Entrance    Report to Short Stay at 5:30 AM     Call this number if you have problems the morning of surgery [phone]    Remember: Do not eat food or drink liquids after Midnight.   Take these medicines the morning of surgery with A SIP OF WATER:  None  BRUSH YOUR TEETH MORNING OF SURGERY AND RINSE YOUR MOUTH OUT, NO CHEWING GUM CANDY OR MINTS.                               You may not have any metal on your body including hair pins and              Piercings.     Do not wear jewelry, cologne, lotions, powders or deodorant               Men may shave face and neck.   Do not bring valuables to the hospital. [place].  Contacts, dentures or bridgework may not be worn into surgery.     Patients discharged the day of surgery will not be allowed to drive home. IF YOU ARE HAVING SURGERY AND GOING HOME THE SAME DAY, YOU MUST HAVE AN ADULT TO DRIVE YOU HOME AND BE WITH YOU FOR 24 HOURS. YOU MAY GO HOME BY TAXI OR UBER OR ORTHERWISE, BUT AN ADULT MUST ACCOMPANY YOU HOME AND STAY WITH YOU FOR 24 HOURS.  Name and phone number of your driver: Lessie Dings [phone]  Special Instructions: N/A              Please read over the following fact sheets you were  given: _____________________________________________________________________         [facility] - Preparing for Surgery Before surgery, you can play an important role.  Because skin is not sterile, your skin needs to be as free of germs as possible.  You can reduce the number of germs on your skin by washing with CHG (chlorahexidine gluconate) soap before surgery.  CHG is an antiseptic cleaner which kills germs and bonds with the skin to continue killing germs even after washing. Please DO NOT use if you have an allergy to CHG or antibacterial soaps.  If your skin becomes reddened/irritated stop using the CHG and inform your nurse when you arrive at Short Stay. Do not shave (including legs and underarms) for at least 48 hours prior to the first CHG shower.  You may shave your face/neck.  Please follow these instructions carefully:  1.  Shower with CHG Soap the night before surgery and  the  morning of surgery.  2.  If you choose to wash your hair, wash your hair first as usual with your normal  shampoo.  3.  After you shampoo, rinse your hair and body thoroughly to remove the shampoo.                             4.  Use CHG as you would any other liquid soap.  You can apply chg directly to the skin and wash.  Gently with a scrungie or clean washcloth.  5.  Apply the CHG Soap to your body ONLY FROM THE NECK DOWN.   Do   not use on face/ open                           Wound or open sores. Avoid contact with eyes, ears mouth and   genitals (private parts).                       Wash face,  Genitals (private parts) with your normal soap.             6.  Wash thoroughly, paying special attention to the area where your    surgery  will be performed.  7.  Thoroughly rinse your body with warm water from the neck down.  8.  DO NOT shower/wash with your normal soap after using and rinsing off the CHG Soap.                9.  Pat yourself dry with a clean towel.            10.  Wear clean pajamas.             11.  Place clean sheets on your bed the night of your first shower and do not  sleep with pets. Day of Surgery : Do not apply any lotions/deodorants the morning of surgery.  Please wear clean clothes to the hospital/surgery center.  FAILURE TO FOLLOW THESE INSTRUCTIONS MAY RESULT IN THE CANCELLATION OF YOUR SURGERY  PATIENT SIGNATURE_________________________________  NURSE SIGNATURE__________________________________  ________________________________________________________________________

## 2020-06-20 NOTE — Progress Notes (Signed)
COVID Vaccine Completed: Date COVID Vaccine completed: COVID vaccine manufacturer: [employer]   PCP - [name], FNP Cardiologist -   Chest x-ray -  EKG -  Stress Test -  ECHO -  Cardiac Cath -   Sleep Study -  CPAP -   Fasting Blood Sugar -  Checks Blood Sugar _____ times a day  Blood Thinner Instructions: Aspirin Instructions: Last Dose:  Anesthesia review:   Patient denies shortness of breath, fever, cough and chest pain at PAT appointment   Patient verbalized understanding of instructions that were given to them at the PAT appointment. Patient was also instructed that they will need to review over the PAT instructions again at home before surgery.

## 2020-06-21 ENCOUNTER — Encounter (HOSPITAL_COMMUNITY): Payer: Self-pay

## 2020-06-21 ENCOUNTER — Encounter (HOSPITAL_COMMUNITY)
Admission: RE | Admit: 2020-06-21 | Discharge: 2020-06-21 | Disposition: A | Discharge status: Home / Self Care | Payer: Medicare Other | Source: Ambulatory Visit | Attending: General Surgery | Admitting: General Surgery

## 2020-06-21 ENCOUNTER — Other Ambulatory Visit: Payer: Self-pay

## 2020-06-21 DIAGNOSIS — Z01812 Encounter for preprocedural laboratory examination: Secondary | ICD-10-CM | POA: Insufficient documentation

## 2020-06-21 LAB — CBC
HCT: 45.1 % (ref 39.0–52.0)
Hemoglobin: 13.8 g/dL (ref 13.0–17.0)
MCH: 28.7 pg (ref 26.0–34.0)
MCHC: 30.6 g/dL (ref 30.0–36.0)
MCV: 93.8 fL (ref 80.0–100.0)
Platelets: 164 10*3/uL (ref 150–400)
RBC: 4.81 MIL/uL (ref 4.22–5.81)
RDW: 12.8 % (ref 11.5–15.5)
WBC: 4.9 10*3/uL (ref 4.0–10.5)
nRBC: 0 % (ref 0.0–0.2)

## 2020-06-21 NOTE — Progress Notes (Signed)
COVID Vaccine Completed: x2  Date COVID Vaccine completed: COVID vaccine manufacturer: [place]  (Pt not sure of the manufacturer)  PCP - [name], FNP Cardiologist - N/A   Chest x-ray -  EKG -  Stress Test -  ECHO -  Cardiac Cath -   Sleep Study -  CPAP -   Fasting Blood Sugar -  Checks Blood Sugar _____ times a day  Blood Thinner Instructions: Aspirin Instructions: Last Dose:  Anesthesia review:   No SOB w/ADL's   Patient denies shortness of breath, fever, cough and chest pain at PAT appointment   Patient verbalized understanding of instructions that were given to them at the PAT appointment. Patient was also instructed that they will need to review over the PAT instructions again at home before surgery.

## 2020-06-22 ENCOUNTER — Other Ambulatory Visit (HOSPITAL_COMMUNITY)
Admission: RE | Admit: 2020-06-22 | Discharge: 2020-06-22 | Disposition: A | Discharge status: Home / Self Care | Payer: Medicare Other | Source: Ambulatory Visit | Attending: General Surgery | Admitting: General Surgery

## 2020-06-22 DIAGNOSIS — Z01812 Encounter for preprocedural laboratory examination: Secondary | ICD-10-CM | POA: Insufficient documentation

## 2020-06-22 DIAGNOSIS — Z20822 Contact with and (suspected) exposure to covid-19: Secondary | ICD-10-CM | POA: Insufficient documentation

## 2020-06-22 LAB — SARS CORONAVIRUS 2 (TAT 6-24 HRS): SARS Coronavirus 2: NEGATIVE

## 2020-06-25 NOTE — Anesthesia Preprocedure Evaluation (Addendum)
Anesthesia Evaluation  Patient identified by MRN, date of birth, ID band Patient awake    Reviewed: Allergy & Precautions, NPO status , Patient's Chart, lab work & pertinent test results  Airway Mallampati: III  TM Distance: >3 FB Neck ROM: Full    Dental  (+) Upper Dentures, Lower Dentures   Pulmonary neg pulmonary ROS,    Pulmonary exam normal breath sounds clear to auscultation       Cardiovascular negative cardio ROS Normal cardiovascular exam Rhythm:Regular Rate:Normal     Neuro/Psych negative neurological ROS  negative psych ROS   GI/Hepatic negative GI ROS, Neg liver ROS,   Endo/Other  negative endocrine ROS  Renal/GU negative Renal ROS     Musculoskeletal negative musculoskeletal ROS (+) Ambulates with a cane   Abdominal   Peds  Hematology negative hematology ROS (+)   Anesthesia Other Findings RIGHT INGUINAL HERNIA  Reproductive/Obstetrics                            Anesthesia Physical Anesthesia Plan  ASA: II  Anesthesia Plan: General and Regional   Post-op Pain Management: GA combined w/ Regional for post-op pain   Induction: Intravenous  PONV Risk Score and Plan: 3 and Ondansetron, Dexamethasone and Treatment may vary due to age or medical condition  Airway Management Planned: Oral ETT  Additional Equipment:   Intra-op Plan:   Post-operative Plan: Extubation in OR  Informed Consent: I have reviewed the patients History and Physical, chart, labs and discussed the procedure including the risks, benefits and alternatives for the proposed anesthesia with the patient or authorized representative who has indicated his/her understanding and acceptance.     Dental advisory given  Plan Discussed with: CRNA  Anesthesia Plan Comments:        Anesthesia Quick Evaluation

## 2020-06-26 ENCOUNTER — Encounter (HOSPITAL_COMMUNITY)
Admission: RE | Disposition: A | Discharge status: Home / Self Care | Payer: Self-pay | Source: Home / Self Care | Attending: General Surgery

## 2020-06-26 ENCOUNTER — Ambulatory Visit (HOSPITAL_COMMUNITY)
Admission: RE | Admit: 2020-06-26 | Discharge: 2020-06-26 | Disposition: A | Discharge status: Home / Self Care | Payer: Medicare Other | Attending: General Surgery | Admitting: General Surgery

## 2020-06-26 ENCOUNTER — Ambulatory Visit (HOSPITAL_COMMUNITY): Payer: Medicare Other | Admitting: Anesthesiology

## 2020-06-26 ENCOUNTER — Encounter (HOSPITAL_COMMUNITY): Payer: Self-pay | Admitting: General Surgery

## 2020-06-26 DIAGNOSIS — K4090 Unilateral inguinal hernia, without obstruction or gangrene, not specified as recurrent: Secondary | ICD-10-CM | POA: Insufficient documentation

## 2020-06-26 DIAGNOSIS — M62838 Other muscle spasm: Secondary | ICD-10-CM | POA: Diagnosis not present

## 2020-06-26 DIAGNOSIS — Z79899 Other long term (current) drug therapy: Secondary | ICD-10-CM | POA: Insufficient documentation

## 2020-06-26 DIAGNOSIS — G8918 Other acute postprocedural pain: Secondary | ICD-10-CM | POA: Diagnosis not present

## 2020-06-26 DIAGNOSIS — E559 Vitamin D deficiency, unspecified: Secondary | ICD-10-CM | POA: Diagnosis not present

## 2020-06-26 HISTORY — PX: INGUINAL HERNIA REPAIR: SHX194

## 2020-06-26 SURGERY — REPAIR, HERNIA, INGUINAL, ADULT
Anesthesia: Regional | Site: Inguinal | Laterality: Right

## 2020-06-26 MED ORDER — DEXAMETHASONE SODIUM PHOSPHATE 10 MG/ML IJ SOLN
INTRAMUSCULAR | Status: AC
  Filled 2020-06-26: qty 1

## 2020-06-26 MED ORDER — CEFAZOLIN SODIUM-DEXTROSE 2-4 GM/100ML-% IV SOLN
2.0000 g | INTRAVENOUS | Status: AC
  Administered 2020-06-26: 2 g via INTRAVENOUS
  Filled 2020-06-26: qty 100

## 2020-06-26 MED ORDER — CHLORHEXIDINE GLUCONATE CLOTH 2 % EX PADS: 6.0000 | MEDICATED_PAD | Freq: Once | CUTANEOUS | Status: DC

## 2020-06-26 MED ORDER — SUGAMMADEX SODIUM 200 MG/2ML IV SOLN
INTRAVENOUS | Status: DC | PRN
  Administered 2020-06-26: 200 mg via INTRAVENOUS

## 2020-06-26 MED ORDER — CHLORHEXIDINE GLUCONATE 0.12 % MT SOLN
15.0000 mL | Freq: Once | OROMUCOSAL | Status: AC
  Administered 2020-06-26: 15 mL via OROMUCOSAL

## 2020-06-26 MED ORDER — LIDOCAINE 2% (20 MG/ML) 5 ML SYRINGE
INTRAMUSCULAR | Status: DC | PRN
  Administered 2020-06-26: 50 mg via INTRAVENOUS

## 2020-06-26 MED ORDER — EPHEDRINE 5 MG/ML INJ
INTRAVENOUS | Status: AC
  Filled 2020-06-26: qty 10

## 2020-06-26 MED ORDER — PROPOFOL 10 MG/ML IV BOLUS
INTRAVENOUS | Status: AC
  Filled 2020-06-26: qty 20

## 2020-06-26 MED ORDER — CLONIDINE HCL (ANALGESIA) 100 MCG/ML EP SOLN
EPIDURAL | Status: DC | PRN
  Administered 2020-06-26: 100 ug

## 2020-06-26 MED ORDER — IBUPROFEN 800 MG PO TABS: 800.0000 mg | ORAL_TABLET | Freq: Three times a day (TID) | ORAL | 0 refills | Status: DC | PRN

## 2020-06-26 MED ORDER — ORAL CARE MOUTH RINSE: 15.0000 mL | Freq: Once | OROMUCOSAL | Status: AC

## 2020-06-26 MED ORDER — PROPOFOL 10 MG/ML IV BOLUS
INTRAVENOUS | Status: DC | PRN
  Administered 2020-06-26: 20 mg via INTRAVENOUS
  Administered 2020-06-26: 100 mg via INTRAVENOUS

## 2020-06-26 MED ORDER — FENTANYL CITRATE (PF) 100 MCG/2ML IJ SOLN
INTRAMUSCULAR | Status: AC
  Filled 2020-06-26: qty 2

## 2020-06-26 MED ORDER — FENTANYL CITRATE (PF) 100 MCG/2ML IJ SOLN: 25.0000 ug | INTRAMUSCULAR | Status: DC | PRN

## 2020-06-26 MED ORDER — ACETAMINOPHEN 500 MG PO TABS
1000.0000 mg | ORAL_TABLET | ORAL | Status: AC
  Administered 2020-06-26: 1000 mg via ORAL
  Filled 2020-06-26: qty 2

## 2020-06-26 MED ORDER — BUPIVACAINE LIPOSOME 1.3 % IJ SUSP
20.0000 mL | Freq: Once | INTRAMUSCULAR | Status: DC
  Filled 2020-06-26: qty 20

## 2020-06-26 MED ORDER — ONDANSETRON HCL 4 MG/2ML IJ SOLN
INTRAMUSCULAR | Status: AC
  Filled 2020-06-26: qty 2

## 2020-06-26 MED ORDER — ROPIVACAINE HCL 5 MG/ML IJ SOLN
INTRAMUSCULAR | Status: DC | PRN
  Administered 2020-06-26: 30 mL via PERINEURAL

## 2020-06-26 MED ORDER — ROCURONIUM BROMIDE 10 MG/ML (PF) SYRINGE
PREFILLED_SYRINGE | INTRAVENOUS | Status: AC
  Filled 2020-06-26: qty 10

## 2020-06-26 MED ORDER — MIDAZOLAM HCL 2 MG/2ML IJ SOLN
INTRAMUSCULAR | Status: AC
  Filled 2020-06-26: qty 2

## 2020-06-26 MED ORDER — ONDANSETRON HCL 4 MG/2ML IJ SOLN: 4.0000 mg | Freq: Once | INTRAMUSCULAR | Status: DC | PRN

## 2020-06-26 MED ORDER — ROCURONIUM BROMIDE 10 MG/ML (PF) SYRINGE
PREFILLED_SYRINGE | INTRAVENOUS | Status: DC | PRN
  Administered 2020-06-26: 10 mg via INTRAVENOUS
  Administered 2020-06-26: 50 mg via INTRAVENOUS
  Administered 2020-06-26: 10 mg via INTRAVENOUS

## 2020-06-26 MED ORDER — BUPIVACAINE HCL 0.25 % IJ SOLN
INTRAMUSCULAR | Status: AC
  Filled 2020-06-26: qty 1

## 2020-06-26 MED ORDER — BUPIVACAINE HCL 0.25 % IJ SOLN
INTRAMUSCULAR | Status: DC | PRN
  Administered 2020-06-26: 20 mL

## 2020-06-26 MED ORDER — 0.9 % SODIUM CHLORIDE (POUR BTL) OPTIME
TOPICAL | Status: DC | PRN
  Administered 2020-06-26: 1000 mL

## 2020-06-26 MED ORDER — DEXAMETHASONE SODIUM PHOSPHATE 10 MG/ML IJ SOLN
INTRAMUSCULAR | Status: DC | PRN
  Administered 2020-06-26: 8 mg via INTRAVENOUS

## 2020-06-26 MED ORDER — EPHEDRINE SULFATE-NACL 50-0.9 MG/10ML-% IV SOSY
PREFILLED_SYRINGE | INTRAVENOUS | Status: DC | PRN
  Administered 2020-06-26: 10 mg via INTRAVENOUS

## 2020-06-26 MED ORDER — LACTATED RINGERS IV SOLN: INTRAVENOUS | Status: DC

## 2020-06-26 MED ORDER — ONDANSETRON HCL 4 MG/2ML IJ SOLN
INTRAMUSCULAR | Status: DC | PRN
  Administered 2020-06-26: 4 mg via INTRAVENOUS

## 2020-06-26 MED ORDER — FENTANYL CITRATE (PF) 100 MCG/2ML IJ SOLN
INTRAMUSCULAR | Status: DC | PRN
  Administered 2020-06-26: 25 ug via INTRAVENOUS
  Administered 2020-06-26: 50 ug via INTRAVENOUS
  Administered 2020-06-26: 25 ug via INTRAVENOUS

## 2020-06-26 MED ORDER — LIDOCAINE 2% (20 MG/ML) 5 ML SYRINGE
INTRAMUSCULAR | Status: AC
  Filled 2020-06-26: qty 5

## 2020-06-26 MED ORDER — OXYCODONE HCL 5 MG PO TABS: 5.0000 mg | ORAL_TABLET | Freq: Four times a day (QID) | ORAL | 0 refills | Status: DC | PRN

## 2020-06-26 SURGICAL SUPPLY — 45 items
BENZOIN TINCTURE PRP APPL 2/3 (GAUZE/BANDAGES/DRESSINGS) ×2 IMPLANT
BLADE SURG 15 STRL LF DISP TIS (BLADE) ×1 IMPLANT
BLADE SURG 15 STRL SS (BLADE) ×2
CELLS DAT CNTRL 66122 CELL SVR (MISCELLANEOUS) IMPLANT
CHLORAPREP W/TINT 26 (MISCELLANEOUS) ×2 IMPLANT
COVER SURGICAL LIGHT HANDLE (MISCELLANEOUS) ×2 IMPLANT
COVER WAND RF STERILE (DRAPES) ×2 IMPLANT
DECANTER SPIKE VIAL GLASS SM (MISCELLANEOUS) ×2 IMPLANT
DERMABOND ADVANCED (GAUZE/BANDAGES/DRESSINGS) ×1
DERMABOND ADVANCED .7 DNX12 (GAUZE/BANDAGES/DRESSINGS) ×1 IMPLANT
DRAIN PENROSE 0.5X18 (DRAIN) IMPLANT
DRAPE LAPAROTOMY TRNSV 102X78 (DRAPES) ×2 IMPLANT
DRSG TEGADERM 4X4.75 (GAUZE/BANDAGES/DRESSINGS) IMPLANT
DRSG TELFA PLUS 4X6 ADH ISLAND (GAUZE/BANDAGES/DRESSINGS) ×2 IMPLANT
ELECT REM PT RETURN 15FT ADLT (MISCELLANEOUS) ×2 IMPLANT
GAUZE SPONGE 4X4 12PLY STRL (GAUZE/BANDAGES/DRESSINGS) IMPLANT
GLOVE BIOGEL PI IND STRL 7.0 (GLOVE) IMPLANT
GLOVE BIOGEL PI INDICATOR 7.0 (GLOVE)
GLOVE SURG SS PI 7.0 STRL IVOR (GLOVE) ×2 IMPLANT
GOWN STRL REUS W/TWL LRG LVL3 (GOWN DISPOSABLE) ×2 IMPLANT
GOWN STRL REUS W/TWL XL LVL3 (GOWN DISPOSABLE) ×2 IMPLANT
KIT BASIN OR (CUSTOM PROCEDURE TRAY) ×2 IMPLANT
KIT TURNOVER KIT A (KITS) IMPLANT
MESH HERNIA 3X6 (Mesh General) ×2 IMPLANT
NEEDLE HYPO 22GX1.5 SAFETY (NEEDLE) ×2 IMPLANT
PACK BASIC VI WITH GOWN DISP (CUSTOM PROCEDURE TRAY) ×2 IMPLANT
PENCIL SMOKE EVACUATOR (MISCELLANEOUS) IMPLANT
RETRACTOR WND ALEXIS 25 LRG (MISCELLANEOUS) IMPLANT
RTRCTR WOUND ALEXIS 18CM MED (MISCELLANEOUS)
RTRCTR WOUND ALEXIS 25CM LRG (MISCELLANEOUS)
SPONGE LAP 18X18 RF (DISPOSABLE) IMPLANT
SPONGE LAP 4X18 RFD (DISPOSABLE) ×2 IMPLANT
STRIP CLOSURE SKIN 1/2X4 (GAUZE/BANDAGES/DRESSINGS) IMPLANT
SUT MNCRL AB 4-0 PS2 18 (SUTURE) ×2 IMPLANT
SUT PROLENE 2 0 CT2 30 (SUTURE) ×4 IMPLANT
SUT SILK 2 0 SH CR/8 (SUTURE) IMPLANT
SUT VIC AB 2-0 CT1 27 (SUTURE) ×2
SUT VIC AB 2-0 CT1 TAPERPNT 27 (SUTURE) ×1 IMPLANT
SUT VIC AB 3-0 SH 27 (SUTURE) ×4
SUT VIC AB 3-0 SH 27XBRD (SUTURE) ×2 IMPLANT
SYR BULB IRRIG 60ML STRL (SYRINGE) ×2 IMPLANT
SYR CONTROL 10ML LL (SYRINGE) ×2 IMPLANT
TOWEL OR 17X26 10 PK STRL BLUE (TOWEL DISPOSABLE) ×2 IMPLANT
TOWEL OR NON WOVEN STRL DISP B (DISPOSABLE) ×2 IMPLANT
YANKAUER SUCT BULB TIP 10FT TU (MISCELLANEOUS) ×2 IMPLANT

## 2020-06-26 NOTE — Anesthesia Procedure Notes (Addendum)
Anesthesia Regional Block: TAP block   Pre-Anesthetic Checklist: ,, timeout performed, Correct Patient, Correct Site, Correct Laterality, Correct Procedure, Correct Position, site marked, Risks and benefits discussed,  Surgical consent,  Pre-op evaluation,  At surgeon's request and post-op pain management  Laterality: Right  Prep: chloraprep       Needles:  Injection technique: Single-shot  Needle Type: Echogenic Stimulator Needle     Needle Length: 10cm  Needle Gauge: 20     Additional Needles:   Procedures:,,,, ultrasound used (permanent image in chart),,,,  Narrative:  Start time: 06/26/2020 7:05 AM End time: 06/26/2020 7:15 AM Injection made incrementally with aspirations every 5 mL.  Performed by: Personally  Anesthesiologist: Murvin Natal, MD  Additional Notes: Functioning IV was confirmed and monitors were applied.  A timeout was performed. Sterile prep, hand hygiene and sterile gloves were used. A 181mm 20ga BBraun echogenic stimulator needle was used. Negative aspiration and negative test dose prior to incremental administration of local anesthetic. The patient tolerated the procedure well.  Ultrasound guidance: relevent anatomy identified, needle position confirmed, local anesthetic spread visualized around nerve(s), vascular puncture avoided.  Image printed for medical record.

## 2020-06-26 NOTE — Op Note (Signed)
Preop diagnosis: right inguinal hernia  Postop diagnosis: right indirect inguinal hernia  Procedure: open Right inguinal hernia repair with mesh  Surgeon: Gurney Maxin, M.D.  Asst: general  Anesthesia: Gen.   Indications for procedure: Nathan Morse is a 86 y.o. male with symptoms of pain and enlarging Right inguinal hernia(s). After discussing risks, alternatives and benefits he decided on open repair and was brought to day surgery for repair.  Description of procedure: The patient was brought into the operative suite, placed supine. Anesthesia was administered with endotracheal tube. Patient was strapped in place. The patient was prepped and draped in the usual sterile fashion.  The anterior superior iliac spine and pubic tubercle were identified on the Right side. An incision was made 1cm above the connecting line, representative of the location of the inguinal ligament. The subcutaneous tissue was bluntly dissected, scarpa's fascia was dissected away. The external abdominal oblique fascia was identified and sharply opened down to the external inguinal ring. The conjoint tendon and inguinal ligament were identified. The cord structures and sac were dissected free of the surrounding tissue in 360 degrees. A penrose drain was used to encircle the contents. The cremasteric fibers were dissected free of the contents of the cord and hernia sac. The cord structures (vessels and vas deferens) were identified and carefully dissected away from the hernia sac. The sac was moderate sized, reduced and indirect. The sac was divided at the base and 3-0 vicryl used to close it in running fashion. The hernia sac was dissected down to the internal inguinal ring. Preperitoneal fat was identified showing appropriate dissection. The sac was then reduced into the preperitoneal space. A 3x6 Bard mesh was then used to close the defect and reinforce the floor. The mesh was sutured to the lacunar ligament and inguinal  ligament using a 2-0 prolene in running fashion. Next the superior edge of the mesh was sutured to the conjoined tendon using a 2-0 running Prolene. An additional 2-0 Prolene was used to suture the tail ends of the mesh together re-creating the deep ring. Cord structures are running in a neutral position through the mesh. Next the external abdominal oblique fascia was closed with a 2-0 Vicryl in running fashion to re-create the external inguinal ring. Scarpa's fascia was closed with 3-0 Vicryl in running fashion. Skin was closed with a 4-0 Monocryl subcuticular stitch in running fashion. Dermabond place for dressing. Patient woke from anesthesia and brought to PACU in stable condition. All counts are correct.    Findings: right indirect inguinal hernia  Specimen: none  Blood loss: 10 ml  Local anesthesia: 20 ml Marcaine  Complications: none  Implant: 3 x 6 Bard mesh  Gurney Maxin, M.D. General, Bariatric, & Minimally Invasive Surgery [facility] 9:00 AM 06/26/2020

## 2020-06-26 NOTE — Anesthesia Postprocedure Evaluation (Signed)
Anesthesia Post Note  Patient: Nathan Morse  Procedure(s) Performed: right open inguinal hernia repair with mesh (Right Inguinal)     Patient location during evaluation: PACU Anesthesia Type: Regional and General Level of consciousness: awake Pain management: pain level controlled Vital Signs Assessment: post-procedure vital signs reviewed and stable Respiratory status: spontaneous breathing, nonlabored ventilation, respiratory function stable and patient connected to nasal cannula oxygen Cardiovascular status: blood pressure returned to baseline and stable Postop Assessment: no apparent nausea or vomiting Anesthetic complications: no   No complications documented.  Last Vitals:  Vitals:   06/26/20 1030 06/26/20 1132  BP: (!) 149/84 (!) 148/89  Pulse: 69 67  Resp: 17 20  Temp:  36.4 C  SpO2: 93% 93%    Last Pain:  Vitals:   06/26/20 1132  TempSrc: Oral  PainSc: 0-No pain                 Ottilia Pippenger P Kristyne Woodring

## 2020-06-26 NOTE — Transfer of Care (Signed)
Immediate Anesthesia Transfer of Care Note  Patient: Nathan Morse  Procedure(s) Performed: right open inguinal hernia repair with mesh (Right Inguinal)  Patient Location: PACU  Anesthesia Type:GA combined with regional for post-op pain  Level of Consciousness: drowsy and patient cooperative  Airway & Oxygen Therapy: Patient Spontanous Breathing and Patient connected to face mask oxygen  Post-op Assessment: Report given to RN and Post -op Vital signs reviewed and stable  Post vital signs: Reviewed and stable  Last Vitals:  Vitals Value Taken Time  BP 128/73 06/26/20 0915  Temp    Pulse 77 06/26/20 0915  Resp 24 06/26/20 0915  SpO2 95 % 06/26/20 0915  Vitals shown include unvalidated device data.  Last Pain:  Vitals:   06/26/20 0615  TempSrc: Oral         Complications: No complications documented.

## 2020-06-26 NOTE — H&P (Signed)
Nathan Morse is an 86 y.o. male.   Chief Complaint: inguinal hernia HPI: 86 yo male with right inguinal hernia. He has discomfort and pain related to the hernia.  Past Medical History:  Diagnosis Date  . Cancer [facility])     Past Surgical History:  Procedure Laterality Date  . APPENDECTOMY    . PENILE BIOPSY    . SHOULDER ARTHROSCOPY    . TONSILLECTOMY    . TONSILLECTOMY AND ADENOIDECTOMY      Family History  Adopted: Yes  Family history unknown: Yes   Social History:  reports that he has never smoked. His smokeless tobacco use includes chew. He reports that he does not drink alcohol and does not use drugs.  Allergies: No Known Allergies  Medications Prior to Admission  Medication Sig Dispense Refill  . LINZESS 72 MCG capsule TAKE 1 CAPSULE(72 MCG) BY MOUTH DAILY BEFORE BREAKFAST (Patient taking differently: Take 72 mcg by mouth daily before breakfast. ) 90 capsule 1  . mirabegron ER (MYRBETRIQ) 50 MG TB24 tablet Take 50 mg by mouth at bedtime.     . sildenafil (REVATIO) 20 MG tablet Take 100 mg by mouth daily as needed (sexual dysfunstion).     . Vitamin D, Ergocalciferol, (DRISDOL) 1.25 MG (50000 UT) CAPS capsule Take 1 capsule (50,000 Units total) by mouth every 7 (seven) days. (Patient not taking: Reported on 06/21/2020) 12 capsule 0    No results found for this or any previous visit (from the past 48 hour(s)). No results found.  Review of Systems  Constitutional: Negative for chills and fever.  HENT: Negative for hearing loss.   Respiratory: Negative for cough.   Cardiovascular: Negative for chest pain and palpitations.  Gastrointestinal: Negative for abdominal pain, nausea and vomiting.  Genitourinary: Negative for dysuria and urgency.  Musculoskeletal: Negative for myalgias and neck pain.  Skin: Negative for rash.  Neurological: Negative for dizziness and headaches.  Hematological: Does not bruise/bleed easily.  Psychiatric/Behavioral: Negative for suicidal ideas.     Blood pressure (!) 157/80, pulse 67, temperature 98 F (36.7 C), temperature source Oral, resp. rate 17, SpO2 100 %. Physical Exam Vitals reviewed.  Constitutional:      Appearance: He is well-developed.  HENT:     Head: Normocephalic and atraumatic.  Eyes:     Conjunctiva/sclera: Conjunctivae normal.     Pupils: Pupils are equal, round, and reactive to light.  Cardiovascular:     Rate and Rhythm: Normal rate and regular rhythm.  Pulmonary:     Effort: Pulmonary effort is normal.     Breath sounds: Normal breath sounds.  Abdominal:     General: Bowel sounds are normal. There is no distension.     Palpations: Abdomen is soft.     Tenderness: There is no abdominal tenderness.     Comments: Right inguinal hernia  Musculoskeletal:        General: Normal range of motion.     Cervical back: Normal range of motion and neck supple.  Skin:    General: Skin is warm and dry.  Neurological:     Mental Status: He is alert and oriented to person, place, and time.  Psychiatric:        Behavior: Behavior normal.      Assessment/Plan 86 yo male with inguinal hernia -right open inguinal hernia repair with mesh as outpatient  Mickeal Skinner, MD 06/26/2020, 7:23 AM

## 2020-06-26 NOTE — Discharge Instructions (Signed)
[facility] _______[facility] ° °UMBILICAL OR INGUINAL HERNIA REPAIR: POST OP INSTRUCTIONS ° °Always review your discharge instruction sheet given to you by the facility where your surgery was performed. °IF YOU HAVE DISABILITY OR FAMILY LEAVE FORMS, YOU MUST BRING THEM TO THE OFFICE FOR PROCESSING.   °DO NOT GIVE THEM TO YOUR DOCTOR. ° °1. A  prescription for pain medication may be given to you upon discharge.  Take your pain medication as prescribed, if needed.  If narcotic pain medicine is not needed, then you may take acetaminophen (Tylenol) or ibuprofen (Advil) as needed. °2. Take your usually prescribed medications unless otherwise directed. °If you need a refill on your pain medication, please contact your pharmacy.  They will contact our office to request authorization. Prescriptions will not be filled after 5 pm or on week-ends. °3. You should follow a light diet the first 24 hours after arrival home, such as soup and crackers, etc.  Be sure to include lots of fluids daily.  Resume your normal diet the day after surgery. °4.Most patients will experience some swelling and bruising around the umbilicus or in the groin and scrotum.  Ice packs and reclining will help.  Swelling and bruising can take several days to resolve.  °6. It is common to experience some constipation if taking pain medication after surgery.  Increasing fluid intake and taking a stool softener (such as Colace) will usually help or prevent this problem from occurring.  A mild laxative (Milk of Magnesia or Miralax) should be taken according to package directions if there are no bowel movements after 48 hours. °7. Unless discharge instructions indicate otherwise, you may remove your bandages 24-48 hours after surgery, and you may shower at that time.  You may have steri-strips (small skin tapes) in place directly over the incision.  These strips should be left on the skin for 7-10 days.  If your surgeon used skin glue on the  incision, you may shower in 24 hours.  The glue will flake off over the next 2-3 weeks.  Any sutures or staples will be removed at the office during your follow-up visit. °8. ACTIVITIES:  You may resume regular (light) daily activities beginning the next day--such as daily self-care, walking, climbing stairs--gradually increasing activities as tolerated.  You may have sexual intercourse when it is comfortable.  Refrain from any heavy lifting or straining until approved by your doctor. ° °a.You may drive when you are no longer taking prescription pain medication, you can comfortably wear a seatbelt, and you can safely maneuver your car and apply brakes. °b.RETURN TO WORK:   °_____________________________________________ ° °9.You should see your doctor in the office for a follow-up appointment approximately 2-3 weeks after your surgery.  Make sure that you call for this appointment within a day or two after you arrive home to insure a convenient appointment time. °10.OTHER INSTRUCTIONS: _________________________ °   _____________________________________ ° °WHEN TO CALL YOUR DOCTOR: °1. Fever over 101.0 °2. Inability to urinate °3. Nausea and/or vomiting °4. Extreme swelling or bruising °5. Continued bleeding from incision. °6. Increased pain, redness, or drainage from the incision ° °The clinic staff is available to answer your questions during regular business hours.  Please don’t hesitate to call and ask to speak to one of the nurses for clinical concerns.  If you have a medical emergency, go to the nearest emergency room or call 911.  A surgeon from [facility] is always on call at the hospital ° ° °  [address], [address], [place], [place]  [address] ? ° [address] [address], [place], [place]   [address] °[phone] ? [phone] ? FAX [phone] °Web site: [email or web address] °

## 2020-06-26 NOTE — Anesthesia Procedure Notes (Signed)
Procedure Name: Intubation Date/Time: 06/26/2020 7:40 AM Performed by: West Pugh, CRNA Pre-anesthesia Checklist: Patient identified, Emergency Drugs available, Suction available, Patient being monitored and Timeout performed Patient Re-evaluated:Patient Re-evaluated prior to induction Oxygen Delivery Method: Circle system utilized Preoxygenation: Pre-oxygenation with 100% oxygen Induction Type: IV induction Ventilation: Mask ventilation without difficulty Laryngoscope Size: Mac and 4 Grade View: Grade I Tube type: Oral Tube size: 7.5 mm Number of attempts: 1 Airway Equipment and Method: Stylet Placement Confirmation: ETT inserted through vocal cords under direct vision,  positive ETCO2,  CO2 detector and breath sounds checked- equal and bilateral Secured at: 22 cm Tube secured with: Tape Dental Injury: Teeth and Oropharynx as per pre-operative assessment

## 2020-06-27 ENCOUNTER — Encounter (HOSPITAL_COMMUNITY): Payer: Self-pay | Admitting: General Surgery

## 2020-07-03 ENCOUNTER — Encounter: Payer: Self-pay | Admitting: Nurse Practitioner

## 2020-07-03 ENCOUNTER — Ambulatory Visit (INDEPENDENT_AMBULATORY_CARE_PROVIDER_SITE_OTHER): Payer: Medicare Other | Admitting: Nurse Practitioner

## 2020-07-03 ENCOUNTER — Other Ambulatory Visit: Payer: Self-pay

## 2020-07-03 VITALS — BP 128/86 | HR 70 | Temp 98.1°F | Wt 180.0 lb

## 2020-07-03 DIAGNOSIS — K4090 Unilateral inguinal hernia, without obstruction or gangrene, not specified as recurrent: Secondary | ICD-10-CM | POA: Diagnosis not present

## 2020-07-03 DIAGNOSIS — N1831 Chronic kidney disease, stage 3a: Secondary | ICD-10-CM

## 2020-07-03 DIAGNOSIS — K5900 Constipation, unspecified: Secondary | ICD-10-CM | POA: Diagnosis not present

## 2020-07-03 DIAGNOSIS — Z23 Encounter for immunization: Secondary | ICD-10-CM

## 2020-07-03 MED ORDER — LINACLOTIDE 145 MCG PO CAPS: 145.0000 ug | ORAL_CAPSULE | Freq: Every day | ORAL | 1 refills | Status: DC

## 2020-07-03 MED ORDER — PNEUMOCOCCAL 13-VAL CONJ VACC IM SUSP: 0.5000 mL | INTRAMUSCULAR | 0 refills | Status: AC

## 2020-07-03 NOTE — Progress Notes (Signed)
This visit occurred during the SARS-CoV-2 public health emergency.  Safety protocols were in place, including screening questions prior to the visit, additional usage of staff PPE, and extensive cleaning of exam room while observing appropriate contact time as indicated for disinfecting solutions.  Subjective:     Patient ID: Nathan Morse , male    DOB: January 03, 1934 , 86 y.o.   MRN: [id number]   Chief Complaint  Patient presents with  . Follow-up    Hernia repair     HPI  He is here for follow up after having right inguinal hernia repair on 06/26/2020. He is to follow up with the surgeon in August.  He does report having some swelling to the area today. Rates is pain 2/10, has been taking tylenol as needed and has one pain medication (narcotic) left. He has been having difficulty with using he incentive spirometer    Past Medical History:  Diagnosis Date  . Cancer [facility])      Family History  Adopted: Yes  Family history unknown: Yes     Current Outpatient Medications:  .  ibuprofen (ADVIL) 800 MG tablet, Take 1 tablet (800 mg total) by mouth every 8 (eight) hours as needed., Disp: 30 tablet, Rfl: 0 .  linaclotide (LINZESS) 145 MCG CAPS capsule, Take 1 capsule (145 mcg total) by mouth daily before breakfast., Disp: 90 capsule, Rfl: 1 .  mirabegron ER (MYRBETRIQ) 50 MG TB24 tablet, Take 50 mg by mouth at bedtime. , Disp: , Rfl:  .  OVER THE COUNTER MEDICATION, Gentle Stool Softener, Disp: , Rfl:  .  oxyCODONE (OXY IR/ROXICODONE) 5 MG immediate release tablet, Take 1 tablet (5 mg total) by mouth every 6 (six) hours as needed for severe pain., Disp: 15 tablet, Rfl: 0 .  pneumococcal 13-valent conjugate vaccine (PREVNAR 13) SUSP injection, Inject 0.5 mLs into the muscle tomorrow at 10 am for 1 dose., Disp: 0.5 mL, Rfl: 0 .  sildenafil (REVATIO) 20 MG tablet, Take 100 mg by mouth daily as needed (sexual dysfunstion). , Disp: , Rfl:    No Known Allergies   Review of Systems   Constitutional: Negative.   Respiratory: Negative.   Cardiovascular: Negative.  Negative for chest pain, palpitations and leg swelling.  Psychiatric/Behavioral: Negative.      Today's Vitals   07/03/20 1034  BP: 128/86  Pulse: 70  Temp: 98.1 F (36.7 C)  TempSrc: Oral  SpO2: 99%  Weight: 180 lb (81.6 kg)  PainSc: 2   PainLoc: Groin   Body mass index is 23.11 kg/m.   Objective:  Physical Exam Vitals reviewed.  Constitutional:      General: He is not in acute distress.    Appearance: Normal appearance.  Cardiovascular:     Rate and Rhythm: Normal rate and regular rhythm.     Pulses: Normal pulses.     Heart sounds: Normal heart sounds. No murmur heard.   Pulmonary:     Effort: Pulmonary effort is normal. No respiratory distress.     Breath sounds: Normal breath sounds.  Abdominal:     Comments: Mild swelling to right groin, has healing surgical wound with bonding glue  Genitourinary:    Comments: Right groin with healing surgical wound with reproducible swelling, Non tender Neurological:     General: No focal deficit present.     Mental Status: He is alert and oriented to person, place, and time.     Cranial Nerves: No cranial nerve deficit.  Psychiatric:  Mood and Affect: Mood normal.        Behavior: Behavior normal.        Thought Content: Thought content normal.        Judgment: Judgment normal.         Assessment And Plan:     1. Right inguinal hernia Comments: healing well, mild swelling noted, advised to apply ice pack to area and avoid lifting and doing too much activity until appt with Surgeon.  Educated him on the use of the spirometer - BMP8+eGFR  2. Constipation, unspecified constipation type Comments: I have encouraged him to drink at least 4 bottles water a day Also gave samples of Linzess 72 mg Encouraged to increase fiber intake - linaclotide (LINZESS) 145 MCG CAPS capsule; Take 1 capsule (145 mcg total) by mouth daily before  breakfast.  Dispense: 90 capsule; Refill: 1  3. Stage 3a chronic kidney disease Comments: Advised to avoid Ibuprofen or Aleve Drink adequate amounts of water - BMP8+eGFR  4. Encounter for immunization Comments: Prevnar 13 vaccine sent to Pharmacy - pneumococcal 13-valent conjugate vaccine (PREVNAR 13) SUSP injection; Inject 0.5 mLs into the muscle tomorrow at 10 am for 1 dose.  Dispense: 0.5 mL; Refill: 0     Patient was given opportunity to ask questions. Patient verbalized understanding of the plan and was able to repeat key elements of the plan. All questions were answered to their satisfaction.  [name], FNP    I, [name], FNP, have reviewed all documentation for this visit. The documentation on 07/03/20 for the exam, diagnosis, procedures, and orders are all accurate and complete.  THE PATIENT IS ENCOURAGED TO PRACTICE SOCIAL DISTANCING DUE TO THE COVID-19 PANDEMIC.

## 2020-07-18 ENCOUNTER — Ambulatory Visit: Payer: Medicare Other

## 2020-07-18 ENCOUNTER — Ambulatory Visit: Payer: Medicare Other | Admitting: Nurse Practitioner

## 2020-09-03 ENCOUNTER — Encounter: Payer: Self-pay | Admitting: Nurse Practitioner

## 2020-09-03 ENCOUNTER — Ambulatory Visit (INDEPENDENT_AMBULATORY_CARE_PROVIDER_SITE_OTHER): Payer: Medicare Other | Admitting: Nurse Practitioner

## 2020-09-03 ENCOUNTER — Other Ambulatory Visit: Payer: Self-pay

## 2020-09-03 VITALS — BP 118/66 | HR 68 | Temp 97.6°F | Ht 74.0 in | Wt 186.0 lb

## 2020-09-03 DIAGNOSIS — L299 Pruritus, unspecified: Secondary | ICD-10-CM | POA: Diagnosis not present

## 2020-09-03 DIAGNOSIS — Z23 Encounter for immunization: Secondary | ICD-10-CM | POA: Diagnosis not present

## 2020-09-03 DIAGNOSIS — R21 Rash and other nonspecific skin eruption: Secondary | ICD-10-CM

## 2020-09-03 NOTE — Progress Notes (Signed)
I,Yamilka Roman [employer] as a Education administrator for [employer], FNP.,have documented all relevant documentation on the behalf of [name], FNP,as directed by  [name], FNP while in the presence of [name], [place].  This visit occurred during the SARS-CoV-2 public health emergency.  Safety protocols were in place, including screening questions prior to the visit, additional usage of staff PPE, and extensive cleaning of exam room while observing appropriate contact time as indicated for disinfecting solutions.  Subjective:     Patient ID: Nathan Morse , male    DOB: August 25, 1934 , 86 y.o.   MRN: [id number]   Chief Complaint  Patient presents with  . skin lesion    patient stated he has a sore on his back that has been there for a month he stated he has been scratching at it     HPI  He has a place at the top of his head that he would like.   Having itching all over, he does report seeing bed bugs which he has sprayed.  This started one month ago.  Denies being allergic to any foods.  He is not using any new detergents or soaps.   He is also having nasal drainage when he is eating.  He reports will have leaking from his nasal passage.        Past Medical History:  Diagnosis Date  . Cancer [facility])      Family History  Adopted: Yes  Family history unknown: Yes     Current Outpatient Medications:  .  linaclotide (LINZESS) 145 MCG CAPS capsule, Take 1 capsule (145 mcg total) by mouth daily before breakfast., Disp: 90 capsule, Rfl: 1 .  mirabegron ER (MYRBETRIQ) 50 MG TB24 tablet, Take 50 mg by mouth at bedtime. , Disp: , Rfl:    No Known Allergies   Review of Systems  Constitutional: Negative.   Respiratory: Negative.   Cardiovascular: Negative.  Negative for chest pain, palpitations and leg swelling.  Neurological: Negative for dizziness and headaches.  Psychiatric/Behavioral: Negative.      Today's Vitals   09/03/20 1129  BP: 118/66  Pulse: 68  Temp: 97.6 F (36.4  C)  TempSrc: Oral  Weight: 186 lb (84.4 kg)  Height: 6\' 2"  (1.88 m)   Body mass index is 23.88 kg/m.   Objective:  Physical Exam Cardiovascular:     Pulses: Normal pulses.     Heart sounds: Normal heart sounds. No murmur heard.   Skin:    General: Skin is warm and dry.     Findings: Lesion (posterior scalp with irregular shaped lesion, discolored) present. No rash (no rash noted on his arms).  Neurological:     General: No focal deficit present.     Cranial Nerves: No cranial nerve deficit.  Psychiatric:        Mood and Affect: Mood normal.        Behavior: Behavior normal.        Thought Content: Thought content normal.        Judgment: Judgment normal.         Assessment And Plan:     1. Rash and nonspecific skin eruption  Irregular shaped lesion to posterior scalp, discoloration present  Will refer to dermatology for further evaluation - Ambulatory referral to Dermatology  2. Pruritus  No obvious rash, I have advised him to take zyrtec at bedtime to see if this helps  Also advised to have an exterminator to come check for bed bugs -  Allergens(96) Foods  3. Need for influenza vaccination  Influenza vaccine administered  Encouraged to take Tylenol as needed for fever or muscle aches. - Flu Vaccine QUAD High Dose(Fluad)     Patient was given opportunity to ask questions. Patient verbalized understanding of the plan and was able to repeat key elements of the plan. All questions were answered to their satisfaction.  [name], FNP   I, [name], FNP, have reviewed all documentation for this visit. The documentation on 09/03/20 for the exam, diagnosis, procedures, and orders are all accurate and complete.  THE PATIENT IS ENCOURAGED TO PRACTICE SOCIAL DISTANCING DUE TO THE COVID-19 PANDEMIC.

## 2020-09-05 LAB — ALLERGENS(96) FOODS
Allergen Apple, IgE: <0.1 kU/L
Allergen Banana IgE: <0.1 kU/L
Allergen Barley IgE: <0.1 kU/L
Allergen Black Pepper IgE: <0.1 kU/L
Allergen Blueberry IgE: <0.1 kU/L
Allergen Broccoli: <0.1 kU/L
Allergen Cabbage IgE: <0.1 kU/L
Allergen Carrot IgE: <0.1 kU/L
Allergen Cauliflower IgE: <0.1 kU/L
Allergen Celery IgE: <0.1 kU/L
Allergen Cinnamon IgE: <0.1 kU/L
Allergen Coconut IgE: <0.1 kU/L
Allergen Corn, IgE: <0.1 kU/L
Allergen Cucumber IgE: <0.1 kU/L
Allergen Garlic IgE: <0.1 kU/L
Allergen Ginger IgE: <0.1 kU/L
Allergen Gluten IgE: <0.1 kU/L
Allergen Grape IgE: <0.1 kU/L
Allergen Grapefruit IgE: <0.1 kU/L
Allergen Green Bean IgE: <0.1 kU/L
Allergen Green Bell Pepper IgE: <0.1 kU/L
Allergen Green Pea IgE: <0.1 kU/L
Allergen Lamb IgE: <0.1 kU/L
Allergen Lettuce IgE: <0.1 kU/L
Allergen Lime IgE: <0.1 kU/L
Allergen Melon IgE: <0.1 kU/L
Allergen Oat IgE: <0.1 kU/L
Allergen Onion IgE: <0.1 kU/L
Allergen Pear IgE: <0.1 kU/L
Allergen Potato, White IgE: <0.1 kU/L
Allergen Rice IgE: <0.1 kU/L
Allergen Salmon IgE: <0.1 kU/L
Allergen Strawberry IgE: <0.1 kU/L
Allergen Sweet Potato IgE: <0.1 kU/L
Allergen Tomato, IgE: <0.1 kU/L
Allergen Turkey IgE: <0.1 kU/L
Allergen Watermelon IgE: <0.1 kU/L
Allergen, Peach f95: <0.1 kU/L
Basil: <0.1 kU/L
Beef IgE: <0.1 kU/L
C074-IgE Gelatin: <0.1 kU/L
Chicken IgE: <0.1 kU/L
Chocolate/Cacao IgE: <0.1 kU/L
Clam IgE: 0.11 kU/L — AB
Codfish IgE: <0.1 kU/L
Coffee: <0.1 kU/L
Cranberry IgE: <0.1 kU/L
Egg White IgE: <0.1 kU/L
F020-IgE Almond: <0.1 kU/L
F023-IgE Crab: <0.1 kU/L
F045-IgE Yeast: <0.1 kU/L
F076-IgE Alpha Lactalbumin: <0.1 kU/L
F077-IgE Beta Lactoglobulin: <0.1 kU/L
F078-IgE Casein: <0.1 kU/L
F080-IgE Lobster: <0.1 kU/L
F081-IgE Cheese, Cheddar Type: <0.1 kU/L
F089-IgE Mustard: <0.1 kU/L
F096-IgE Avocado: <0.1 kU/L
F202-IgE Cashew Nut: <0.1 kU/L
F214-IgE Spinach: <0.1 kU/L
F222-IgE Tea: <0.1 kU/L
F242-IgE Bing Cherry: <0.1 kU/L
F247-IgE Honey: <0.1 kU/L
F261-IgE Asparagus: <0.1 kU/L
F262-IgE Eggplant: <0.1 kU/L
F265-IgE Cumin: <0.1 kU/L
F278-IgE Bayleaf (Laurel): <0.1 kU/L
F279-IgE Chili Pepper: <0.1 kU/L
F283-IgE Oregano: <0.1 kU/L
F300-IgE Goat's Milk: <0.1 kU/L
F342-IgE Olive, Black: <0.1 kU/L
F343-IgE Raspberry: <0.1 kU/L
Hops: <0.1 kU/L
IgE Egg (Yolk): <0.1 kU/L
Kidney Bean IgE: <0.1 kU/L
Lemon: <0.1 kU/L
Lima Bean IgE: <0.1 kU/L
Malt: <0.1 kU/L
Mushroom IgE: <0.1 kU/L
Orange: <0.1 kU/L
Peanut IgE: <0.1 kU/L
Pineapple IgE: <0.1 kU/L
Pork IgE: <0.1 kU/L
Pumpkin IgE: <0.1 kU/L
Red Beet: <0.1 kU/L
Rye IgE: <0.1 kU/L
Scallop IgE: <0.1 kU/L
Sesame Seed IgE: <0.1 kU/L
Shrimp IgE: <0.1 kU/L
Soybean IgE: <0.1 kU/L
Tuna: <0.1 kU/L
Vanilla: <0.1 kU/L
Walnut IgE: <0.1 kU/L
Wheat IgE: <0.1 kU/L
Whey: <0.1 kU/L
White Bean IgE: <0.1 kU/L

## 2020-09-19 ENCOUNTER — Other Ambulatory Visit: Payer: Self-pay

## 2020-09-19 ENCOUNTER — Encounter: Payer: Self-pay | Admitting: Podiatry

## 2020-09-19 ENCOUNTER — Ambulatory Visit: Payer: Medicare Other | Admitting: Podiatry

## 2020-09-19 DIAGNOSIS — M79675 Pain in left toe(s): Secondary | ICD-10-CM | POA: Diagnosis not present

## 2020-09-19 DIAGNOSIS — M79674 Pain in right toe(s): Secondary | ICD-10-CM

## 2020-09-19 DIAGNOSIS — B351 Tinea unguium: Secondary | ICD-10-CM | POA: Diagnosis not present

## 2020-09-19 DIAGNOSIS — L84 Corns and callosities: Secondary | ICD-10-CM | POA: Diagnosis not present

## 2020-09-19 DIAGNOSIS — M2042 Other hammer toe(s) (acquired), left foot: Secondary | ICD-10-CM

## 2020-09-19 DIAGNOSIS — M2041 Other hammer toe(s) (acquired), right foot: Secondary | ICD-10-CM

## 2020-09-19 DIAGNOSIS — N1831 Chronic kidney disease, stage 3a: Secondary | ICD-10-CM

## 2020-09-23 NOTE — Progress Notes (Signed)
Subjective:  Patient ID: Nathan Morse, male    DOB: 1934/11/11,  MRN: [id number]  86 y.o. male presents with at risk foot care. Patient has h/o CKD stage 3 and painful corn(s) b/l feet and painful thick toenails that are difficult to trim. Painful toenails interfere with ambulation. Aggravating factors include wearing enclosed shoe gear. Pain is relieved with periodic professional debridement. Painful corns are aggravated when weightbearing when wearing enclosed shoe gear. Pain is relieved with periodic professional debridement.   He voices no new pedal problems on today's visit.   PCP is [name], FNP and last visit was 07/03/2020.  Review of Systems: Negative except as noted in the HPI. Denies N/V/F/Ch. Past Medical History:  Diagnosis Date   Cancer [facility])    Past Surgical History:  Procedure Laterality Date   APPENDECTOMY     INGUINAL HERNIA REPAIR Right 06/26/2020   Procedure: right open inguinal hernia repair with mesh;  Surgeon: Kinsinger, Arta Bruce, MD;  Location: [facility];  Service: General;  Laterality: Right;   PENILE BIOPSY     SHOULDER ARTHROSCOPY     TONSILLECTOMY     TONSILLECTOMY AND ADENOIDECTOMY     Patient Active Problem List   Diagnosis Date Noted   Muscle spasm 05/19/2019   Corns and callosities 05/19/2019   Pain of toe of left foot 05/19/2019   Urinary tract infection with hematuria 03/22/2019   Vitamin D deficiency 03/22/2019   Dysphonia 03/24/2017    Current Outpatient Medications:    linaclotide (LINZESS) 145 MCG CAPS capsule, Take 1 capsule (145 mcg total) by mouth daily before breakfast., Disp: 90 capsule, Rfl: 1   mirabegron ER (MYRBETRIQ) 50 MG TB24 tablet, Take 50 mg by mouth at bedtime. , Disp: , Rfl:  No Known Allergies Social History   Tobacco Use  Smoking Status Never Smoker  Smokeless Tobacco Current User   Types: Chew  Tobacco Comment   wants to quit, but hard    Objective:   Constitutional Pt is a pleasant 86 y.o.  African American male, WD, WN in NAD.Marland Kitchen  Vascular Neurovascular status unchanged b/l lower extremities. Capillary refill time to digits immediate b/l. Palpable pedal pulses b/l LE. Pedal hair absent. Lower extremity skin temperature gradient within normal limits. No pain with calf compression b/l. No cyanosis or clubbing noted.  Neurologic Normal speech. Oriented to person, place, and time. Protective sensation intact 5/5 intact bilaterally with 10g monofilament b/l. Vibratory sensation intact b/l.  Dermatologic Pedal skin with normal turgor, texture and tone bilaterally. No open wounds bilaterally. No interdigital macerations bilaterally. Toenails 1-5 b/l elongated, discolored, dystrophic, thickened, crumbly with subungual debris and tenderness to dorsal palpation. Hyperkeratotic lesion(s) L 5th toe and R 5th toe PIPJ and Lister's corns b/l 5th digits.  No erythema, no edema, no drainage, no flocculence.  Orthopedic: Normal muscle strength 5/5 to all lower extremity muscle groups bilaterally. No pain crepitus or joint limitation noted with ROM b/l. Hammertoes noted to the L 5th toe and R 5th toe.   Radiographs: None Assessment:   1. Pain due to onychomycosis of toenails of both feet   2. Corns   3. Pain in toes of both feet   4. Acquired hammertoes of both feet   5. Stage 3a chronic kidney disease ([place])    Plan:  Patient was evaluated and treated and all questions answered.  Onychomycosis with pain -Nails palliatively debridement as below. -Educated on self-care  Procedure: Nail Debridement Rationale: Pain Type of Debridement: manual, sharp debridement.  Instrumentation: Nail nipper, rotary burr. Number of Nails: 10  -Examined patient. -Toenails 1-5 b/l were debrided in length and girth with sterile nail nippers and dremel without iatrogenic bleeding.  -Toenails 1-5 bilaterally debrided in length and girth without iatrogenic bleeding with sterile nail nipper and dremel.  -Corn(s) L 5th  toe and R 5th toe PIPJ and Lister's corns b/l 5th digits pared utilizing sterile scalpel blade without complication or incident. Total number debrided=4. -Patient to report any pedal injuries to medical professional immediately. -Patient to continue soft, supportive shoe gear daily. -Patient/POA to call should there be question/concern in the interim.  Return in about 3 months (around 12/20/2020).  Marzetta Board, DPM

## 2020-10-10 DIAGNOSIS — D485 Neoplasm of uncertain behavior of skin: Secondary | ICD-10-CM | POA: Diagnosis not present

## 2020-10-10 DIAGNOSIS — L820 Inflamed seborrheic keratosis: Secondary | ICD-10-CM | POA: Diagnosis not present

## 2020-10-15 ENCOUNTER — Other Ambulatory Visit: Payer: Self-pay

## 2020-10-15 DIAGNOSIS — K5900 Constipation, unspecified: Secondary | ICD-10-CM

## 2020-10-15 MED ORDER — LINACLOTIDE 145 MCG PO CAPS: 145.0000 ug | ORAL_CAPSULE | Freq: Every day | ORAL | 1 refills | Status: DC

## 2020-10-16 ENCOUNTER — Encounter: Payer: Self-pay | Admitting: Nurse Practitioner

## 2020-10-16 DIAGNOSIS — L820 Inflamed seborrheic keratosis: Secondary | ICD-10-CM

## 2020-10-16 HISTORY — DX: Inflamed seborrheic keratosis: L82.0

## 2020-10-23 ENCOUNTER — Ambulatory Visit (INDEPENDENT_AMBULATORY_CARE_PROVIDER_SITE_OTHER): Payer: Medicare Other | Admitting: Nurse Practitioner

## 2020-10-23 ENCOUNTER — Other Ambulatory Visit: Payer: Self-pay

## 2020-10-23 ENCOUNTER — Encounter: Payer: Self-pay | Admitting: Nurse Practitioner

## 2020-10-23 ENCOUNTER — Ambulatory Visit
Admission: RE | Admit: 2020-10-23 | Discharge: 2020-10-23 | Disposition: A | Discharge status: Home / Self Care | Payer: Medicare Other | Source: Ambulatory Visit | Attending: Nurse Practitioner | Admitting: Nurse Practitioner

## 2020-10-23 VITALS — BP 132/80 | HR 82 | Temp 98.4°F | Ht 69.8 in | Wt 187.4 lb

## 2020-10-23 DIAGNOSIS — M545 Low back pain, unspecified: Secondary | ICD-10-CM | POA: Diagnosis not present

## 2020-10-23 LAB — POCT URINALYSIS DIPSTICK
Bilirubin, UA: NEGATIVE
Blood, UA: NEGATIVE
Glucose, UA: NEGATIVE
Ketones, UA: NEGATIVE
Leukocytes, UA: NEGATIVE
Nitrite, UA: NEGATIVE
Protein, UA: NEGATIVE
Spec Grav, UA: >=1.03 — AB (ref 1.010–1.025)
Urobilinogen, UA: 1 E.U./dL
pH, UA: 6 (ref 5.0–8.0)

## 2020-10-23 MED ORDER — DICLOFENAC SODIUM 1 % EX GEL
2.0000 g | Freq: Four times a day (QID) | CUTANEOUS | 2 refills | Status: DC
Start: 2020-10-23 — End: 2021-04-23

## 2020-10-23 NOTE — Progress Notes (Signed)
This visit occurred during the SARS-CoV-2 public health emergency.  Safety protocols were in place, including screening questions prior to the visit, additional usage of staff PPE, and extensive cleaning of exam room while observing appropriate contact time as indicated for disinfecting solutions.  Subjective:     Patient ID: Nathan Morse , male    DOB: December 22, 1933 , 86 y.o.   MRN: [id number]   Chief Complaint  Patient presents with  . Referral    patient stated he would like a referral for his back. he has been unable to bend over or nothing    HPI  Patient states he is unable to reach up.  He had prostate cancer 11 years ago and continues to see the Urologist once a year.    Back Pain This is a new problem. The current episode started 1 to 4 weeks ago. The problem occurs constantly. The pain is present in the lumbar spine. The quality of the pain is described as aching. Pertinent negatives include no headaches or numbness.     Past Medical History:  Diagnosis Date  . Cancer ([place])   . Seborrheic keratosis, inflamed 10/16/2020   Skin Surgery      Family History  Adopted: Yes  Family history unknown: Yes     Current Outpatient Medications:  .  linaclotide (LINZESS) 145 MCG CAPS capsule, Take 1 capsule (145 mcg total) by mouth daily before breakfast., Disp: 90 capsule, Rfl: 1 .  mirabegron ER (MYRBETRIQ) 50 MG TB24 tablet, Take 50 mg by mouth at bedtime. , Disp: , Rfl:  .  diclofenac Sodium (VOLTAREN) 1 % GEL, Apply 2 g topically 4 (four) times daily., Disp: 100 g, Rfl: 2   No Known Allergies   Review of Systems  Constitutional: Negative.   Respiratory: Negative.   Cardiovascular: Negative.   Musculoskeletal: Positive for back pain. Negative for myalgias.  Neurological: Negative for dizziness, numbness and headaches.  Psychiatric/Behavioral: Negative.      Today's Vitals   10/23/20 1137  BP: 132/80  Pulse: 82  Temp: 98.4 F (36.9 C)  Weight: 187 lb 6.4 oz (85 kg)   Height: 5' 9.8" (1.773 m)  PainSc: 0-No pain   Body mass index is 27.04 kg/m.   Objective:  Physical Exam Vitals reviewed.  Constitutional:      General: He is not in acute distress.    Appearance: Normal appearance.  Pulmonary:     Effort: Pulmonary effort is normal. No respiratory distress.  Abdominal:     Tenderness: There is no right CVA tenderness or left CVA tenderness.  Neurological:     General: No focal deficit present.     Mental Status: He is alert and oriented to person, place, and time.     Cranial Nerves: No cranial nerve deficit.  Psychiatric:        Mood and Affect: Mood normal.        Behavior: Behavior normal.        Thought Content: Thought content normal.        Judgment: Judgment normal.         Assessment And Plan:     1. Acute left-sided low back pain without sciatica  Negative pain on palpation   He is stiff when moving and rotating  Will send for lumbar spine xray  Offered to give him a Toradol injection and he declined  Pending results of his xray will refer to PT or orthopedics.  - POCT Urinalysis Dipstick (81002) - DG  Lumbar Spine Complete; Future     Patient was given opportunity to ask questions. Patient verbalized understanding of the plan and was able to repeat key elements of the plan. All questions were answered to their satisfaction.    Teola Bradley, FNP, have reviewed all documentation for this visit. The documentation on 10/23/20 for the exam, diagnosis, procedures, and orders are all accurate and complete.  THE PATIENT IS ENCOURAGED TO PRACTICE SOCIAL DISTANCING DUE TO THE COVID-19 PANDEMIC.

## 2020-11-28 ENCOUNTER — Encounter: Payer: Self-pay | Admitting: Nurse Practitioner

## 2020-11-28 ENCOUNTER — Ambulatory Visit (INDEPENDENT_AMBULATORY_CARE_PROVIDER_SITE_OTHER): Payer: Medicare Other | Admitting: Nurse Practitioner

## 2020-11-28 ENCOUNTER — Ambulatory Visit (INDEPENDENT_AMBULATORY_CARE_PROVIDER_SITE_OTHER): Payer: Medicare Other

## 2020-11-28 ENCOUNTER — Other Ambulatory Visit: Payer: Self-pay

## 2020-11-28 ENCOUNTER — Ambulatory Visit: Payer: Medicare Other

## 2020-11-28 VITALS — BP 150/86 | HR 63 | Temp 97.9°F | Ht 70.0 in | Wt 187.2 lb

## 2020-11-28 VITALS — BP 150/86 | HR 63 | Temp 97.7°F | Ht 70.0 in | Wt 187.2 lb

## 2020-11-28 DIAGNOSIS — Z23 Encounter for immunization: Secondary | ICD-10-CM | POA: Diagnosis not present

## 2020-11-28 DIAGNOSIS — N5234 Erectile dysfunction following simple prostatectomy: Secondary | ICD-10-CM | POA: Diagnosis not present

## 2020-11-28 DIAGNOSIS — N1831 Chronic kidney disease, stage 3a: Secondary | ICD-10-CM | POA: Diagnosis not present

## 2020-11-28 DIAGNOSIS — R030 Elevated blood-pressure reading, without diagnosis of hypertension: Secondary | ICD-10-CM

## 2020-11-28 DIAGNOSIS — Z0000 Encounter for general adult medical examination without abnormal findings: Secondary | ICD-10-CM | POA: Diagnosis not present

## 2020-11-28 MED ORDER — PREVNAR 13 IM SUSP: 0.5000 mL | INTRAMUSCULAR | 0 refills | Status: AC

## 2020-11-28 MED ORDER — SILDENAFIL CITRATE 20 MG PO TABS: 20.0000 mg | ORAL_TABLET | ORAL | 5 refills | Status: DC | PRN

## 2020-11-28 NOTE — Progress Notes (Signed)
This visit occurred during the SARS-CoV-2 public health emergency.  Safety protocols were in place, including screening questions prior to the visit, additional usage of staff PPE, and extensive cleaning of exam room while observing appropriate contact time as indicated for disinfecting solutions.  Subjective:   Nathan Morse is a 86 y.o. male who presents for Medicare Annual/Subsequent preventive examination.  Review of Systems     Cardiac Risk Factors include: advanced age (>65men, >58 women)     Objective:    Today's Vitals   11/28/20 0959  BP: (!) 150/86  Pulse: 63  Temp: 97.7 F (36.5 C)  TempSrc: Oral  SpO2: 99%  Weight: 187 lb 3.2 oz (84.9 kg)  Height: 5\' 10"  (1.778 m)   Body mass index is 26.86 kg/m.  Advanced Directives 11/28/2020 06/21/2020 07/12/2019 11/17/2018 05/28/2017  Does Patient Have a Medical Advance Directive? Yes No No Yes No  Type of Paramedic of [place];Living will - - - -  Does patient want to make changes to medical advance directive? - - - No - Patient declined -  Copy of [place] in Chart? No - copy requested - - - -  Would patient like information on creating a medical advance directive? - No - Patient declined No - Patient declined - -    Current Medications (verified) Outpatient Encounter Medications as of 11/28/2020  Medication Sig   linaclotide (LINZESS) 145 MCG CAPS capsule Take 1 capsule (145 mcg total) by mouth daily before breakfast.   mirabegron ER (MYRBETRIQ) 50 MG TB24 tablet Take 50 mg by mouth at bedtime.    diclofenac Sodium (VOLTAREN) 1 % GEL Apply 2 g topically 4 (four) times daily. (Patient not taking: Reported on 11/28/2020)   [DISCONTINUED] ibuprofen (ADVIL) 800 MG tablet Take 1 tablet (800 mg total) by mouth every 8 (eight) hours as needed. (Patient not taking: Reported on 09/03/2020)   [DISCONTINUED] linaclotide (LINZESS) 145 MCG CAPS capsule Take 1 capsule (145 mcg total) by mouth  daily before breakfast.   [DISCONTINUED] OVER THE COUNTER MEDICATION Gentle Stool Softener (Patient not taking: Reported on 09/03/2020)   [DISCONTINUED] oxyCODONE (OXY IR/ROXICODONE) 5 MG immediate release tablet Take 1 tablet (5 mg total) by mouth every 6 (six) hours as needed for severe pain. (Patient not taking: Reported on 09/03/2020)   [DISCONTINUED] sildenafil (REVATIO) 20 MG tablet Take 100 mg by mouth daily as needed (sexual dysfunstion).  (Patient not taking: Reported on 09/03/2020)   No facility-administered encounter medications on file as of 11/28/2020.    Allergies (verified) Patient has no known allergies.   History: Past Medical History:  Diagnosis Date   Cancer ([place])    Seborrheic keratosis, inflamed 10/16/2020   Skin Surgery    Past Surgical History:  Procedure Laterality Date   APPENDECTOMY     INGUINAL HERNIA REPAIR Right 06/26/2020   Procedure: right open inguinal hernia repair with mesh;  Surgeon: Kinsinger, Arta Bruce, MD;  Location: [facility];  Service: General;  Laterality: Right;   PENILE BIOPSY     SHOULDER ARTHROSCOPY     TONSILLECTOMY     TONSILLECTOMY AND ADENOIDECTOMY     Family History  Adopted: Yes  Family history unknown: Yes   Social History   Socioeconomic History   Marital status: Widowed    Spouse name: Not on file   Number of children: Not on file   Years of education: Not on file   Highest education level: Not on file  Occupational History  Occupation: retired  Tobacco Use   Smoking status: Never Smoker   Smokeless tobacco: Current User    Types: Chew   Tobacco comment: wants to quit, but hard  Vaping Use   Vaping Use: Never used  Substance and Sexual Activity   Alcohol use: No    Alcohol/week: 0.0 standard drinks   Drug use: No   Sexual activity: Yes  Other Topics Concern   Not on file  Social History Narrative   Not on file   Social Determinants of Health   Financial Resource Strain: Low Risk     Difficulty of Paying Living Expenses: Not hard at all  Food Insecurity: No Food Insecurity   Worried About Charity fundraiser in the Last Year: Never true   [place] in the Last Year: Never true  Transportation Needs: No Transportation Needs   Lack of Transportation (Medical): No   Lack of Transportation (Non-Medical): No  Physical Activity: Inactive   Days of Exercise per Week: 0 days   Minutes of Exercise per Session: 0 min  Stress: No Stress Concern Present   Feeling of Stress : Not at all  Social Connections: Not on file    Tobacco Counseling Ready to quit: No Counseling given: Not Answered Comment: wants to quit, but hard   Clinical Intake:  Pre-visit preparation completed: Yes  Pain : No/denies pain     Nutritional Status: BMI 25 -29 Overweight Nutritional Risks: None  How often do you need to have someone help you when you read instructions, pamphlets, or other written materials from your doctor or pharmacy?: 5 - Always What is the last grade level you completed in school?: 4th grade  Diabetic? no  Interpreter Needed?: No  Information entered by :: NAllen LPN   Activities of Daily Living In your present state of health, do you have any difficulty performing the following activities: 11/28/2020 06/21/2020  Hearing? N N  Vision? N N  Difficulty concentrating or making decisions? Y Y  Comment - Short term memory deficit  Walking or climbing stairs? N N  Dressing or bathing? N N  Doing errands, shopping? N N  Preparing Food and eating ? N -  Using the Toilet? N -  In the past six months, have you accidently leaked urine? N -  Do you have problems with loss of bowel control? N -  Managing your Medications? N -  Managing your Finances? N -  Housekeeping or managing your Housekeeping? N -  Some recent data might be hidden    Patient Care Team: [name], FNP as PCP - General (General Practice)  Indicate any recent Medical Services you  may have received from other than [facility] providers in the past year (date may be approximate).     Assessment:   This is a routine wellness examination for [name].  Hearing/Vision screen No exam data present  Dietary issues and exercise activities discussed: Current Exercise Habits: The patient does not participate in regular exercise at present  Goals      DIET - [place] (pt-stated)      DIET - INCREASE WATER INTAKE      07/12/2019, wants to increase to at least 5 bottles daily      Patient Stated      11/28/2020, no goals      Depression Screen PHQ 2/9 Scores 11/28/2020 11/24/2019 09/29/2019 07/12/2019 05/19/2019 11/17/2018 10/02/2015  PHQ - 2 Score 0 0 0 0 0 0 0  PHQ- 9 Score - - - 9 - 9 -    Fall Risk Fall Risk  11/28/2020 11/24/2019 09/29/2019 07/12/2019 05/19/2019  Falls in the past year? 0 0 0 0 0  Number falls in past yr: - - - - -  Comment - - - - -  Injury with Fall? - - - - -  Risk for fall due to : Medication side effect - - Medication side effect -  Follow up Falls evaluation completed;Falls prevention discussed;Education provided - - Falls evaluation completed;Education provided;Falls prevention discussed -    FALL RISK PREVENTION PERTAINING TO THE HOME:  Any stairs in or around the home? Yes  If so, are there any without handrails? No  Home free of loose throw rugs in walkways, pet beds, electrical cords, etc? Yes  Adequate lighting in your home to reduce risk of falls? Yes   ASSISTIVE DEVICES UTILIZED TO PREVENT FALLS:  Life alert? No  Use of a cane, walker or w/c? No  Grab bars in the bathroom? Yes  Shower chair or bench in shower? Yes  Elevated toilet seat or a handicapped toilet? No   TIMED UP AND GO:  Was the test performed? No .     Gait slow and steady without use of assistive device  Cognitive Function:     6CIT Screen 11/28/2020 07/12/2019 11/17/2018  What Year? 4 points 0 points 0 points  What month? 0 points 0 points 0  points  What time? 0 points 0 points 0 points  Count back from 20 2 points 2 points 2 points  Months in reverse 4 points 4 points 4 points  Repeat phrase 10 points 0 points 6 points  Total Score 20 6 12     Immunizations Immunization History  Administered Date(s) Administered   Fluad Quad(high Dose 65+) 08/23/2019, 09/03/2020   Influenza Nasal 08/23/2019   Influenza, High Dose Seasonal PF 11/17/2018   Moderna Sars-Covid-2 Vaccination 01/28/2020, 02/25/2020   Tdap 05/21/2012   Zoster Recombinat (Shingrix) 06/15/2017, 08/17/2017    TDAP status: Up to date  Flu Vaccine status: Up to date  Pneumococcal vaccine status: sent to pharmacy  Covid-19 vaccine status: Completed vaccines  Qualifies for Shingles Vaccine? Yes   Zostavax completed No   Shingrix Completed?: Yes  Screening Tests Health Maintenance  Topic Date Due   PNA vac Low Risk Adult (1 of 2 - PCV13) Never done   COVID-19 Vaccine (3 - Moderna risk 4-dose series) 03/24/2020   TETANUS/TDAP  05/21/2022   INFLUENZA VACCINE  Completed    Health Maintenance  Health Maintenance Due  Topic Date Due   PNA vac Low Risk Adult (1 of 2 - PCV13) Never done   COVID-19 Vaccine (3 - Moderna risk 4-dose series) 03/24/2020    Colorectal cancer screening: No longer required.   Lung Cancer Screening: (Low Dose CT Chest recommended if Age 84-80 years, 30 pack-year currently smoking OR have quit w/in 15years.) does not qualify.   Lung Cancer Screening Referral: no   Additional Screening:  Hepatitis C Screening: does not qualify;  Vision Screening: Recommended annual ophthalmology exams for early detection of glaucoma and other disorders of the eye. Is the patient up to date with their annual eye exam?  Yes  Who is the provider or what is the name of the office in which the patient attends annual eye exams? Can't remember If pt is not established with a provider, would they like to be referred to a provider to  establish  care? No .   Dental Screening: Recommended annual dental exams for proper oral hygiene  Community Resource Referral / Chronic Care Management: CRR required this visit?  No   CCM required this visit?  No      Plan:     I have personally reviewed and noted the following in the patients chart:    Medical and social history  Use of alcohol, tobacco or illicit drugs   Current medications and supplements  Functional ability and status  Nutritional status  Physical activity  Advanced directives  List of other physicians  Hospitalizations, surgeries, and ER visits in previous 12 months  Vitals  Screenings to include cognitive, depression, and falls  Referrals and appointments  In addition, I have reviewed and discussed with patient certain preventive protocols, quality metrics, and best practice recommendations. A written personalized care plan for preventive services as well as general preventive health recommendations were provided to patient.     Kellie Simmering, LPN   [id number]   Nurse Notes:

## 2020-11-28 NOTE — Patient Instructions (Signed)
Mr. Nathan Morse , Thank you for taking time to come for your Medicare Wellness Visit. I appreciate your ongoing commitment to your health goals. Please review the following plan we discussed and let me know if I can assist you in the future.   Screening recommendations/referrals: Colonoscopy: not required Recommended yearly ophthalmology/optometry visit for glaucoma screening and checkup Recommended yearly dental visit for hygiene and checkup  Vaccinations: Influenza vaccine: completed 09/03/2020, due 07/15/2021 Pneumococcal vaccine: sent to pharmacy Tdap vaccine: completed 05/21/2012, due 05/21/2022 Shingles vaccine: completed    Covid-19:  02/25/2020, 01/28/2020  Advanced directives: Please bring a copy of your POA (Power of Attorney) and/or Living Will to your next appointment.   Conditions/risks identified: chews tobacco  Next appointment: Follow up in one year for your annual wellness visit.   Preventive Care 66 Years and Older, Male Preventive care refers to lifestyle choices and visits with your health care provider that can promote health and wellness. What does preventive care include?  A yearly physical exam. This is also called an annual well check.  Dental exams once or twice a year.  Routine eye exams. Ask your health care provider how often you should have your eyes checked.  Personal lifestyle choices, including:  Daily care of your teeth and gums.  Regular physical activity.  Eating a healthy diet.  Avoiding tobacco and drug use.  Limiting alcohol use.  Practicing safe sex.  Taking low doses of aspirin every day.  Taking vitamin and mineral supplements as recommended by your health care provider. What happens during an annual well check? The services and screenings done by your health care provider during your annual well check will depend on your age, overall health, lifestyle risk factors, and family history of disease. Counseling  Your health care provider may  ask you questions about your:  Alcohol use.  Tobacco use.  Drug use.  Emotional well-being.  Home and relationship well-being.  Sexual activity.  Eating habits.  History of falls.  Memory and ability to understand (cognition).  Work and work Statistician. Screening  You may have the following tests or measurements:  Height, weight, and BMI.  Blood pressure.  Lipid and cholesterol levels. These may be checked every 5 years, or more frequently if you are over 43 years old.  Skin check.  Lung cancer screening. You may have this screening every year starting at age 28 if you have a 30-pack-year history of smoking and currently smoke or have quit within the past 15 years.  Fecal occult blood test (FOBT) of the stool. You may have this test every year starting at age 45.  Flexible sigmoidoscopy or colonoscopy. You may have a sigmoidoscopy every 5 years or a colonoscopy every 10 years starting at age 70.  Prostate cancer screening. Recommendations will vary depending on your family history and other risks.  Hepatitis C blood test.  Hepatitis B blood test.  Sexually transmitted disease (STD) testing.  Diabetes screening. This is done by checking your blood sugar (glucose) after you have not eaten for a while (fasting). You may have this done every 1-3 years.  Abdominal aortic aneurysm (AAA) screening. You may need this if you are a current or former smoker.  Osteoporosis. You may be screened starting at age 46 if you are at high risk. Talk with your health care provider about your test results, treatment options, and if necessary, the need for more tests. Vaccines  Your health care provider may recommend certain vaccines, such as:  Influenza  vaccine. This is recommended every year.  Tetanus, diphtheria, and acellular pertussis (Tdap, Td) vaccine. You may need a Td booster every 10 years.  Zoster vaccine. You may need this after age 53.  Pneumococcal 13-valent  conjugate (PCV13) vaccine. One dose is recommended after age 8.  Pneumococcal polysaccharide (PPSV23) vaccine. One dose is recommended after age 48. Talk to your health care provider about which screenings and vaccines you need and how often you need them. This information is not intended to replace advice given to you by your health care provider. Make sure you discuss any questions you have with your health care provider. Document Released: 12/28/2015 Document Revised: 08/20/2016 Document Reviewed: 10/02/2015 Elsevier Interactive Patient Education  2017 [place] Prevention in the Home Falls can cause injuries. They can happen to people of all ages. There are many things you can do to make your home safe and to help prevent falls. What can I do on the outside of my home?  Regularly fix the edges of walkways and driveways and fix any cracks.  Remove anything that might make you trip as you walk through a door, such as a raised step or threshold.  Trim any bushes or trees on the path to your home.  Use bright outdoor lighting.  Clear any walking paths of anything that might make someone trip, such as rocks or tools.  Regularly check to see if handrails are loose or broken. Make sure that both sides of any steps have handrails.  Any raised decks and porches should have guardrails on the edges.  Have any leaves, snow, or ice cleared regularly.  Use sand or salt on walking paths during winter.  Clean up any spills in your garage right away. This includes oil or grease spills. What can I do in the bathroom?  Use night lights.  Install grab bars by the toilet and in the tub and shower. Do not use towel bars as grab bars.  Use non-skid mats or decals in the tub or shower.  If you need to sit down in the shower, use a plastic, non-slip stool.  Keep the floor dry. Clean up any water that spills on the floor as soon as it happens.  Remove soap buildup in the tub or  shower regularly.  Attach bath mats securely with double-sided non-slip rug tape.  Do not have throw rugs and other things on the floor that can make you trip. What can I do in the bedroom?  Use night lights.  Make sure that you have a light by your bed that is easy to reach.  Do not use any sheets or blankets that are too big for your bed. They should not hang down onto the floor.  Have a firm chair that has side arms. You can use this for support while you get dressed.  Do not have throw rugs and other things on the floor that can make you trip. What can I do in the kitchen?  Clean up any spills right away.  Avoid walking on wet floors.  Keep items that you use a lot in easy-to-reach places.  If you need to reach something above you, use a strong step stool that has a grab bar.  Keep electrical cords out of the way.  Do not use floor polish or wax that makes floors slippery. If you must use wax, use non-skid floor wax.  Do not have throw rugs and other things on the floor that can  make you trip. What can I do with my stairs?  Do not leave any items on the stairs.  Make sure that there are handrails on both sides of the stairs and use them. Fix handrails that are broken or loose. Make sure that handrails are as long as the stairways.  Check any carpeting to make sure that it is firmly attached to the stairs. Fix any carpet that is loose or worn.  Avoid having throw rugs at the top or bottom of the stairs. If you do have throw rugs, attach them to the floor with carpet tape.  Make sure that you have a light switch at the top of the stairs and the bottom of the stairs. If you do not have them, ask someone to add them for you. What else can I do to help prevent falls?  Wear shoes that:  Do not have high heels.  Have rubber bottoms.  Are comfortable and fit you well.  Are closed at the toe. Do not wear sandals.  If you use a stepladder:  Make sure that it is fully  opened. Do not climb a closed stepladder.  Make sure that both sides of the stepladder are locked into place.  Ask someone to hold it for you, if possible.  Clearly mark and make sure that you can see:  Any grab bars or handrails.  First and last steps.  Where the edge of each step is.  Use tools that help you move around (mobility aids) if they are needed. These include:  Canes.  Walkers.  Scooters.  Crutches.  Turn on the lights when you go into a dark area. Replace any light bulbs as soon as they burn out.  Set up your furniture so you have a clear path. Avoid moving your furniture around.  If any of your floors are uneven, fix them.  If there are any pets around you, be aware of where they are.  Review your medicines with your doctor. Some medicines can make you feel dizzy. This can increase your chance of falling. Ask your doctor what other things that you can do to help prevent falls. This information is not intended to replace advice given to you by your health care provider. Make sure you discuss any questions you have with your health care provider. Document Released: 09/27/2009 Document Revised: 05/08/2016 Document Reviewed: 01/05/2015 Elsevier Interactive Patient Education  2017 [employer].

## 2020-11-28 NOTE — Patient Instructions (Signed)
Chronic Kidney Disease, Adult Chronic kidney disease (CKD) happens when the kidneys are damaged over a long period of time. The kidneys are two organs that help with:  Getting rid of waste and extra fluid from the blood.  Making hormones that maintain the amount of fluid in your tissues and blood vessels.  Making sure that the body has the right amount of fluids and chemicals. Most of the time, CKD does not go away, but it can usually be controlled. Steps must be taken to slow down the kidney damage or to stop it from getting worse. If this is not done, the kidneys may stop working. Follow these instructions at home: Medicines  Take over-the-counter and prescription medicines only as told by your doctor. You may need to change the amount of medicines you take.  Do not take any new medicines unless your doctor says it is okay. Many medicines can make your kidney damage worse.  Do not take any vitamin and supplements unless your doctor says it is okay. Many vitamins and supplements can make your kidney damage worse. General instructions  Follow a diet as told by your doctor. You may need to stay away from: ? Alcohol. ? Salty foods. ? Foods that are high in:  Potassium.  Calcium.  Protein.  Do not use any products that contain nicotine or tobacco, such as cigarettes and e-cigarettes. If you need help quitting, ask your doctor.  Keep track of your blood pressure at home. Tell your doctor about any changes.  If you have diabetes, keep track of your blood sugar as told by your doctor.  Try to stay at a healthy weight. If you need help, ask your doctor.  Exercise at least 30 minutes a day, 5 days a week.  Stay up-to-date with your shots (immunizations) as told by your doctor.  Keep all follow-up visits as told by your doctor. This is important. Contact a doctor if:  Your symptoms get worse.  You have new symptoms. Get help right away if:  You have symptoms of end-stage  kidney disease. These may include: ? Headaches. ? Numbness in your hands or feet. ? Easy bruising. ? Having hiccups often. ? Chest pain. ? Shortness of breath. ? Stopping of menstrual periods in women.  You have a fever.  You have very little pee (urine).  You have pain or bleeding when you pee. Summary  Chronic kidney disease (CKD) happens when the kidneys are damaged over a long period of time.  Most of the time, this condition does not go away, but it can usually be controlled. Steps must be taken to slow down the kidney damage or to stop it from getting worse.  Treatment may include a combination of medicines and lifestyle changes. This information is not intended to replace advice given to you by your health care provider. Make sure you discuss any questions you have with your health care provider. Document Revised: 11/13/2017 Document Reviewed: 01/05/2017 Elsevier Patient Education  2020 Elsevier Inc.  

## 2020-11-28 NOTE — Progress Notes (Signed)
I,Yamilka Roman [employer] as a Education administrator for [employer], FNP.,have documented all relevant documentation on the behalf of [name], FNP,as directed by  [name], FNP while in the presence of [name], [place]. This visit occurred during the SARS-CoV-2 public health emergency.  Safety protocols were in place, including screening questions prior to the visit, additional usage of staff PPE, and extensive cleaning of exam room while observing appropriate contact time as indicated for disinfecting solutions.  Subjective:     Patient ID: Nathan Morse , male    DOB: Jan 05, 1934 , 86 y.o.   MRN: [id number]   Chief Complaint  Patient presents with  . constipation  . f/u on kidney functions    HPI  Patient is here for a f/u on his kidney functions.    Wt Readings from Last 3 Encounters: 11/28/20 : 187 lb 2.7 oz (84.9 kg) 11/28/20 : 187 lb 3.2 oz (84.9 kg) 10/23/20 : 187 lb 6.4 oz (85 kg)  He admits to not staying well hydrated with water, he will drink about one a day. He mostly drinks tea.  He has not had anything to eat this morning. Last night he had fish, chicken, green beans and rice at [facility] -eat twice a day [place] in the morning.   Constipation This is a chronic problem. The current episode started more than 1 year ago. The problem has been gradually improving since onset. The patient is not on a high fiber diet. He does not exercise regularly. There has not been adequate water intake. Pertinent negatives include no abdominal pain. Treatments tried: when he takes two of the linzess 145 mcg it helps when needed.     Past Medical History:  Diagnosis Date  . Cancer ([place])   . Seborrheic keratosis, inflamed 10/16/2020   Skin Surgery      Family History  Adopted: Yes  Family history unknown: Yes     Current Outpatient Medications:  .  diclofenac Sodium (VOLTAREN) 1 % GEL, Apply 2 g topically 4 (four) times daily. (Patient not taking: Reported on 11/28/2020),  Disp: 100 g, Rfl: 2 .  linaclotide (LINZESS) 145 MCG CAPS capsule, Take 1 capsule (145 mcg total) by mouth daily before breakfast., Disp: 90 capsule, Rfl: 1 .  mirabegron ER (MYRBETRIQ) 50 MG TB24 tablet, Take 50 mg by mouth at bedtime. , Disp: , Rfl:  .  pneumococcal 13-valent conjugate vaccine (PREVNAR 13) SUSP injection, Inject 0.5 mLs into the muscle tomorrow at 10 am for 1 dose., Disp: 0.5 mL, Rfl: 0 .  sildenafil (REVATIO) 20 MG tablet, Take 1 tablet (20 mg total) by mouth as needed., Disp: 50 tablet, Rfl: 5   No Known Allergies   Review of Systems  Constitutional: Negative.   Respiratory: Negative.   Cardiovascular: Negative.  Negative for chest pain, palpitations and leg swelling.  Gastrointestinal: Positive for constipation. Negative for abdominal pain.  Psychiatric/Behavioral: Negative.      Today's Vitals   11/28/20 1022  BP: (!) 150/86  Pulse: 63  Temp: 97.9 F (36.6 C)  TempSrc: Oral  Weight: 187 lb 2.7 oz (84.9 kg)  Height: 5' 10"  (1.778 m)   Body mass index is 26.86 kg/m.   Objective:  Physical Exam Vitals reviewed.  Constitutional:      General: He is not in acute distress.    Appearance: Normal appearance.  Cardiovascular:     Rate and Rhythm: Normal rate and regular rhythm.     Pulses: Normal pulses.  Heart sounds: Normal heart sounds. No murmur heard.   Pulmonary:     Effort: Pulmonary effort is normal. No respiratory distress.     Breath sounds: Normal breath sounds.  Abdominal:     Comments: Mild swelling to right groin, has healing surgical wound with bonding glue  Genitourinary:    Comments: Right groin with healing surgical wound with reproducible swelling, Non tender Neurological:     General: No focal deficit present.     Mental Status: He is alert and oriented to person, place, and time.     Cranial Nerves: No cranial nerve deficit.  Psychiatric:        Mood and Affect: Mood normal.        Behavior: Behavior normal.        Thought  Content: Thought content normal.        Judgment: Judgment normal.         Assessment And Plan:     1. Elevated blood-pressure reading without diagnosis of hypertension  Slight elevation in blood pressure today  He is encouraged to increase his water intake as he is only drinking one bottle a day average  He is encouraged to limit intake of high salt foods  2. Stage 3a chronic kidney disease ([place])  Will recheck levels pending results will refer to nephrology   He is advised to increase water intake to at least 4 - 16 oz bottles a day - BMP8+EGFR  3. Erectile dysfunction following simple prostatectomy  Had been receiving sildenafil from his oncologist however is requesting a refill today - sildenafil (REVATIO) 20 MG tablet; Take 1 tablet (20 mg total) by mouth as needed.  Dispense: 50 tablet; Refill: 5   He also had his AW[facility] with [facility] today  Patient was given opportunity to ask questions. Patient verbalized understanding of the plan and was able to repeat key elements of the plan. All questions were answered to their satisfaction.   Teola Bradley, FNP, have reviewed all documentation for this visit. The documentation on 11/28/20 for the exam, diagnosis, procedures, and orders are all accurate and complete.  THE PATIENT IS ENCOURAGED TO PRACTICE SOCIAL DISTANCING DUE TO THE COVID-19 PANDEMIC.

## 2020-11-29 LAB — BMP8+EGFR
BUN/Creatinine Ratio: 8 — ABNORMAL LOW (ref 10–24)
BUN: 11 mg/dL (ref 8–27)
CO2: 26 mmol/L (ref 20–29)
Calcium: 8.8 mg/dL (ref 8.6–10.2)
Chloride: 102 mmol/L (ref 96–106)
Creatinine, Ser: 1.35 mg/dL — ABNORMAL HIGH (ref 0.76–1.27)
GFR calc Af Amer: 55 mL/min/{1.73_m2} — ABNORMAL LOW (ref >59)
GFR calc non Af Amer: 48 mL/min/{1.73_m2} — ABNORMAL LOW (ref >59)
Glucose: 94 mg/dL (ref 65–99)
Potassium: 4.4 mmol/L (ref 3.5–5.2)
Sodium: 141 mmol/L (ref 134–144)

## 2020-12-24 ENCOUNTER — Ambulatory Visit: Payer: Medicare Other | Admitting: Podiatry

## 2020-12-26 ENCOUNTER — Other Ambulatory Visit: Payer: Self-pay

## 2020-12-26 ENCOUNTER — Ambulatory Visit: Payer: Medicare HMO | Admitting: Podiatry

## 2020-12-26 ENCOUNTER — Encounter: Payer: Self-pay | Admitting: Podiatry

## 2020-12-26 DIAGNOSIS — M79674 Pain in right toe(s): Secondary | ICD-10-CM

## 2020-12-26 DIAGNOSIS — B351 Tinea unguium: Secondary | ICD-10-CM | POA: Diagnosis not present

## 2020-12-26 DIAGNOSIS — M2042 Other hammer toe(s) (acquired), left foot: Secondary | ICD-10-CM

## 2020-12-26 DIAGNOSIS — M2011 Hallux valgus (acquired), right foot: Secondary | ICD-10-CM

## 2020-12-26 DIAGNOSIS — M2012 Hallux valgus (acquired), left foot: Secondary | ICD-10-CM

## 2020-12-26 DIAGNOSIS — L84 Corns and callosities: Secondary | ICD-10-CM

## 2020-12-26 DIAGNOSIS — M79675 Pain in left toe(s): Secondary | ICD-10-CM | POA: Diagnosis not present

## 2020-12-26 DIAGNOSIS — N1831 Chronic kidney disease, stage 3a: Secondary | ICD-10-CM

## 2020-12-26 DIAGNOSIS — M2041 Other hammer toe(s) (acquired), right foot: Secondary | ICD-10-CM

## 2020-12-30 NOTE — Progress Notes (Signed)
Subjective:  Patient ID: Nathan Morse, male    DOB: 1934/11/06,  MRN: [id number]  87 y.o. male presents with at risk foot care. Patient has h/o CKD stage 3 and painful corn(s) b/l feet and painful thick toenails that are difficult to trim. Painful toenails interfere with ambulation. Aggravating factors include wearing enclosed shoe gear. Pain is relieved with periodic professional debridement. Painful corns are aggravated when weightbearing when wearing enclosed shoe gear. Pain is relieved with periodic professional debridement.   He voices no new pedal problems on today's visit.   PCP is [name], FNP and last visit was  10/23/2020.  Review of Systems: Negative except as noted in the HPI. Denies N/V/F/Ch. Past Medical History:  Diagnosis Date  . Cancer ([place])   . Seborrheic keratosis, inflamed 10/16/2020   Skin Surgery    Past Surgical History:  Procedure Laterality Date  . APPENDECTOMY    . INGUINAL HERNIA REPAIR Right 06/26/2020   Procedure: right open inguinal hernia repair with mesh;  Surgeon: Kinsinger, Arta Bruce, MD;  Location: [facility];  Service: General;  Laterality: Right;  . PENILE BIOPSY    . SHOULDER ARTHROSCOPY    . TONSILLECTOMY    . TONSILLECTOMY AND ADENOIDECTOMY     Patient Active Problem List   Diagnosis Date Noted  . Seborrheic keratosis, inflamed 10/16/2020  . Muscle spasm 05/19/2019  . Corns and callosities 05/19/2019  . Pain of toe of left foot 05/19/2019  . Urinary tract infection with hematuria 03/22/2019  . Vitamin D deficiency 03/22/2019  . Dysphonia 03/24/2017    Current Outpatient Medications:  .  diclofenac Sodium (VOLTAREN) 1 % GEL, Apply 2 g topically 4 (four) times daily. (Patient not taking: Reported on 11/28/2020), Disp: 100 g, Rfl: 2 .  linaclotide (LINZESS) 145 MCG CAPS capsule, Take 1 capsule (145 mcg total) by mouth daily before breakfast., Disp: 90 capsule, Rfl: 1 No Known Allergies Social History   Tobacco Use  Smoking Status Never  Smoker  Smokeless Tobacco Current User  . Types: Chew  Tobacco Comment   wants to quit, but hard    Objective:   Constitutional Pt is a pleasant 87 y.o. African American male, WD, WN in NAD.Marland Kitchen  Vascular Neurovascular status unchanged b/l lower extremities. Capillary refill time to digits immediate b/l. Palpable pedal pulses b/l LE. Pedal hair absent. Lower extremity skin temperature gradient within normal limits. No pain with calf compression b/l. No cyanosis or clubbing noted.  Neurologic Normal speech. Oriented to person, place, and time. Protective sensation intact 5/5 intact bilaterally with 10g monofilament b/l. Vibratory sensation intact b/l.  Dermatologic Pedal skin with normal turgor, texture and tone bilaterally. No open wounds bilaterally. No interdigital macerations bilaterally. Toenails 1-5 b/l elongated, discolored, dystrophic, thickened, crumbly with subungual debris and tenderness to dorsal palpation. Hyperkeratotic lesion(s) L 5th toe and R 5th toe PIPJ and Lister's corns b/l 5th digits.  No erythema, no edema, no drainage, no flocculence.  Orthopedic: Normal muscle strength 5/5 to all lower extremity muscle groups bilaterally. No pain crepitus or joint limitation noted with ROM b/l. Hammertoes noted to the L 5th toe and R 5th toe.   Radiographs: None Assessment:   1. Pain due to onychomycosis of toenails of both feet   2. Corns   3. Pain in toes of both feet   4. Acquired hammertoes of both feet   5. Hallux valgus, acquired, bilateral   6. Stage 3a chronic kidney disease ([place])    Plan:  Patient was  evaluated and treated and all questions answered.  Onychomycosis with pain -Nails palliatively debridement as below. -Educated on self-care  Procedure: Nail Debridement Rationale: Pain Type of Debridement: manual, sharp debridement. Instrumentation: Nail nipper, rotary burr. Number of Nails: 10  -Examined patient. -Toenails 1-5 b/l were debrided in length and girth  with sterile nail nippers and dremel without iatrogenic bleeding.  -Toenails 1-5 bilaterally debrided in length and girth without iatrogenic bleeding with sterile nail nipper and dremel.  -Corn(s) L 5th toe and R 5th toe PIPJ and Lister's corns b/l 5th digits pared utilizing sterile scalpel blade without complication or incident. Total number debrided=4. -Patient to report any pedal injuries to medical professional immediately. -Patient to continue soft, supportive shoe gear daily. -Patient/POA to call should there be question/concern in the interim.  Return in about 3 months (around 03/26/2021).  Marzetta Board, DPM

## 2021-03-01 ENCOUNTER — Telehealth: Payer: Self-pay | Admitting: Podiatry

## 2021-03-01 NOTE — Telephone Encounter (Signed)
Called pt lvm to reschedule 03/22/21 appt

## 2021-03-05 ENCOUNTER — Encounter: Payer: Self-pay | Admitting: Podiatry

## 2021-03-22 ENCOUNTER — Ambulatory Visit: Payer: Medicare HMO | Admitting: Podiatry

## 2021-04-02 ENCOUNTER — Ambulatory Visit: Payer: Medicare Other | Admitting: Nurse Practitioner

## 2021-04-23 ENCOUNTER — Ambulatory Visit (INDEPENDENT_AMBULATORY_CARE_PROVIDER_SITE_OTHER): Payer: Medicare HMO | Admitting: Nurse Practitioner

## 2021-04-23 ENCOUNTER — Other Ambulatory Visit: Payer: Self-pay

## 2021-04-23 ENCOUNTER — Encounter: Payer: Self-pay | Admitting: Nurse Practitioner

## 2021-04-23 VITALS — BP 122/70 | HR 70 | Temp 98.3°F | Ht 70.0 in | Wt 191.8 lb

## 2021-04-23 DIAGNOSIS — R3981 Functional urinary incontinence: Secondary | ICD-10-CM

## 2021-04-23 DIAGNOSIS — R143 Flatulence: Secondary | ICD-10-CM

## 2021-04-23 DIAGNOSIS — K5904 Chronic idiopathic constipation: Secondary | ICD-10-CM | POA: Diagnosis not present

## 2021-04-23 DIAGNOSIS — N1831 Chronic kidney disease, stage 3a: Secondary | ICD-10-CM | POA: Diagnosis not present

## 2021-04-23 DIAGNOSIS — M25512 Pain in left shoulder: Secondary | ICD-10-CM

## 2021-04-23 MED ORDER — LINACLOTIDE 72 MCG PO CAPS: 72.0000 ug | ORAL_CAPSULE | Freq: Every day | ORAL | 5 refills | Status: DC

## 2021-04-23 MED ORDER — LACTOBACILLUS PO TABS
1.0000 | ORAL_TABLET | Freq: Every day | ORAL | 1 refills | Status: DC
Start: 2021-04-23 — End: 2022-11-19

## 2021-04-23 MED ORDER — OXYBUTYNIN CHLORIDE ER 10 MG PO TB24: 10.0000 mg | ORAL_TABLET | Freq: Every day | ORAL | 1 refills | Status: DC

## 2021-04-23 NOTE — Progress Notes (Signed)
This visit occurred during the SARS-CoV-2 public health emergency.  Safety protocols were in place, including screening questions prior to the visit, additional usage of staff PPE, and extensive cleaning of exam room while observing appropriate contact time as indicated for disinfecting solutions.  Subjective:     Patient ID: Nathan Morse , male    DOB: 04-Mar-1934 , 87 y.o.   MRN: [id number]   Chief Complaint  Patient presents with  . Chronic Kidney Disease  . Back Pain    Patient stated he has pain in his left lower back for the past few months. The pain comes and goes     HPI  Patient presents today for a f/u on his chronic kidney disease. He reports having increased gas - he is taking linzess.   Back Pain This is a new problem. The current episode started 1 to 4 weeks ago. The problem occurs intermittently. Pain location: left mid back. Pertinent negatives include no abdominal pain, chest pain, headaches or leg pain. He has tried nothing for the symptoms. The treatment provided no relief.     Past Medical History:  Diagnosis Date  . Cancer ([place])   . Seborrheic keratosis, inflamed 10/16/2020   Skin Surgery      Family History  Adopted: Yes  Family history unknown: Yes     Current Outpatient Medications:  .  Lactobacillus TABS, Take 1 tablet by mouth daily., Disp: 90 tablet, Rfl: 1 .  linaclotide (LINZESS) 72 MCG capsule, Take 1 capsule (72 mcg total) by mouth daily before breakfast., Disp: 30 capsule, Rfl: 5 .  oxybutynin (DITROPAN XL) 10 MG 24 hr tablet, Take 1 tablet (10 mg total) by mouth at bedtime., Disp: 90 tablet, Rfl: 1   No Known Allergies   Review of Systems  Constitutional: Negative.   Respiratory: Negative.   Cardiovascular: Negative.  Negative for chest pain, palpitations and leg swelling.  Gastrointestinal: Negative for abdominal pain.  Musculoskeletal: Positive for back pain.       Left shoulder stiffness and discomfort  Neurological: Negative for  dizziness and headaches.  Psychiatric/Behavioral: Negative.      Today's Vitals   04/23/21 1109  BP: 122/70  Pulse: 70  Temp: 98.3 F (36.8 C)  TempSrc: Oral  Weight: 191 lb 12.8 oz (87 kg)  Height: _0  (1.778 m)  PainSc: 0-No pain   Body mass index is 27.52 kg/m.   Objective:  Physical Exam Vitals reviewed.  Constitutional:      General: He is not in acute distress.    Appearance: Normal appearance.  Cardiovascular:     Rate and Rhythm: Normal rate and regular rhythm.     Pulses: Normal pulses.     Heart sounds: Normal heart sounds. No murmur heard.   Pulmonary:     Effort: Pulmonary effort is normal. No respiratory distress.     Breath sounds: Normal breath sounds. No wheezing.  Abdominal:     Comments: Mild swelling to right groin, has healing surgical wound with bonding glue  Genitourinary:    Comments: Right groin with healing surgical wound with reproducible swelling, Non tender Musculoskeletal:        General: Tenderness (left shoulder pain, range of motion decreased) present.  Neurological:     General: No focal deficit present.     Mental Status: He is alert and oriented to person, place, and time.     Cranial Nerves: No cranial nerve deficit.  Psychiatric:        Mood  and Affect: Mood normal.        Behavior: Behavior normal.        Thought Content: Thought content normal.        Judgment: Judgment normal.         Assessment And Plan:     1. Flatulence  I have advised him to take a probiotic daily   He does eat out at [employer] and [employer] daily - Lactobacillus TABS; Take 1 tablet by mouth daily.  Dispense: 90 tablet; Refill: 1  2. Stage 3a chronic kidney disease ([place]) - CMP14+EGFR  3. Chronic idiopathic constipation  Continue with linzess - linaclotide (LINZESS) 72 MCG capsule; Take 1 capsule (72 mcg total) by mouth daily before breakfast.  Dispense: 30 capsule; Refill: 5  4. Functional urinary incontinence  Continue with  current medications - oxybutynin (DITROPAN XL) 10 MG 24 hr tablet; Take 1 tablet (10 mg total) by mouth at bedtime.  Dispense: 90 tablet; Refill: 1  5. Acute pain of left shoulder  Limited range of motion left shoulder  Will refer to PT to help with decreasing risk for frozen shoulder - Ambulatory referral to Physical Therapy     Patient was given opportunity to ask questions. Patient verbalized understanding of the plan and was able to repeat key elements of the plan. All questions were answered to their satisfaction.  [name], FNP   I, [name], FNP, have reviewed all documentation for this visit. The documentation on 05/01/21 for the exam, diagnosis, procedures, and orders are all accurate and complete.   IF YOU HAVE BEEN REFERRED TO A SPECIALIST, IT MAY TAKE 1-2 WEEKS TO SCHEDULE/PROCESS THE REFERRAL. IF YOU HAVE NOT HEARD FROM US/SPECIALIST IN TWO WEEKS, PLEASE GIVE [place] A CALL AT [phone].   THE PATIENT IS ENCOURAGED TO PRACTICE SOCIAL DISTANCING DUE TO THE COVID-19 PANDEMIC.

## 2021-04-24 LAB — CMP14+EGFR
ALT: 11 IU/L (ref 0–44)
AST: 22 IU/L (ref 0–40)
Albumin/Globulin Ratio: 1.9 (ref 1.2–2.2)
Albumin: 4.4 g/dL (ref 3.6–4.6)
Alkaline Phosphatase: 135 IU/L — ABNORMAL HIGH (ref 44–121)
BUN/Creatinine Ratio: 7 — ABNORMAL LOW (ref 10–24)
BUN: 11 mg/dL (ref 8–27)
Bilirubin Total: 0.6 mg/dL (ref 0.0–1.2)
CO2: 25 mmol/L (ref 20–29)
Calcium: 8.9 mg/dL (ref 8.6–10.2)
Chloride: 103 mmol/L (ref 96–106)
Creatinine, Ser: 1.51 mg/dL — ABNORMAL HIGH (ref 0.76–1.27)
Globulin, Total: 2.3 g/dL (ref 1.5–4.5)
Glucose: 94 mg/dL (ref 65–99)
Potassium: 4.2 mmol/L (ref 3.5–5.2)
Sodium: 143 mmol/L (ref 134–144)
Total Protein: 6.7 g/dL (ref 6.0–8.5)
eGFR: 45 mL/min/{1.73_m2} — ABNORMAL LOW (ref >59)

## 2021-05-01 ENCOUNTER — Ambulatory Visit (INDEPENDENT_AMBULATORY_CARE_PROVIDER_SITE_OTHER): Payer: Medicare HMO | Admitting: Podiatry

## 2021-05-01 ENCOUNTER — Encounter: Payer: Self-pay | Admitting: Podiatry

## 2021-05-01 ENCOUNTER — Other Ambulatory Visit: Payer: Self-pay

## 2021-05-01 DIAGNOSIS — B351 Tinea unguium: Secondary | ICD-10-CM | POA: Diagnosis not present

## 2021-05-01 DIAGNOSIS — M79675 Pain in left toe(s): Secondary | ICD-10-CM | POA: Diagnosis not present

## 2021-05-01 DIAGNOSIS — M79674 Pain in right toe(s): Secondary | ICD-10-CM

## 2021-05-01 DIAGNOSIS — L84 Corns and callosities: Secondary | ICD-10-CM

## 2021-05-05 NOTE — Progress Notes (Signed)
  Subjective:  Patient ID: Nathan Morse, male    DOB: 04-17-1934,  MRN: [id number]  Nathan Morse presents to clinic today for painful thick toenails that are difficult to trim. Pain interferes with ambulation. Aggravating factors include wearing enclosed shoe gear. Pain is relieved with periodic professional debridement.  No Known Allergies  Review of Systems: Negative except as noted in the HPI. Objective:   Constitutional Nathan Morse is a pleasant 87 y.o. African American male, in NAD. AAO x 3.   Vascular Capillary refill time to digits immediate b/l. Palpable pedal pulses b/l LE. Pedal hair absent. Lower extremity skin temperature gradient within normal limits. No cyanosis or clubbing noted.  Neurologic Normal speech. Oriented to person, place, and time. Protective sensation intact 5/5 intact bilaterally with 10g monofilament b/l. Vibratory sensation intact b/l.  Dermatologic Pedal skin with normal turgor, texture and tone bilaterally. No open wounds bilaterally. No interdigital macerations bilaterally. Toenails 1-5 b/l elongated, discolored, dystrophic, thickened, crumbly with subungual debris and tenderness to dorsal palpation. Hyperkeratotic lesion(s) L 5th toe and R 5th toe.  No erythema, no edema, no drainage, no fluctuance.  Orthopedic: Normal muscle strength 5/5 to all lower extremity muscle groups bilaterally. No pain crepitus or joint limitation noted with ROM b/l. Hammertoe(s) noted to the L 5th toe and R 5th toe.   Radiographs: None Assessment:   1. Pain due to onychomycosis of toenails of both feet   2. Corns   3. Pain in toes of both feet    Plan:  Patient was evaluated and treated and all questions answered.  Onychomycosis with pain -Nails palliatively debridement as below -Educated on self-care  Procedure: Nail Debridement Rationale: Pain Type of Debridement: manual, sharp debridement. Instrumentation: Nail nipper, rotary burr. Number of Nails: 10 -Examined  patient. -Patient to continue soft, supportive shoe gear daily. -Toenails 1-5 b/l were debrided in length and girth with sterile nail nippers and dremel without iatrogenic bleeding.  -Corn(s) L 5th toe and R 5th toe pared utilizing sterile scalpel blade without complication or incident. Total number debrided=2. -Patient to report any pedal injuries to medical professional immediately. -Patient/POA to call should there be question/concern in the interim.  Return in about 3 months (around 08/01/2021).  Marzetta Board, DPM

## 2021-05-16 DIAGNOSIS — M25512 Pain in left shoulder: Secondary | ICD-10-CM | POA: Diagnosis not present

## 2021-05-21 ENCOUNTER — Telehealth: Payer: Self-pay

## 2021-05-21 NOTE — Telephone Encounter (Signed)
-----   Message from [name], [place] sent at 05/09/2021  3:10 PM EDT ----- Have him to increase to 4-5 bottles of water daily  ----- Message ----- From: Michelle Nasuti, CMA Sent: 05/06/2021  12:39 PM EDT To: [name], FNP  The pt said he drinks 3 - 3.5 bottles of water per day.

## 2021-05-22 DIAGNOSIS — M25512 Pain in left shoulder: Secondary | ICD-10-CM | POA: Diagnosis not present

## 2021-05-31 ENCOUNTER — Telehealth: Payer: Self-pay

## 2021-05-31 NOTE — Telephone Encounter (Signed)
-----   Message from [name], [place] sent at 05/09/2021  3:10 PM EDT ----- Have him to increase to 4-5 bottles of water daily  ----- Message ----- From: Michelle Nasuti, CMA Sent: 05/06/2021  12:39 PM EDT To: [name], FNP  The pt said he drinks 3 - 3.5 bottles of water per day.

## 2021-05-31 NOTE — Telephone Encounter (Signed)
I left the pt a message to call the office back so that I can let him know how much water Nathan Flatten, FNP says that the pt needed

## 2021-06-05 ENCOUNTER — Ambulatory Visit (INDEPENDENT_AMBULATORY_CARE_PROVIDER_SITE_OTHER): Payer: Medicare HMO | Admitting: Nurse Practitioner

## 2021-06-05 ENCOUNTER — Other Ambulatory Visit: Payer: Self-pay

## 2021-06-05 ENCOUNTER — Encounter: Payer: Self-pay | Admitting: Nurse Practitioner

## 2021-06-05 ENCOUNTER — Ambulatory Visit: Payer: Medicare HMO | Admitting: Nurse Practitioner

## 2021-06-05 VITALS — BP 116/80 | HR 71 | Temp 98.1°F | Ht 70.0 in | Wt 186.6 lb

## 2021-06-05 DIAGNOSIS — Z23 Encounter for immunization: Secondary | ICD-10-CM

## 2021-06-05 DIAGNOSIS — N5234 Erectile dysfunction following simple prostatectomy: Secondary | ICD-10-CM | POA: Diagnosis not present

## 2021-06-05 DIAGNOSIS — Z8546 Personal history of malignant neoplasm of prostate: Secondary | ICD-10-CM | POA: Diagnosis not present

## 2021-06-05 DIAGNOSIS — M25512 Pain in left shoulder: Secondary | ICD-10-CM | POA: Diagnosis not present

## 2021-06-05 DIAGNOSIS — R143 Flatulence: Secondary | ICD-10-CM | POA: Diagnosis not present

## 2021-06-05 DIAGNOSIS — K5904 Chronic idiopathic constipation: Secondary | ICD-10-CM

## 2021-06-05 MED ORDER — PREVNAR 20 0.5 ML IM SUSY: 0.5000 mL | PREFILLED_SYRINGE | INTRAMUSCULAR | 0 refills | Status: AC

## 2021-06-05 NOTE — Progress Notes (Signed)
I,Yamilka [employer] as a Education administrator for [employer], FNP.,have documented all relevant documentation on the behalf of [name], FNP,as directed by  [name], FNP while in the presence of [name], [place].  This visit occurred during the SARS-CoV-2 public health emergency.  Safety protocols were in place, including screening questions prior to the visit, additional usage of staff PPE, and extensive cleaning of exam room while observing appropriate contact time as indicated for disinfecting solutions.  Subjective:     Patient ID: Nathan Morse , male    DOB: 1934-08-31 , 87 y.o.   MRN: [id number]   Chief Complaint  Patient presents with   Gas    Patient stated he is tolerating the med well.     HPI  Patient presents today for a f/u on his flatulence. He stated he is tolerating the medication well.  He is taking better bowel movements as well.  He is going to PT for his left shoulder stiffness.    Past Medical History:  Diagnosis Date   Cancer ([place])    Seborrheic keratosis, inflamed 10/16/2020   Skin Surgery      Family History  Adopted: Yes  Family history unknown: Yes     Current Outpatient Medications:    Lactobacillus TABS, Take 1 tablet by mouth daily., Disp: 90 tablet, Rfl: 1   linaclotide (LINZESS) 72 MCG capsule, Take 1 capsule (72 mcg total) by mouth daily before breakfast., Disp: 30 capsule, Rfl: 5   oxybutynin (DITROPAN XL) 10 MG 24 hr tablet, Take 1 tablet (10 mg total) by mouth at bedtime., Disp: 90 tablet, Rfl: 1   No Known Allergies   Review of Systems  Constitutional: Negative.   Respiratory: Negative.    Cardiovascular:  Negative for palpitations and leg swelling.  Psychiatric/Behavioral: Negative.      Today's Vitals   06/05/21 1414  BP: 116/80  Pulse: 71  Temp: 98.1 F (36.7 C)  Weight: 186 lb 9.6 oz (84.6 kg)  Height: 5\' 10"  (1.778 m)  PainSc: 0-No pain   Body mass index is 26.77 kg/m.   Objective:  Physical Exam Vitals  reviewed.  Constitutional:      General: He is not in acute distress.    Appearance: Normal appearance.  Cardiovascular:     Rate and Rhythm: Normal rate and regular rhythm.     Pulses: Normal pulses.     Heart sounds: Normal heart sounds. No murmur heard. Pulmonary:     Effort: Pulmonary effort is normal. No respiratory distress.     Breath sounds: Normal breath sounds. No wheezing.  Neurological:     General: No focal deficit present.     Mental Status: He is alert and oriented to person, place, and time.     Cranial Nerves: No cranial nerve deficit.  Psychiatric:        Mood and Affect: Mood normal.        Behavior: Behavior normal.        Thought Content: Thought content normal.        Judgment: Judgment normal.        Assessment And Plan:     1. Flatulence Comments: Improved with probiotic   2. Chronic idiopathic constipation Comments: doing better since starting probiotic  3. History of prostate cancer  4. Erectile dysfunction following simple prostatectomy Comments: he will follow up with his Oncologist/urologist to see if there is a medication to help him since he has a penile pump  5. Encounter for  immunization - Pneumococcal conjugate vaccine 20-valent (Prevnar 20)    Patient was given opportunity to ask questions. Patient verbalized understanding of the plan and was able to repeat key elements of the plan. All questions were answered to their satisfaction.  [name], FNP   I, [name], FNP, have reviewed all documentation for this visit. The documentation on 06/05/21 for the exam, diagnosis, procedures, and orders are all accurate and complete.   IF YOU HAVE BEEN REFERRED TO A SPECIALIST, IT MAY TAKE 1-2 WEEKS TO SCHEDULE/PROCESS THE REFERRAL. IF YOU HAVE NOT HEARD FROM US/SPECIALIST IN TWO WEEKS, PLEASE GIVE [place] A CALL AT [phone].   THE PATIENT IS ENCOURAGED TO PRACTICE SOCIAL DISTANCING DUE TO THE COVID-19 PANDEMIC.

## 2021-06-10 ENCOUNTER — Encounter: Payer: Self-pay | Admitting: Nurse Practitioner

## 2021-06-12 DIAGNOSIS — M25512 Pain in left shoulder: Secondary | ICD-10-CM | POA: Diagnosis not present

## 2021-06-18 ENCOUNTER — Ambulatory Visit
Admission: RE | Admit: 2021-06-18 | Discharge: 2021-06-18 | Disposition: A | Discharge status: Home / Self Care | Payer: Medicare HMO | Source: Ambulatory Visit | Attending: Nurse Practitioner | Admitting: Nurse Practitioner

## 2021-06-18 ENCOUNTER — Ambulatory Visit (INDEPENDENT_AMBULATORY_CARE_PROVIDER_SITE_OTHER): Payer: Medicare HMO | Admitting: Nurse Practitioner

## 2021-06-18 ENCOUNTER — Encounter: Payer: Self-pay | Admitting: Nurse Practitioner

## 2021-06-18 ENCOUNTER — Other Ambulatory Visit: Payer: Self-pay

## 2021-06-18 VITALS — BP 114/60 | HR 79 | Temp 98.4°F | Ht 70.0 in | Wt 179.0 lb

## 2021-06-18 DIAGNOSIS — M1611 Unilateral primary osteoarthritis, right hip: Secondary | ICD-10-CM | POA: Diagnosis not present

## 2021-06-18 DIAGNOSIS — Z8546 Personal history of malignant neoplasm of prostate: Secondary | ICD-10-CM

## 2021-06-18 DIAGNOSIS — W19XXXA Unspecified fall, initial encounter: Secondary | ICD-10-CM | POA: Diagnosis not present

## 2021-06-18 DIAGNOSIS — M545 Low back pain, unspecified: Secondary | ICD-10-CM | POA: Diagnosis not present

## 2021-06-18 DIAGNOSIS — M25559 Pain in unspecified hip: Secondary | ICD-10-CM

## 2021-06-18 MED ORDER — DICLOFENAC SODIUM 1 % EX GEL: 2.0000 g | Freq: Four times a day (QID) | CUTANEOUS | 2 refills | Status: DC

## 2021-06-18 NOTE — Progress Notes (Signed)
I,Yamilka [employer] as a Education administrator for [employer], FNP.,have documented all relevant documentation on the behalf of [name], FNP,as directed by  [name], FNP while in the presence of [name], [place].  This visit occurred during the SARS-CoV-2 public health emergency.  Safety protocols were in place, including screening questions prior to the visit, additional usage of staff PPE, and extensive cleaning of exam room while observing appropriate contact time as indicated for disinfecting solutions.  Subjective:     Patient ID: Nathan Morse , male    DOB: Aug 25, 1934 , 87 y.o.   MRN: [id number]   Chief Complaint  Patient presents with   Fall    HPI  Patient presents today for a fall. He stated he fell yesterday while trying to transfer from the chair to the couch. Patient stated his back and right leg is hurting. He feels like his right leg doesn't want to move. He would like to have xrays done.  Reports he is having pain to right low back.   He also reports his clothes are too big.  Wt Readings from Last 3 Encounters: 06/18/21 : 179 lb (81.2 kg) 06/05/21 : 186 lb 9.6 oz (84.6 kg) 04/23/21 : 191 lb 12.8 oz (87 kg)    Fall Incident onset: yesterday. Fall occurred: patient was trying to transfer from chair to couch. There was no blood loss. The point of impact was the right hip. The pain is present in the right upper leg and right lower leg. The pain is at a severity of 5/10. The pain is moderate. The symptoms are aggravated by movement. Pertinent negatives include no abdominal pain. He has tried nothing for the symptoms.    Past Medical History:  Diagnosis Date   Cancer ([place])    Seborrheic keratosis, inflamed 10/16/2020   Skin Surgery      Family History  Adopted: Yes  Family history unknown: Yes     Current Outpatient Medications:    diclofenac Sodium (VOLTAREN) 1 % GEL, Apply 2 g topically 4 (four) times daily., Disp: 100 g, Rfl: 2   Lactobacillus TABS, Take  1 tablet by mouth daily., Disp: 90 tablet, Rfl: 1   linaclotide (LINZESS) 72 MCG capsule, Take 1 capsule (72 mcg total) by mouth daily before breakfast., Disp: 30 capsule, Rfl: 5   oxybutynin (DITROPAN XL) 10 MG 24 hr tablet, Take 1 tablet (10 mg total) by mouth at bedtime., Disp: 90 tablet, Rfl: 1   No Known Allergies   Review of Systems  Constitutional:  Positive for appetite change.  Respiratory: Negative.    Cardiovascular: Negative.  Negative for chest pain, palpitations and leg swelling.  Gastrointestinal:  Negative for abdominal pain and constipation.  Musculoskeletal:  Negative for back pain.       Right hip pain  Psychiatric/Behavioral: Negative.      Today's Vitals   06/18/21 1116  BP: 114/60  Pulse: 79  Temp: 98.4 F (36.9 C)  Weight: 179 lb (81.2 kg)  Height: 5\' 10"  (1.778 m)  PainSc: 5   PainLoc: Leg   Body mass index is 25.68 kg/m.   Objective:  Physical Exam Vitals reviewed.  Constitutional:      General: He is not in acute distress.    Appearance: Normal appearance.  Cardiovascular:     Rate and Rhythm: Normal rate and regular rhythm.     Pulses: Normal pulses.     Heart sounds: Normal heart sounds. No murmur heard. Pulmonary:  Effort: Pulmonary effort is normal. No respiratory distress.     Breath sounds: Normal breath sounds. No wheezing.  Musculoskeletal:        General: Tenderness (right hip pain and low back) present. Normal range of motion.     Right lower leg: No edema.     Left lower leg: No edema.     Comments: Using cane  Skin:    Capillary Refill: Capillary refill takes less than 2 seconds.  Neurological:     General: No focal deficit present.     Mental Status: He is alert and oriented to person, place, and time.     Cranial Nerves: No cranial nerve deficit.  Psychiatric:        Mood and Affect: Mood normal.        Behavior: Behavior normal.        Thought Content: Thought content normal.        Judgment: Judgment normal.         Assessment And Plan:     1. Hip pain Good range of motion  Tenderness to palpation  Will obtain hip xray to check for structural damage  2. Acute midline low back pain without sciatica Negative radiculopathy Mild tenderness on palpation Will check lumbar spine xray  3. Fall, initial encounter Fall while transferring from chair to the bed - AMB Referral to [facility] Complete; Future - DG Hip Unilat W OR W/O Pelvis Min 4 Views Right; Future - diclofenac Sodium (VOLTAREN) 1 % GEL; Apply 2 g topically 4 (four) times daily.  Dispense: 100 g; Refill: 2  4. History of prostate cancer - PSA    Patient was given opportunity to ask questions. Patient verbalized understanding of the plan and was able to repeat key elements of the plan. All questions were answered to their satisfaction.  [name], FNP   I, [name], FNP, have reviewed all documentation for this visit. The documentation on 06/18/21 for the exam, diagnosis, procedures, and orders are all accurate and complete.   IF YOU HAVE BEEN REFERRED TO A SPECIALIST, IT MAY TAKE 1-2 WEEKS TO SCHEDULE/PROCESS THE REFERRAL. IF YOU HAVE NOT HEARD FROM US/SPECIALIST IN TWO WEEKS, PLEASE GIVE [place] A CALL AT [phone].   THE PATIENT IS ENCOURAGED TO PRACTICE SOCIAL DISTANCING DUE TO THE COVID-19 PANDEMIC.

## 2021-06-19 ENCOUNTER — Telehealth: Payer: Self-pay | Admitting: *Deleted

## 2021-06-19 LAB — PSA: Prostate Specific Ag, Serum: 0.2 ng/mL (ref 0.0–4.0)

## 2021-06-19 NOTE — Chronic Care Management (AMB) (Signed)
  Chronic Care Management   Note  06/19/2021 Name: Kinney Sackmann MRN: [id number] DOB: [birth year]  Karl Erway is a 87 y.o. year old male who is a primary care patient of [name], [place]. I reached out to Arie Sabina by phone today in response to a referral sent by Mr. Ellene Route PCP, [name], FNP      Mr. Bastidas was given information about Chronic Care Management services today including:  CCM service includes personalized support from designated clinical staff supervised by his physician, including individualized plan of care and coordination with other care providers 24/7 contact phone numbers for assistance for urgent and routine care needs. Service will only be billed when office clinical staff spend 20 minutes or more in a month to coordinate care. Only one practitioner may furnish and bill the service in a calendar month. The patient may stop CCM services at any time (effective at the end of the month) by phone call to the office staff. The patient will be responsible for cost sharing (co-pay) of up to 20% of the service fee (after annual deductible is met).  Patient agreed to services and verbal consent obtained.   Follow up plan: Telephone appointment with care management team member scheduled for:07/09/2021  [place] [facility]

## 2021-06-27 ENCOUNTER — Telehealth: Payer: Self-pay | Admitting: *Deleted

## 2021-06-27 NOTE — Telephone Encounter (Signed)
   Telephone encounter was:  Successful.  06/27/2021 Name: Nathan Morse MRN: [id number] DOB: [birth year]  Nathan Morse is a 87 y.o. year old male who is a primary care patient of [name], [name] . The community resource team was consulted for assistance with Patient and friend took information to find out about lifeline discount through insurance some question as to what insurance he has chart says humana, media has [facility] but provided all information to follow up  Care guide performed the following interventions: Patient provided with information about care guide support team and interviewed to confirm resource needs.  Follow Up Plan:  No further follow up planned at this time. The patient has been provided with needed resources.  [place], Care Management  [phone] [address] , [place] [address] Email : [email or web address]

## 2021-07-04 ENCOUNTER — Encounter: Payer: Self-pay | Admitting: Nurse Practitioner

## 2021-07-09 ENCOUNTER — Telehealth: Payer: Medicare HMO

## 2021-07-09 ENCOUNTER — Telehealth: Payer: Self-pay

## 2021-07-09 NOTE — Telephone Encounter (Signed)
  Care Management   Follow Up Note   07/09/2021 Name: Nathan Morse MRN: [id number] DOB: 1934-04-29   Referred by: [name], FNP Reason for referral : Chronic Care Management (Unsuccessful call)   An unsuccessful telephone outreach was attempted today. The patient was referred to the case management team for assistance with care management and care coordination. SW left a HIPAA compliant voice message requesting a return call.  Follow Up Plan: The care management team will reach out to the patient again over the next 30 days.   Daneen Schick, BSW, CDP Social Worker, Certified Dementia Practitioner [place] [facility] [place] [facility] [phone]

## 2021-07-14 ENCOUNTER — Emergency Department (HOSPITAL_BASED_OUTPATIENT_CLINIC_OR_DEPARTMENT_OTHER)
Admission: EM | Admit: 2021-07-14 | Discharge: 2021-07-14 | Disposition: A | Discharge status: Home / Self Care | Payer: Medicare HMO | Attending: Emergency Medicine | Admitting: Emergency Medicine

## 2021-07-14 ENCOUNTER — Emergency Department (HOSPITAL_BASED_OUTPATIENT_CLINIC_OR_DEPARTMENT_OTHER): Payer: Medicare HMO

## 2021-07-14 DIAGNOSIS — I10 Essential (primary) hypertension: Secondary | ICD-10-CM | POA: Diagnosis not present

## 2021-07-14 DIAGNOSIS — Z859 Personal history of malignant neoplasm, unspecified: Secondary | ICD-10-CM | POA: Insufficient documentation

## 2021-07-14 DIAGNOSIS — F17220 Nicotine dependence, chewing tobacco, uncomplicated: Secondary | ICD-10-CM | POA: Diagnosis not present

## 2021-07-14 DIAGNOSIS — K5900 Constipation, unspecified: Secondary | ICD-10-CM | POA: Diagnosis not present

## 2021-07-14 LAB — CBC WITH DIFFERENTIAL/PLATELET
Abs Immature Granulocytes: 0.02 10*3/uL (ref 0.00–0.07)
Basophils Absolute: 0 10*3/uL (ref 0.0–0.1)
Basophils Relative: 0 %
Eosinophils Absolute: 0 10*3/uL (ref 0.0–0.5)
Eosinophils Relative: 0 %
HCT: 42.7 % (ref 39.0–52.0)
Hemoglobin: 13.4 g/dL (ref 13.0–17.0)
Immature Granulocytes: 0 %
Lymphocytes Relative: 14 %
Lymphs Abs: 1.1 10*3/uL (ref 0.7–4.0)
MCH: 28.3 pg (ref 26.0–34.0)
MCHC: 31.4 g/dL (ref 30.0–36.0)
MCV: 90.3 fL (ref 80.0–100.0)
Monocytes Absolute: 0.6 10*3/uL (ref 0.1–1.0)
Monocytes Relative: 7 %
Neutro Abs: 6.4 10*3/uL (ref 1.7–7.7)
Neutrophils Relative %: 79 %
Platelets: 155 10*3/uL (ref 150–400)
RBC: 4.73 MIL/uL (ref 4.22–5.81)
RDW: 12.6 % (ref 11.5–15.5)
WBC: 8.2 10*3/uL (ref 4.0–10.5)
nRBC: 0 % (ref 0.0–0.2)

## 2021-07-14 LAB — COMPREHENSIVE METABOLIC PANEL
ALT: 8 U/L (ref 0–44)
AST: 16 U/L (ref 15–41)
Albumin: 3.8 g/dL (ref 3.5–5.0)
Alkaline Phosphatase: 86 U/L (ref 38–126)
Anion gap: 5 (ref 5–15)
BUN: 17 mg/dL (ref 8–23)
CO2: 27 mmol/L (ref 22–32)
Calcium: 8.9 mg/dL (ref 8.9–10.3)
Chloride: 106 mmol/L (ref 98–111)
Creatinine, Ser: 1.49 mg/dL — ABNORMAL HIGH (ref 0.61–1.24)
GFR, Estimated: 46 mL/min — ABNORMAL LOW (ref >60)
Glucose, Bld: 104 mg/dL — ABNORMAL HIGH (ref 70–99)
Potassium: 4.5 mmol/L (ref 3.5–5.1)
Sodium: 138 mmol/L (ref 135–145)
Total Bilirubin: 0.8 mg/dL (ref 0.3–1.2)
Total Protein: 6.5 g/dL (ref 6.5–8.1)

## 2021-07-14 MED ORDER — POLYETHYLENE GLYCOL 3350 17 GM/SCOOP PO POWD: ORAL | 0 refills | Status: DC

## 2021-07-14 NOTE — ED Provider Notes (Signed)
[facility] EMERGENCY DEPT Provider Note  CSN: [id number] Arrival date & time: 07/14/21 0254  Chief Complaint(s) Constipation  HPI Nathan Morse is a 87 y.o. male    Constipation Severity:  Moderate Time since last bowel movement:  6 days Timing:  Constant Progression:  Unchanged Chronicity:  New Context: not dehydration and not dietary changes   Stool description:  Malodorous and small Relieved by:  None tried Worsened by:  Nothing Ineffective treatments:  None tried Associated symptoms: flatus   Associated symptoms: no abdominal pain, no anorexia, no back pain, no diarrhea, no fever, no hematochezia, no nausea and no vomiting    Past Medical History Past Medical History:  Diagnosis Date   Cancer ([place])    Seborrheic keratosis, inflamed 10/16/2020   Skin Surgery    Patient Active Problem List   Diagnosis Date Noted   Seborrheic keratosis, inflamed 10/16/2020   Muscle spasm 05/19/2019   Corns and callosities 05/19/2019   Pain of toe of left foot 05/19/2019   Urinary tract infection with hematuria 03/22/2019   Vitamin D deficiency 03/22/2019   Dysphonia 03/24/2017   Home Medication(s) Prior to Admission medications   Medication Sig Start Date End Date Taking? Authorizing Provider  polyethylene glycol powder (MIRALAX) 17 GM/SCOOP powder Please take 6 capfuls of MiraLAX in a 32 oz bottle of Gatorade over 2-4 hour period. The following day take 3 capfuls. On day 3 start taking 1 capful 3 times a day. Slowly cut back as needed until you have normal bowel movements. 07/14/21  Yes Joan Avetisyan, Grayce Sessions, MD  diclofenac Sodium (VOLTAREN) 1 % GEL Apply 2 g topically 4 (four) times daily. 06/18/21   [name], FNP  Lactobacillus TABS Take 1 tablet by mouth daily. 04/23/21   [name], FNP  linaclotide Rolan Lipa) 72 MCG capsule Take 1 capsule (72 mcg total) by mouth daily before breakfast. 04/23/21   [name], FNP  oxybutynin (DITROPAN XL) 10 MG 24 hr tablet  Take 1 tablet (10 mg total) by mouth at bedtime. 04/23/21   [name], FNP                                                                                                                                    Past Surgical History Past Surgical History:  Procedure Laterality Date   APPENDECTOMY     INGUINAL HERNIA REPAIR Right 06/26/2020   Procedure: right open inguinal hernia repair with mesh;  Surgeon: Kinsinger, Arta Bruce, MD;  Location: [facility] ORS;  Service: General;  Laterality: Right;   PENILE BIOPSY     SHOULDER ARTHROSCOPY     TONSILLECTOMY     TONSILLECTOMY AND ADENOIDECTOMY     Family History Family History  Adopted: Yes  Family history unknown: Yes    Social History Social History   Tobacco Use   Smoking status: Never   Smokeless tobacco: Current    Types: Chew   Tobacco  comments:    wants to quit, but hard  Vaping Use   Vaping Use: Never used  Substance Use Topics   Alcohol use: No    Alcohol/week: 0.0 standard drinks   Drug use: No   Allergies Patient has no known allergies.  Review of Systems Review of Systems  Constitutional:  Negative for fever.  Gastrointestinal:  Positive for constipation and flatus. Negative for abdominal pain, anorexia, diarrhea, hematochezia, nausea and vomiting.  Musculoskeletal:  Negative for back pain.  All other systems are reviewed and are negative for acute change except as noted in the HPI  Physical Exam Vital Signs  I have reviewed the triage vital signs BP (!) 158/95 (BP Location: Left Arm)   Pulse 76   Temp 98.4 F (36.9 C) (Oral)   Resp 18   Ht '6\' 1"'$  (1.854 m)   Wt 80 kg   SpO2 99%   BMI 23.26 kg/m   Physical Exam Vitals reviewed. Exam conducted with a chaperone present.  Constitutional:      General: He is not in acute distress.    Appearance: He is well-developed. He is not diaphoretic.  HENT:     Head: Normocephalic and atraumatic.     Right Ear: External ear normal.     Left Ear: External ear normal.      Nose: Nose normal.     Mouth/Throat:     Mouth: Mucous membranes are moist.  Eyes:     General: No scleral icterus.    Conjunctiva/sclera: Conjunctivae normal.  Neck:     Trachea: Phonation normal.  Cardiovascular:     Rate and Rhythm: Normal rate and regular rhythm.  Pulmonary:     Effort: Pulmonary effort is normal. No respiratory distress.     Breath sounds: No stridor.  Abdominal:     General: There is no distension.     Tenderness: There is no abdominal tenderness.  Genitourinary:    Rectum: No tenderness or external hemorrhoid.     Comments: Light brown liquid stool on the perineum. Clay-like consistency stool in the rectal vault. No blood. Musculoskeletal:        General: Normal range of motion.     Cervical back: Normal range of motion.  Neurological:     Mental Status: He is alert and oriented to person, place, and time.  Psychiatric:        Behavior: Behavior normal.    ED Results and Treatments Labs (all labs ordered are listed, but only abnormal results are displayed) Labs Reviewed  COMPREHENSIVE METABOLIC PANEL - Abnormal; Notable for the following components:      Result Value   Glucose, Bld 104 (*)    Creatinine, Ser 1.49 (*)    GFR, Estimated 46 (*)    All other components within normal limits  CBC WITH DIFFERENTIAL/PLATELET  EKG  EKG Interpretation  Date/Time:    Ventricular Rate:    PR Interval:    QRS Duration:   QT Interval:    QTC Calculation:   R Axis:     Text Interpretation:         Radiology DG ABD ACUTE 2+V W 1V CHEST  Result Date: 07/14/2021 CLINICAL DATA:  Constipation EXAM: DG ABDOMEN ACUTE WITH 1 VIEW CHEST COMPARISON:  None. FINDINGS: There is no evidence of dilated bowel loops or free intraperitoneal air. Small stool within the distal colon and rectal vault. No radiopaque calculi or other significant  radiographic abnormality is seen. Heart size and mediastinal contours are within normal limits. Both lungs are clear. Penile prosthesis noted. IMPRESSION: Negative abdominal radiographs. Small stool within the distal colon and rectal vault. No acute cardiopulmonary disease. Electronically Signed   By: Fidela Salisbury MD   On: 07/14/2021 03:43    Pertinent labs & imaging results that were available during my care of the patient were reviewed by me and considered in my medical decision making (see chart for details).  Medications Ordered in ED Medications - No data to display                                                                                                                                  Procedures Fecal disimpaction  Date/Time: 07/14/2021 4:03 AM Performed by: Fatima Blank, MD Authorized by: Fatima Blank, MD  Consent: Verbal consent obtained. Consent given by: patient Patient understanding: patient states understanding of the procedure being performed Patient identity confirmed: verbally with patient Local anesthesia used: no  Anesthesia: Local anesthesia used: no  Sedation: Patient sedated: no  Patient tolerance: patient tolerated the procedure well with no immediate complications    (including critical care time)  Medical Decision Making / ED Course I have reviewed the nursing notes for this encounter and the patient's prior records (if available in EHR or on provided paperwork).   Chadwick Kostrzewski was evaluated in Emergency Department on 07/14/2021 for the symptoms described in the history of present illness. He was evaluated in the context of the global COVID-19 pandemic, which necessitated consideration that the patient might be at risk for infection with the SARS-CoV-2 virus that causes COVID-19. Institutional protocols and algorithms that pertain to the evaluation of patients at risk for COVID-19 are in a state of rapid change based on information  released by regulatory bodies including the CDC and federal and state organizations. These policies and algorithms were followed during the patient's care in the ED.  Constipation. Plain film w/o evidence of obstruction. Labs reassuring. Manually disimpacted. Rx miralax.      Final Clinical Impression(s) / ED Diagnoses Final diagnoses:  Constipation   The patient appears reasonably screened and/or stabilized for discharge and I doubt any other medical condition or other [facility] requiring further screening, evaluation, or treatment in the ED at this time prior  to discharge. Safe for discharge with strict return precautions.  Disposition: Discharge  Condition: Good  I have discussed the results, Dx and Tx plan with the patient/family who expressed understanding and agree(s) with the plan. Discharge instructions discussed at length. The patient/family was given strict return precautions who verbalized understanding of the instructions. No further questions at time of discharge.    ED Discharge Orders          Ordered    polyethylene glycol powder (MIRALAX) 17 GM/SCOOP powder        07/14/21 0405            Follow Up: [name], [address] [place] [address] [phone]  Call  as needed      This chart was dictated using voice recognition software.  Despite best efforts to proofread,  errors can occur which can change the documentation meaning.    Fatima Blank, MD 07/14/21 [phone]

## 2021-07-14 NOTE — ED Triage Notes (Signed)
PT to ED from home with c/o constipation x6 days. Pt states he hasn't had a formed BM since last Sunday and has only to get very small stools for the past 2 days. Pt states "I do not eat like I am supposed to but this is not normal for me".

## 2021-07-15 ENCOUNTER — Telehealth: Payer: Self-pay

## 2021-07-15 NOTE — Telephone Encounter (Signed)
Transition Care Management Follow-up Telephone Call Date of discharge and from where: 07/14/2021 How have you been since you were released from the hospital? Pt states he feels slightly better.  Any questions or concerns? No  Items Reviewed: Did the pt receive and understand the discharge instructions provided? Yes  Medications obtained and verified? Yes  Other? No  Any new allergies since your discharge? Yes  Dietary orders reviewed? No Do you have support at home? Yes   Home Care and Equipment/Supplies: Were home health services ordered? not applicable If so, what is the name of the agency? N/a   Has the agency set up a time to come to the patient's home? not applicable Were any new equipment or medical supplies ordered?  No What is the name of the medical supply agency? N/a  Were you able to get the supplies/equipment? not applicable Do you have any questions related to the use of the equipment or supplies? No  Functional Questionnaire: (I = Independent and D = Dependent) ADLs: i  Bathing/Dressing- i  Meal Prep- i  Eating- i  Maintaining continence- i  Transferring/Ambulation- i  Managing Meds- i  Follow up appointments reviewed:  PCP Hospital f/u appt confirmed? Yes  Scheduled to see [name]  on 07/17/2021 @ [facility]. [facility] f/u appt confirmed? No  Scheduled to see N/A  on N/A  @ N/A . Are transportation arrangements needed? No  If their condition worsens, is the pt aware to call PCP or go to the Emergency Dept.? Yes Was the patient provided with contact information for the PCP's office or ED? Yes Was to pt encouraged to call back with questions or concerns? Yes

## 2021-07-17 ENCOUNTER — Ambulatory Visit (INDEPENDENT_AMBULATORY_CARE_PROVIDER_SITE_OTHER): Payer: Medicare HMO | Admitting: Nurse Practitioner

## 2021-07-17 ENCOUNTER — Other Ambulatory Visit: Payer: Self-pay

## 2021-07-17 ENCOUNTER — Encounter: Payer: Self-pay | Admitting: Nurse Practitioner

## 2021-07-17 VITALS — BP 130/72 | HR 70 | Temp 98.6°F | Ht 70.6 in | Wt 175.4 lb

## 2021-07-17 DIAGNOSIS — R630 Anorexia: Secondary | ICD-10-CM

## 2021-07-17 DIAGNOSIS — K121 Other forms of stomatitis: Secondary | ICD-10-CM | POA: Diagnosis not present

## 2021-07-17 DIAGNOSIS — K5904 Chronic idiopathic constipation: Secondary | ICD-10-CM | POA: Diagnosis not present

## 2021-07-17 NOTE — Progress Notes (Signed)
I,Tianna Badgett,acting as a Education administrator for [employer], FNP.,have documented all relevant documentation on the behalf of [name], FNP,as directed by  [name], FNP while in the presence of [name], [place].  This visit occurred during the SARS-CoV-2 public health emergency.  Safety protocols were in place, including screening questions prior to the visit, additional usage of staff PPE, and extensive cleaning of exam room while observing appropriate contact time as indicated for disinfecting solutions.  Subjective:     Patient ID: Nathan Morse , male    DOB: Jul 10, 1934 , 87 y.o.   MRN: [id number]   Chief Complaint  Patient presents with   Follow-up    HPI  Patient is here for ED Follow up for constipation. He states that he has been going since taking the miralax mixed with Gatorade. He had spent about 1.5 hours on the toilet. He had not had a bowel movement for 4 days prior to going to the ER. He has been having bowel movements since being home until yesterday.  He does not eat many vegetables, eats at [employer] in am and [employer] evening. He drinks 3-4 bottles of water a day.  He is unsure if he is taking the linzess. He is having problems with his dentures being loose, he is to go to the dentist to have them adjusted.   Wt Readings from Last 3 Encounters: 07/17/21 : 175 lb 6.4 oz (79.6 kg) 07/14/21 : 176 lb 4.8 oz (80 kg) 06/18/21 : 179 lb (81.2 kg)      Past Medical History:  Diagnosis Date   Cancer ([place])    Seborrheic keratosis, inflamed 10/16/2020   Skin Surgery      Family History  Adopted: Yes  Family history unknown: Yes     Current Outpatient Medications:    diclofenac Sodium (VOLTAREN) 1 % GEL, Apply 2 g topically 4 (four) times daily., Disp: 100 g, Rfl: 2   Lactobacillus TABS, Take 1 tablet by mouth daily., Disp: 90 tablet, Rfl: 1   linaclotide (LINZESS) 72 MCG capsule, Take 1 capsule (72 mcg total) by mouth daily before breakfast., Disp: 30 capsule,  Rfl: 5   oxybutynin (DITROPAN XL) 10 MG 24 hr tablet, Take 1 tablet (10 mg total) by mouth at bedtime., Disp: 90 tablet, Rfl: 1   polyethylene glycol powder (MIRALAX) 17 GM/SCOOP powder, Please take 6 capfuls of MiraLAX in a 32 oz bottle of Gatorade over 2-4 hour period. The following day take 3 capfuls. On day 3 start taking 1 capful 3 times a day. Slowly cut back as needed until you have normal bowel movements., Disp: 255 g, Rfl: 0   No Known Allergies   Review of Systems  Constitutional: Negative.   HENT:         Mouth irritation to bottom gums - history of chewing tobacco  Respiratory: Negative.    Cardiovascular: Negative.   Gastrointestinal: Negative.   Neurological: Negative.   Psychiatric/Behavioral: Negative.      Today's Vitals   07/17/21 1458  BP: 130/72  Pulse: 70  Temp: 98.6 F (37 C)  TempSrc: Oral  Weight: 175 lb 6.4 oz (79.6 kg)  Height: 5' 10.6" (1.793 m)   Body mass index is 24.74 kg/m.   Objective:  Physical Exam Vitals reviewed.  Constitutional:      General: He is not in acute distress.    Appearance: Normal appearance.  HENT:     Mouth/Throat:     Comments: Has whitened area to  lower gum area.  Cardiovascular:     Rate and Rhythm: Normal rate and regular rhythm.     Pulses: Normal pulses.     Heart sounds: Normal heart sounds. No murmur heard. Pulmonary:     Effort: Pulmonary effort is normal. No respiratory distress.     Breath sounds: Normal breath sounds. No wheezing.  Abdominal:     General: Abdomen is flat. Bowel sounds are normal. There is no distension.     Palpations: Abdomen is soft.     Tenderness: There is no abdominal tenderness.  Neurological:     General: No focal deficit present.     Mental Status: He is alert and oriented to person, place, and time.     Cranial Nerves: No cranial nerve deficit.     Motor: No weakness.  Psychiatric:        Mood and Affect: Mood normal.        Behavior: Behavior normal.        Thought  Content: Thought content normal.        Judgment: Judgment normal.        Assessment And Plan:     1. Chronic idiopathic constipation Comments: He is doing better after his ER visit, he is to check at home that he is taking Linzess regularly to avoid future episodes of constipation Encouraged to increase intake of vegetables and water   2. Decreased appetite Comments: Likely related to being constipated  3. Mouth ulceration Comments: Has an ulceration to his lower gum area, due to his history of chewing tobacco will refer to ENT. This may be causing him the inability to eat - Ambulatory referral to ENT     Patient was given opportunity to ask questions. Patient verbalized understanding of the plan and was able to repeat key elements of the plan. All questions were answered to their satisfaction.  [name], FNP   I, [name], FNP, have reviewed all documentation for this visit. The documentation on 07/17/21 for the exam, diagnosis, procedures, and orders are all accurate and complete.   IF YOU HAVE BEEN REFERRED TO A SPECIALIST, IT MAY TAKE 1-2 WEEKS TO SCHEDULE/PROCESS THE REFERRAL. IF YOU HAVE NOT HEARD FROM US/SPECIALIST IN TWO WEEKS, PLEASE GIVE [place] A CALL AT [phone].   THE PATIENT IS ENCOURAGED TO PRACTICE SOCIAL DISTANCING DUE TO THE COVID-19 PANDEMIC.

## 2021-07-17 NOTE — Patient Instructions (Addendum)
Constipation, Adult Constipation is when a person has trouble pooping (having a bowel movement). When you have this condition, you may poop fewer than 3 times a week. Your poop (stool) may also be dry, hard, or bigger than normal. Follow these instructions at home: Eating and drinking  Eat foods that have a lot of fiber, such as: Fresh fruits and vegetables. Whole grains. Beans. Eat less of foods that are low in fiber and high in fat and sugar, such as: [place] fries. Hamburgers. Cookies. Candy. Soda. Drink enough fluid to keep your pee (urine) pale yellow.  General instructions Exercise regularly or as told by your doctor. Try to do 150 minutes of exercise each week. Go to the restroom when you feel like you need to poop. Do not hold it in. Take over-the-counter and prescription medicines only as told by your doctor. These include any fiber supplements. When you poop: Do deep breathing while relaxing your lower belly (abdomen). Relax your pelvic floor. The pelvic floor is a group of muscles that support the rectum, bladder, and intestines (as well as the uterus in women). Watch your condition for any changes. Tell your doctor if you notice any. Keep all follow-up visits as told by your doctor. This is important. Contact a doctor if: You have pain that gets worse. You have a fever. You have not pooped for 4 days. You vomit. You are not hungry. You lose weight. You are bleeding from the opening of the butt (anus). You have thin, pencil-like poop. Get help right away if: You have a fever, and your symptoms suddenly get worse. You leak poop or have blood in your poop. Your belly feels hard or bigger than normal (bloated). You have very bad belly pain. You feel dizzy or you faint. Summary Constipation is when a person poops fewer than 3 times a week, has trouble pooping, or has poop that is dry, hard, or bigger than normal. Eat foods that have a lot of fiber. Drink enough fluid  to keep your pee (urine) pale yellow. Take over-the-counter and prescription medicines only as told by your doctor. These include any fiber supplements. This information is not intended to replace advice given to you by your health care provider. Make sure you discuss any questions you have with your healthcare provider. Document Revised: 10/19/2019 Document Reviewed: 10/19/2019 Elsevier Patient Education  [place] need to take benefiber supplement to help with bowels.

## 2021-07-19 ENCOUNTER — Telehealth: Payer: Self-pay | Admitting: *Deleted

## 2021-07-19 NOTE — Chronic Care Management (AMB) (Signed)
  Care Management   Note  07/19/2021 Name: Nathan Morse MRN: [id number] DOB: Mar 31, 1934  Nathan Morse is a 86 y.o. year old male who is a primary care patient of [name], [place] and is actively engaged with the care management team. I reached out to Arie Sabina by phone today to assist with re-scheduling an initial visit with the BSW  Follow up plan: Unsuccessful telephone outreach attempt made. A HIPAA compliant phone message was left for the patient providing contact information and requesting a return call.  The care management team will reach out to the patient again over the next 7-14 days.  If patient returns call to provider office, please advise to call [place] at [phone].  [place] Management  Direct Dial: [phone]

## 2021-07-31 NOTE — Chronic Care Management (AMB) (Signed)
  Care Management   Note  07/31/2021 Name: Jawann Hlavinka MRN: [id number] DOB: 25-Nov-1934  Nathan Morse is a 87 y.o. year old male who is a primary care patient of [name], [place] and is actively engaged with the care management team. I reached out to Arie Sabina by phone today to assist with re-scheduling an initial visit with the BSW  Follow up plan: A second unsuccessful telephone outreach attempt made. The care management team will reach out to the patient again over the next 7 days. If patient returns call to provider office, please advise to call [place] at [phone].  [place] Management  Direct Dial: [phone]

## 2021-08-01 NOTE — Chronic Care Management (AMB) (Signed)
  Care Management   Note  08/01/2021 Name: Nickalus Kras MRN: [id number] DOB: 11/18/1934  Brex Hardister is a 87 y.o. year old male who is a primary care patient of [name], [place] and is actively engaged with the care management team. I reached out to Arie Sabina by phone today to assist with re-scheduling an initial visit with the BSW  Follow up plan: A third unsuccessful telephone outreach attempt made. A HIPAA compliant phone message was left for the patient providing contact information and requesting a return call. Unable to make contact on outreach attempts x 3. PCP [name], FNP notified via routed documentation in medical record. We have been unable to make contact with the patient for follow up. The care management team is available to follow up with the patient after provider conversation with the patient regarding recommendation for care management engagement and subsequent re-referral to the care management team. If patient returns call to provider office, please advise to call [place] at [place]: [phone]

## 2021-08-07 ENCOUNTER — Encounter: Payer: Self-pay | Admitting: Podiatry

## 2021-08-07 ENCOUNTER — Ambulatory Visit (INDEPENDENT_AMBULATORY_CARE_PROVIDER_SITE_OTHER): Payer: Medicare HMO | Admitting: Podiatry

## 2021-08-07 ENCOUNTER — Other Ambulatory Visit: Payer: Self-pay

## 2021-08-07 DIAGNOSIS — B351 Tinea unguium: Secondary | ICD-10-CM | POA: Diagnosis not present

## 2021-08-07 DIAGNOSIS — M79674 Pain in right toe(s): Secondary | ICD-10-CM | POA: Diagnosis not present

## 2021-08-07 DIAGNOSIS — M79675 Pain in left toe(s): Secondary | ICD-10-CM | POA: Diagnosis not present

## 2021-08-07 DIAGNOSIS — L84 Corns and callosities: Secondary | ICD-10-CM

## 2021-08-11 NOTE — Progress Notes (Signed)
Subjective: Nathan Morse is a 87 y.o. male patient seen today for follow up of corns b/l 5th digits and   painful thick toenails that are difficult to trim. Pain interferes with ambulation. Aggravating factors include wearing enclosed shoe gear. Pain is relieved with periodic professional debridement.  New problems reported today: None.  No Known Allergies  PCP is [name], FNP .  Objective: Physical Exam  General: Patient is a pleasant 87 y.o. African American male WD, WN in NAD. AAO x 3.   Neurovascular Examination: Capillary refill time to digits immediate b/l. Palpable pedal pulses b/l LE. Pedal hair absent b/l.  Lower extremity skin temperature gradient within normal limits. No edema noted b/l lower extremities.   Protective sensation intact 5/5 intact bilaterally with 10g monofilament b/l. Vibratory sensation intact b/l.  Dermatological:  Skin warm and supple b/l lower extremities. No open wounds b/l lower extremities. No interdigital macerations b/l lower extremities. Toenails 1-5 b/l elongated, discolored, dystrophic, thickened, crumbly with subungual debris and tenderness to dorsal palpation.  Hyperkeratotic lesion(s) L 5th toe and R 5th toe. No erythema, no edema, no drainage, no fluctuance noted.   Musculoskeletal:  Normal muscle strength 5/5 to all lower extremity muscle groups bilaterally. No pain crepitus or joint limitation noted with ROM b/l lower extremities.  Assessment: 1. Pain due to onychomycosis of toenails of both feet   2. Corns   3. Pain in toes of both feet     Plan:  -Examined patient. -No new findings. No new orders. -Patient signed ABN for paring of corns on today's visit. -Toenails 1-5 b/l were debrided in length and girth with sterile nail nippers and dremel without iatrogenic bleeding.  -Corn(s) pared b/l 5th digits utilizing sterile scalpel blade without incident.  -Patient to report any pedal injuries to medical professional  immediately. -Patient/POA to call should there be question/concern in the interim.  Return in about 3 months (around 11/07/2021).  Marzetta Board, DPM

## 2021-08-23 DIAGNOSIS — Z8546 Personal history of malignant neoplasm of prostate: Secondary | ICD-10-CM | POA: Diagnosis not present

## 2021-08-23 DIAGNOSIS — Z008 Encounter for other general examination: Secondary | ICD-10-CM | POA: Diagnosis not present

## 2021-08-23 DIAGNOSIS — Z0000 Encounter for general adult medical examination without abnormal findings: Secondary | ICD-10-CM | POA: Diagnosis not present

## 2021-08-23 DIAGNOSIS — R350 Frequency of micturition: Secondary | ICD-10-CM | POA: Diagnosis not present

## 2021-08-23 DIAGNOSIS — Z550 Illiteracy and low-level literacy: Secondary | ICD-10-CM | POA: Diagnosis not present

## 2021-08-23 DIAGNOSIS — F17220 Nicotine dependence, chewing tobacco, uncomplicated: Secondary | ICD-10-CM | POA: Diagnosis not present

## 2021-08-23 DIAGNOSIS — K5900 Constipation, unspecified: Secondary | ICD-10-CM | POA: Diagnosis not present

## 2021-09-05 ENCOUNTER — Other Ambulatory Visit: Payer: Self-pay

## 2021-09-05 ENCOUNTER — Ambulatory Visit (INDEPENDENT_AMBULATORY_CARE_PROVIDER_SITE_OTHER): Payer: Medicare HMO | Admitting: Otolaryngology

## 2021-09-05 DIAGNOSIS — J029 Acute pharyngitis, unspecified: Secondary | ICD-10-CM | POA: Diagnosis not present

## 2021-09-05 NOTE — Progress Notes (Signed)
HPI: Nathan Morse is a 87 y.o. male who presents is referred by his PCP for evaluation of soreness on the lower gum region.  He wears dentures and recently had some work done of the dentures in the soreness is doing a little bit better presently.  Patient does chew tobacco..  Past Medical History:  Diagnosis Date   Cancer ([place])    Seborrheic keratosis, inflamed 10/16/2020   Skin Surgery    Past Surgical History:  Procedure Laterality Date   APPENDECTOMY     INGUINAL HERNIA REPAIR Right 06/26/2020   Procedure: right open inguinal hernia repair with mesh;  Surgeon: Kinsinger, Arta Bruce, MD;  Location: [facility];  Service: General;  Laterality: Right;   PENILE BIOPSY     SHOULDER ARTHROSCOPY     TONSILLECTOMY     TONSILLECTOMY AND ADENOIDECTOMY     Social History   Socioeconomic History   Marital status: Widowed    Spouse name: Not on file   Number of children: Not on file   Years of education: Not on file   Highest education level: Not on file  Occupational History   Occupation: retired  Tobacco Use   Smoking status: Never   Smokeless tobacco: Current    Types: Chew   Tobacco comments:    wants to quit, but hard  Vaping Use   Vaping Use: Never used  Substance and Sexual Activity   Alcohol use: No    Alcohol/week: 0.0 standard drinks   Drug use: No   Sexual activity: Yes  Other Topics Concern   Not on file  Social History Narrative   Not on file   Social Determinants of Health   Financial Resource Strain: Low Risk    Difficulty of Paying Living Expenses: Not hard at all  Food Insecurity: No Food Insecurity   Worried About Charity fundraiser in the Last Year: Never true   [place] in the Last Year: Never true  Transportation Needs: No Transportation Needs   Lack of Transportation (Medical): No   Lack of Transportation (Non-Medical): No  Physical Activity: Inactive   Days of Exercise per Week: 0 days   Minutes of Exercise per Session: 0 min  Stress: No  Stress Concern Present   Feeling of Stress : Not at all  Social Connections: Not on file   Family History  Adopted: Yes  Family history unknown: Yes   No Known Allergies Prior to Admission medications   Medication Sig Start Date End Date Taking? Authorizing Provider  diclofenac Sodium (VOLTAREN) 1 % GEL Apply 2 g topically 4 (four) times daily. 06/18/21   [name], FNP  Lactobacillus TABS Take 1 tablet by mouth daily. 04/23/21   [name], FNP  linaclotide Rolan Lipa) 72 MCG capsule Take 1 capsule (72 mcg total) by mouth daily before breakfast. 04/23/21   [name], FNP  oxybutynin (DITROPAN XL) 10 MG 24 hr tablet Take 1 tablet (10 mg total) by mouth at bedtime. 04/23/21   [name], FNP  polyethylene glycol powder (MIRALAX) 17 GM/SCOOP powder Please take 6 capfuls of MiraLAX in a 32 oz bottle of Gatorade over 2-4 hour period. The following day take 3 capfuls. On day 3 start taking 1 capful 3 times a day. Slowly cut back as needed until you have normal bowel movements. 07/14/21   Fatima Blank, MD     Positive ROS: Otherwise negative.  All other systems have been reviewed and were otherwise negative with the  exception of those mentioned in the HPI and as above.  Physical Exam: Constitutional: Alert, well-appearing, no acute distress Ears: External ears without lesions or tenderness. Ear canals are clear bilaterally with intact, clear TMs.  Nasal: External nose without lesions.. Clear nasal passages Oral: Lips and gums without lesions.  After removing his dentures on close examination of the upper and lower alveolus there are no ulcerations or significant leukoplakia or evidence of neoplasia.  He has a very low mandibular ridge making fitting dentures a little bit difficult in the dentures to have a tendency to move when he eats and chews.  But there is no mucosal abnormalities noted.  The floor mouth is clear.  The buccal mucosal region although slightly discolored is  clear with no ulcerations.  The palate mucosa was normal.  He is status post tonsillectomy with missing uvula and the oropharynx is clear. Neck: No palpable adenopathy or masses.  No palpable adenopathy in the submandibular region or along the jugular chain lymph nodes on either side. Respiratory: Breathing comfortably  Skin: No facial/neck lesions or rash noted.  Procedures  Assessment: Gum soreness secondary to poor denture fitting.  No clinical evidence of neoplasia or infection.  Plan: Reassured the patient as well as his friend concerning no evidence of neoplasia or cancer.  Would recommend better fitting of the dentures if possible.   Radene Journey, MD   CC:

## 2021-10-21 ENCOUNTER — Telehealth: Payer: Self-pay | Admitting: Nurse Practitioner

## 2021-10-21 NOTE — Telephone Encounter (Signed)
Tried calling patient to schedule Medicare Annual Wellness Visit (AWV) either virtually or in office.   No answer   Last AWV 11/28/20  please schedule at anytime with [facility]    This should be a 45 minute visit.

## 2021-10-24 ENCOUNTER — Other Ambulatory Visit: Payer: Self-pay

## 2021-10-24 ENCOUNTER — Ambulatory Visit (INDEPENDENT_AMBULATORY_CARE_PROVIDER_SITE_OTHER): Payer: Medicare Other

## 2021-10-24 VITALS — BP 122/60 | HR 99 | Temp 98.1°F | Ht 70.2 in | Wt 176.2 lb

## 2021-10-24 DIAGNOSIS — Z0000 Encounter for general adult medical examination without abnormal findings: Secondary | ICD-10-CM | POA: Diagnosis not present

## 2021-10-24 NOTE — Progress Notes (Signed)
This visit occurred during the SARS-CoV-2 public health emergency.  Safety protocols were in place, including screening questions prior to the visit, additional usage of staff PPE, and extensive cleaning of exam room while observing appropriate contact time as indicated for disinfecting solutions.  Subjective:   Nathan Morse is a 87 y.o. male who presents for Medicare Annual/Subsequent preventive examination.  Review of Systems     Cardiac Risk Factors include: advanced age (>28men, >70 women);male gender;sedentary lifestyle;smoking/ tobacco exposure     Objective:    Today's Vitals   10/24/21 1107  BP: 122/60  Pulse: 99  Temp: 98.1 F (36.7 C)  TempSrc: Oral  SpO2: 98%  Weight: 176 lb 3.2 oz (79.9 kg)  Height: 5' 10.2" (1.783 m)   Body mass index is 25.14 kg/m.  Advanced Directives 10/24/2021 11/28/2020 06/21/2020 07/12/2019 11/17/2018 05/28/2017  Does Patient Have a Medical Advance Directive? Yes Yes No No Yes No  Type of Advance Directive Living will [place];Living will - - - -  Does patient want to make changes to medical advance directive? - - - - No - Patient declined -  Copy of [place] in Chart? - No - copy requested - - - -  Would patient like information on creating a medical advance directive? - - No - Patient declined No - Patient declined - -    Current Medications (verified) Outpatient Encounter Medications as of 10/24/2021  Medication Sig   diclofenac Sodium (VOLTAREN) 1 % GEL Apply 2 g topically 4 (four) times daily.   linaclotide (LINZESS) 72 MCG capsule Take 1 capsule (72 mcg total) by mouth daily before breakfast.   oxybutynin (DITROPAN XL) 10 MG 24 hr tablet Take 1 tablet (10 mg total) by mouth at bedtime.   polyethylene glycol powder (MIRALAX) 17 GM/SCOOP powder Please take 6 capfuls of MiraLAX in a 32 oz bottle of Gatorade over 2-4 hour period. The following day take 3 capfuls. On day 3 start taking 1 capful 3 times a  day. Slowly cut back as needed until you have normal bowel movements.   Lactobacillus TABS Take 1 tablet by mouth daily.   No facility-administered encounter medications on file as of 10/24/2021.    Allergies (verified) Patient has no known allergies.   History: Past Medical History:  Diagnosis Date   Cancer ([place])    Seborrheic keratosis, inflamed 10/16/2020   Skin Surgery    Past Surgical History:  Procedure Laterality Date   APPENDECTOMY     INGUINAL HERNIA REPAIR Right 06/26/2020   Procedure: right open inguinal hernia repair with mesh;  Surgeon: Kinsinger, Arta Bruce, MD;  Location: [facility];  Service: General;  Laterality: Right;   PENILE BIOPSY     SHOULDER ARTHROSCOPY     TONSILLECTOMY     TONSILLECTOMY AND ADENOIDECTOMY     Family History  Adopted: Yes  Family history unknown: Yes   Social History   Socioeconomic History   Marital status: Widowed    Spouse name: Not on file   Number of children: Not on file   Years of education: Not on file   Highest education level: Not on file  Occupational History   Occupation: retired  Tobacco Use   Smoking status: Never   Smokeless tobacco: Current    Types: Chew   Tobacco comments:    wants to quit, but hard  Vaping Use   Vaping Use: Never used  Substance and Sexual Activity   Alcohol use: No  Alcohol/week: 0.0 standard drinks   Drug use: No   Sexual activity: Not Currently  Other Topics Concern   Not on file  Social History Narrative   Not on file   Social Determinants of Health   Financial Resource Strain: Low Risk    Difficulty of Paying Living Expenses: Not hard at all  Food Insecurity: No Food Insecurity   Worried About Charity fundraiser in the Last Year: Never true   [place] in the Last Year: Never true  Transportation Needs: No Transportation Needs   Lack of Transportation (Medical): No   Lack of Transportation (Non-Medical): No  Physical Activity: Inactive   Days of Exercise per Week:  0 days   Minutes of Exercise per Session: 0 min  Stress: No Stress Concern Present   Feeling of Stress : Not at all  Social Connections: Not on file    Tobacco Counseling Ready to quit: Not Answered Counseling given: Not Answered Tobacco comments: wants to quit, but hard   Clinical Intake:  Pre-visit preparation completed: Yes  Pain : No/denies pain     Nutritional Status: BMI 25 -29 Overweight Nutritional Risks: None Diabetes: No  How often do you need to have someone help you when you read instructions, pamphlets, or other written materials from your doctor or pharmacy?: 1 - Never What is the last grade level you completed in school?: 3rd grade  Diabetic? no  Interpreter Needed?: No  Information entered by :: NAllen LPN   Activities of Daily Living In your present state of health, do you have any difficulty performing the following activities: 10/24/2021 11/28/2020  Hearing? N N  Vision? N N  Difficulty concentrating or making decisions? Tempie Donning  Walking or climbing stairs? N N  Dressing or bathing? N N  Doing errands, shopping? N N  Preparing Food and eating ? N N  Using the Toilet? N N  In the past six months, have you accidently leaked urine? Y N  Do you have problems with loss of bowel control? N N  Managing your Medications? N N  Managing your Finances? N N  Housekeeping or managing your Housekeeping? N N  Some recent data might be hidden    Patient Care Team: [name], FNP as PCP - General (General Practice)  Indicate any recent Medical Services you may have received from other than [facility] providers in the past year (date may be approximate).     Assessment:   This is a routine wellness examination for Nathan Morse.  Hearing/Vision screen Vision Screening - Comments:: Regular eye exams,   Dietary issues and exercise activities discussed: Current Exercise Habits: The patient does not participate in regular exercise at present   Goals Addressed              This Visit's Progress    Patient Stated       10/24/2021, no goals       Depression Screen PHQ 2/9 Scores 10/24/2021 11/28/2020 11/24/2019 09/29/2019 07/12/2019 05/19/2019 11/17/2018  PHQ - 2 Score 0 0 0 0 0 0 0  PHQ- 9 Score - - - - 9 - 9    Fall Risk Fall Risk  10/24/2021 11/28/2020 11/24/2019 09/29/2019 07/12/2019  Falls in the past year? 0 0 0 0 0  Number falls in past yr: - - - - -  Comment - - - - -  Injury with Fall? - - - - -  Risk for fall due to : Medication  side effect Medication side effect - - Medication side effect  Follow up Falls evaluation completed;Education provided;Falls prevention discussed Falls evaluation completed;Falls prevention discussed;Education provided - - Falls evaluation completed;Education provided;Falls prevention discussed    FALL RISK PREVENTION PERTAINING TO THE HOME:  Any stairs in or around the home? No  If so, are there any without handrails? N/a Home free of loose throw rugs in walkways, pet beds, electrical cords, etc? Yes  Adequate lighting in your home to reduce risk of falls? Yes   ASSISTIVE DEVICES UTILIZED TO PREVENT FALLS:  Life alert? No  Use of a cane, walker or w/c? No  Grab bars in the bathroom? Yes  Shower chair or bench in shower? Yes  Elevated toilet seat or a handicapped toilet? Yes   TIMED UP AND GO:  Was the test performed? No .      Cognitive Function:     6CIT Screen 11/28/2020 07/12/2019 11/17/2018  What Year? 4 points 0 points 0 points  What month? 0 points 0 points 0 points  What time? 0 points 0 points 0 points  Count back from 20 2 points 2 points 2 points  Months in reverse 4 points 4 points 4 points  Repeat phrase 10 points 0 points 6 points  Total Score 20 6 12     Immunizations Immunization History  Administered Date(s) Administered   Fluad Quad(high Dose 65+) 08/23/2019, 09/03/2020   Influenza Nasal 08/23/2019   Influenza, High Dose Seasonal PF 11/17/2018   Moderna Sars-Covid-2  Vaccination 01/28/2020, 02/25/2020   PFIZER(Purple Top)SARS-COV-2 Vaccination 04/30/2021   PNEUMOCOCCAL CONJUGATE-20 06/05/2021, 06/05/2021   Tdap 05/21/2012   Zoster Recombinat (Shingrix) 06/15/2017, 08/17/2017    TDAP status: Up to date  Flu Vaccine status: Due, Education has been provided regarding the importance of this vaccine. Advised may receive this vaccine at local pharmacy or Health Dept. Aware to provide a copy of the vaccination record if obtained from local pharmacy or Health Dept. Verbalized acceptance and understanding.  Pneumococcal vaccine status: Up to date  Covid-19 vaccine status: Completed vaccines  Qualifies for Shingles Vaccine? Yes   Zostavax completed No   Shingrix Completed?: Yes  Screening Tests Health Maintenance  Topic Date Due   COVID-19 Vaccine (4 - Booster) 06/25/2021   INFLUENZA VACCINE  07/15/2021   TETANUS/TDAP  05/21/2022   Pneumonia Vaccine 102+ Years old  Completed   Zoster Vaccines- Shingrix  Completed   HPV VACCINES  Aged Out    Health Maintenance  Health Maintenance Due  Topic Date Due   COVID-19 Vaccine (4 - Booster) 06/25/2021   INFLUENZA VACCINE  07/15/2021    Colorectal cancer screening: No longer required.   Lung Cancer Screening: (Low Dose CT Chest recommended if Age 6-80 years, 30 pack-year currently smoking OR have quit w/in 15years.) does not qualify.   Lung Cancer Screening Referral: no  Additional Screening:  Hepatitis C Screening: does not qualify;  Vision Screening: Recommended annual ophthalmology exams for early detection of glaucoma and other disorders of the eye. Is the patient up to date with their annual eye exam?  Yes  Who is the provider or what is the name of the office in which the patient attends annual eye exams? Can't remember name If pt is not established with a provider, would they like to be referred to a provider to establish care? No .   Dental Screening: Recommended annual dental exams for  proper oral hygiene  Community Resource Referral / Chronic Care Management: CRR required  this visit?  No   CCM required this visit?  No      Plan:     I have personally reviewed and noted the following in the patient's chart:   Medical and social history Use of alcohol, tobacco or illicit drugs  Current medications and supplements including opioid prescriptions. Patient is not currently taking opioid prescriptions. Functional ability and status Nutritional status Physical activity Advanced directives List of other physicians Hospitalizations, surgeries, and ER visits in previous 12 months Vitals Screenings to include cognitive, depression, and falls Referrals and appointments  In addition, I have reviewed and discussed with patient certain preventive protocols, quality metrics, and best practice recommendations. A written personalized care plan for preventive services as well as general preventive health recommendations were provided to patient.     Kellie Simmering, LPN   [id number]   Nurse Notes: 6 CIT not administered. Patient schooled only to 3rd grade. Appears cognitive per direct observation

## 2021-10-24 NOTE — Patient Instructions (Signed)
Nathan Morse , Thank you for taking time to come for your Medicare Wellness Visit. I appreciate your ongoing commitment to your health goals. Please review the following plan we discussed and let me know if I can assist you in the future.   Screening recommendations/referrals: Colonoscopy: not required Recommended yearly ophthalmology/optometry visit for glaucoma screening and checkup Recommended yearly dental visit for hygiene and checkup  Vaccinations: Influenza vaccine: decline Pneumococcal vaccine: completed 06/05/2021 Tdap vaccine: completed 05/21/2012, due 05/21/2022 Shingles vaccine: completed   Covid-19:  04/30/2021, 02/25/2020, 01/28/2020  Advanced directives: Please bring a copy of your POA (Power of Attorney) and/or Living Will to your next appointment.   Conditions/risks identified: chews tobacco  Next appointment: Follow up in one year for your annual wellness visit.   Preventive Care 23 Years and Older, Male Preventive care refers to lifestyle choices and visits with your health care provider that can promote health and wellness. What does preventive care include? A yearly physical exam. This is also called an annual well check. Dental exams once or twice a year. Routine eye exams. Ask your health care provider how often you should have your eyes checked. Personal lifestyle choices, including: Daily care of your teeth and gums. Regular physical activity. Eating a healthy diet. Avoiding tobacco and drug use. Limiting alcohol use. Practicing safe sex. Taking low doses of aspirin every day. Taking vitamin and mineral supplements as recommended by your health care provider. What happens during an annual well check? The services and screenings done by your health care provider during your annual well check will depend on your age, overall health, lifestyle risk factors, and family history of disease. Counseling  Your health care provider may ask you questions about  your: Alcohol use. Tobacco use. Drug use. Emotional well-being. Home and relationship well-being. Sexual activity. Eating habits. History of falls. Memory and ability to understand (cognition). Work and work Statistician. Screening  You may have the following tests or measurements: Height, weight, and BMI. Blood pressure. Lipid and cholesterol levels. These may be checked every 5 years, or more frequently if you are over 69 years old. Skin check. Lung cancer screening. You may have this screening every year starting at age 73 if you have a 30-pack-year history of smoking and currently smoke or have quit within the past 15 years. Fecal occult blood test (FOBT) of the stool. You may have this test every year starting at age 81. Flexible sigmoidoscopy or colonoscopy. You may have a sigmoidoscopy every 5 years or a colonoscopy every 10 years starting at age 36. Prostate cancer screening. Recommendations will vary depending on your family history and other risks. Hepatitis C blood test. Hepatitis B blood test. Sexually transmitted disease (STD) testing. Diabetes screening. This is done by checking your blood sugar (glucose) after you have not eaten for a while (fasting). You may have this done every 1-3 years. Abdominal aortic aneurysm (AAA) screening. You may need this if you are a current or former smoker. Osteoporosis. You may be screened starting at age 74 if you are at high risk. Talk with your health care provider about your test results, treatment options, and if necessary, the need for more tests. Vaccines  Your health care provider may recommend certain vaccines, such as: Influenza vaccine. This is recommended every year. Tetanus, diphtheria, and acellular pertussis (Tdap, Td) vaccine. You may need a Td booster every 10 years. Zoster vaccine. You may need this after age 26. Pneumococcal 13-valent conjugate (PCV13) vaccine. One dose is recommended  after age 21. Pneumococcal  polysaccharide (PPSV23) vaccine. One dose is recommended after age 42. Talk to your health care provider about which screenings and vaccines you need and how often you need them. This information is not intended to replace advice given to you by your health care provider. Make sure you discuss any questions you have with your health care provider. Document Released: 12/28/2015 Document Revised: 08/20/2016 Document Reviewed: 10/02/2015 Elsevier Interactive Patient Education  2017 [place] Prevention in the Home Falls can cause injuries. They can happen to people of all ages. There are many things you can do to make your home safe and to help prevent falls. What can I do on the outside of my home? Regularly fix the edges of walkways and driveways and fix any cracks. Remove anything that might make you trip as you walk through a door, such as a raised step or threshold. Trim any bushes or trees on the path to your home. Use bright outdoor lighting. Clear any walking paths of anything that might make someone trip, such as rocks or tools. Regularly check to see if handrails are loose or broken. Make sure that both sides of any steps have handrails. Any raised decks and porches should have guardrails on the edges. Have any leaves, snow, or ice cleared regularly. Use sand or salt on walking paths during winter. Clean up any spills in your garage right away. This includes oil or grease spills. What can I do in the bathroom? Use night lights. Install grab bars by the toilet and in the tub and shower. Do not use towel bars as grab bars. Use non-skid mats or decals in the tub or shower. If you need to sit down in the shower, use a plastic, non-slip stool. Keep the floor dry. Clean up any water that spills on the floor as soon as it happens. Remove soap buildup in the tub or shower regularly. Attach bath mats securely with double-sided non-slip rug tape. Do not have throw rugs and other  things on the floor that can make you trip. What can I do in the bedroom? Use night lights. Make sure that you have a light by your bed that is easy to reach. Do not use any sheets or blankets that are too big for your bed. They should not hang down onto the floor. Have a firm chair that has side arms. You can use this for support while you get dressed. Do not have throw rugs and other things on the floor that can make you trip. What can I do in the kitchen? Clean up any spills right away. Avoid walking on wet floors. Keep items that you use a lot in easy-to-reach places. If you need to reach something above you, use a strong step stool that has a grab bar. Keep electrical cords out of the way. Do not use floor polish or wax that makes floors slippery. If you must use wax, use non-skid floor wax. Do not have throw rugs and other things on the floor that can make you trip. What can I do with my stairs? Do not leave any items on the stairs. Make sure that there are handrails on both sides of the stairs and use them. Fix handrails that are broken or loose. Make sure that handrails are as long as the stairways. Check any carpeting to make sure that it is firmly attached to the stairs. Fix any carpet that is loose or worn. Avoid having throw  rugs at the top or bottom of the stairs. If you do have throw rugs, attach them to the floor with carpet tape. Make sure that you have a light switch at the top of the stairs and the bottom of the stairs. If you do not have them, ask someone to add them for you. What else can I do to help prevent falls? Wear shoes that: Do not have high heels. Have rubber bottoms. Are comfortable and fit you well. Are closed at the toe. Do not wear sandals. If you use a stepladder: Make sure that it is fully opened. Do not climb a closed stepladder. Make sure that both sides of the stepladder are locked into place. Ask someone to hold it for you, if possible. Clearly  mark and make sure that you can see: Any grab bars or handrails. First and last steps. Where the edge of each step is. Use tools that help you move around (mobility aids) if they are needed. These include: Canes. Walkers. Scooters. Crutches. Turn on the lights when you go into a dark area. Replace any light bulbs as soon as they burn out. Set up your furniture so you have a clear path. Avoid moving your furniture around. If any of your floors are uneven, fix them. If there are any pets around you, be aware of where they are. Review your medicines with your doctor. Some medicines can make you feel dizzy. This can increase your chance of falling. Ask your doctor what other things that you can do to help prevent falls. This information is not intended to replace advice given to you by your health care provider. Make sure you discuss any questions you have with your health care provider. Document Released: 09/27/2009 Document Revised: 05/08/2016 Document Reviewed: 01/05/2015 Elsevier Interactive Patient Education  2017 [employer].

## 2021-11-04 ENCOUNTER — Ambulatory Visit (INDEPENDENT_AMBULATORY_CARE_PROVIDER_SITE_OTHER): Payer: Medicare Other | Admitting: Nurse Practitioner

## 2021-11-04 ENCOUNTER — Encounter: Payer: Self-pay | Admitting: Nurse Practitioner

## 2021-11-04 ENCOUNTER — Other Ambulatory Visit: Payer: Self-pay

## 2021-11-04 VITALS — BP 120/66 | HR 68 | Temp 98.9°F | Ht 70.2 in | Wt 171.4 lb

## 2021-11-04 DIAGNOSIS — R3981 Functional urinary incontinence: Secondary | ICD-10-CM

## 2021-11-04 DIAGNOSIS — R059 Cough, unspecified: Secondary | ICD-10-CM | POA: Diagnosis not present

## 2021-11-04 DIAGNOSIS — R630 Anorexia: Secondary | ICD-10-CM | POA: Diagnosis not present

## 2021-11-04 DIAGNOSIS — R590 Localized enlarged lymph nodes: Secondary | ICD-10-CM

## 2021-11-04 DIAGNOSIS — Z6824 Body mass index (BMI) 24.0-24.9, adult: Secondary | ICD-10-CM

## 2021-11-04 MED ORDER — OXYBUTYNIN CHLORIDE ER 10 MG PO TB24: 10.0000 mg | ORAL_TABLET | Freq: Every day | ORAL | 1 refills | Status: DC

## 2021-11-04 NOTE — Progress Notes (Signed)
I,Yamilka J Llittleton,acting as a Education administrator for [employer], FNP.,have documented all relevant documentation on the behalf of [name], FNP,as directed by  [name], FNP while in the presence of [name], [place].   This visit occurred during the SARS-CoV-2 public health emergency.  Safety protocols were in place, including screening questions prior to the visit, additional usage of staff PPE, and extensive cleaning of exam room while observing appropriate contact time as indicated for disinfecting solutions.  Subjective:     Patient ID: Nathan Morse , male    DOB: 05-14-1934 , 87 y.o.   MRN: [id number]   Chief Complaint  Patient presents with   Cough    HPI  He reports having a dry cough last week this week he is doing better. Every now and then would have white phlegm. He did feel bad but did not check for covid.   Wt Readings from Last 3 Encounters: 11/04/21 : 171 lb 6.4 oz (77.7 kg) 10/24/21 : 176 lb 3.2 oz (79.9 kg) 07/17/21 : 175 lb 6.4 oz (79.6 kg)    Cough This is a new problem. The current episode started 1 to 4 weeks ago. The problem has been gradually improving. The problem occurs constantly. The cough is Non-productive. Associated symptoms include nasal congestion. Pertinent negatives include no fever, headaches or sore throat. The symptoms are aggravated by cold air. He has tried OTC cough suppressant for the symptoms. The treatment provided no relief.    Past Medical History:  Diagnosis Date   Cancer ([place])    Seborrheic keratosis, inflamed 10/16/2020   Skin Surgery      Family History  Adopted: Yes  Family history unknown: Yes     Current Outpatient Medications:    diclofenac Sodium (VOLTAREN) 1 % GEL, Apply 2 g topically 4 (four) times daily., Disp: 100 g, Rfl: 2   Lactobacillus TABS, Take 1 tablet by mouth daily., Disp: 90 tablet, Rfl: 1   linaclotide (LINZESS) 72 MCG capsule, Take 1 capsule (72 mcg total) by mouth daily before breakfast., Disp: 30  capsule, Rfl: 5   polyethylene glycol powder (MIRALAX) 17 GM/SCOOP powder, Please take 6 capfuls of MiraLAX in a 32 oz bottle of Gatorade over 2-4 hour period. The following day take 3 capfuls. On day 3 start taking 1 capful 3 times a day. Slowly cut back as needed until you have normal bowel movements., Disp: 255 g, Rfl: 0   oxybutynin (DITROPAN XL) 10 MG 24 hr tablet, Take 1 tablet (10 mg total) by mouth at bedtime., Disp: 90 tablet, Rfl: 1   No Known Allergies   Review of Systems  Constitutional: Negative.  Negative for fever.  HENT:  Negative for sore throat.   Respiratory:  Positive for cough (dry).   Neurological:  Negative for dizziness and headaches.  Psychiatric/Behavioral: Negative.      Today's Vitals   11/04/21 1431  BP: 120/66  Pulse: 68  Temp: 98.9 F (37.2 C)  Weight: 171 lb 6.4 oz (77.7 kg)  Height: 5' 10.2" (1.783 m)  PainSc: 0-No pain   Body mass index is 24.45 kg/m.   Objective:  Physical Exam Vitals reviewed.  Constitutional:      General: He is not in acute distress.    Appearance: Normal appearance.  Cardiovascular:     Rate and Rhythm: Normal rate and regular rhythm.     Pulses: Normal pulses.     Heart sounds: Normal heart sounds. No murmur heard. Pulmonary:  Effort: Pulmonary effort is normal. No respiratory distress.     Breath sounds: Normal breath sounds. No stridor. No wheezing, rhonchi or rales.  Chest:     Chest wall: No tenderness.  Neurological:     General: No focal deficit present.     Mental Status: He is alert and oriented to person, place, and time.     Cranial Nerves: No cranial nerve deficit.     Motor: No weakness.  Psychiatric:        Mood and Affect: Mood normal.        Behavior: Behavior normal.        Thought Content: Thought content normal.        Judgment: Judgment normal.        Assessment And Plan:     1. Cough, unspecified type Comments: Reports he is no longer coughing, if worsens return call to office will  consider CXR  2. BMI 24.0-24.9, adult  3. Lymphadenopathy, cervical Comments: Bilateral anterior cervical chain are enlarged. He has a history of chewing tobacco.  - US Soft Tissue Head/Neck (NON-THYROID); Future  4. Decreased appetite Comments: Will check neck ultrasound due to his history of chewing tobacco to make sure not concerning for masses  5. Functional urinary incontinence - oxybutynin (DITROPAN XL) 10 MG 24 hr tablet; Take 1 tablet (10 mg total) by mouth at bedtime.  Dispense: 90 tablet; Refill: 1    Patient was given opportunity to ask questions. Patient verbalized understanding of the plan and was able to repeat key elements of the plan. All questions were answered to their satisfaction.  [name], FNP   I, [name], FNP, have reviewed all documentation for this visit. The documentation on 11/04/21 for the exam, diagnosis, procedures, and orders are all accurate and complete.   IF YOU HAVE BEEN REFERRED TO A SPECIALIST, IT MAY TAKE 1-2 WEEKS TO SCHEDULE/PROCESS THE REFERRAL. IF YOU HAVE NOT HEARD FROM US/SPECIALIST IN TWO WEEKS, PLEASE GIVE [place] A CALL AT [phone].   THE PATIENT IS ENCOURAGED TO PRACTICE SOCIAL DISTANCING DUE TO THE COVID-19 PANDEMIC.

## 2021-11-15 ENCOUNTER — Ambulatory Visit
Admission: RE | Admit: 2021-11-15 | Discharge: 2021-11-15 | Disposition: A | Discharge status: Home / Self Care | Payer: Medicare Other | Source: Ambulatory Visit | Attending: Nurse Practitioner | Admitting: Nurse Practitioner

## 2021-11-15 ENCOUNTER — Encounter: Payer: Self-pay | Admitting: Podiatry

## 2021-11-15 ENCOUNTER — Other Ambulatory Visit: Payer: Self-pay

## 2021-11-15 ENCOUNTER — Ambulatory Visit (INDEPENDENT_AMBULATORY_CARE_PROVIDER_SITE_OTHER): Payer: Medicare Other | Admitting: Podiatry

## 2021-11-15 DIAGNOSIS — M79675 Pain in left toe(s): Secondary | ICD-10-CM

## 2021-11-15 DIAGNOSIS — B351 Tinea unguium: Secondary | ICD-10-CM | POA: Diagnosis not present

## 2021-11-15 DIAGNOSIS — R590 Localized enlarged lymph nodes: Secondary | ICD-10-CM

## 2021-11-15 DIAGNOSIS — Z87891 Personal history of nicotine dependence: Secondary | ICD-10-CM | POA: Diagnosis not present

## 2021-11-15 DIAGNOSIS — M79674 Pain in right toe(s): Secondary | ICD-10-CM

## 2021-11-20 NOTE — Progress Notes (Signed)
  Subjective:  Patient ID: Nathan Morse, male    DOB: 20-Jun-1934,  MRN: [id number]  Voyd Groft presents to clinic today for painful elongated mycotic toenails 1-5 bilaterally which are tender when wearing enclosed shoe gear. Pain is relieved with periodic professional debridement.  He relates the left 5th digit corn is not bothering him on today. Has signed a refusal ABN on last visit  PCP is [name], FNP , and last visit was 11/04/2021.  No Known Allergies  Review of Systems: Negative except as noted in the HPI. Objective:   Constitutional Jacon Whetzel is a pleasant 87 y.o. African American male, WD, WN in NAD. AAO x 3.   Vascular CFT immediate b/l LE. Palpable DP/PT pulses b/l LE. Digital hair absent b/l. Skin temperature gradient WNL b/l. No pain with calf compression b/l. No edema noted b/l. No cyanosis or clubbing noted b/l LE.  Neurologic Normal speech. Oriented to person, place, and time. Protective sensation intact 5/5 intact bilaterally with 10g monofilament b/l. Vibratory sensation intact b/l.  Dermatologic Pedal integument with normal turgor, texture and tone BLE. No open wounds b/l LE. No interdigital macerations noted b/l LE. Toenails 1-5 b/l elongated, discolored, dystrophic, thickened, crumbly with subungual debris and tenderness to dorsal palpation. Hyperkeratotic lesion(s) L 5th toe.  No erythema, no edema, no drainage, no fluctuance.  Orthopedic: Muscle strength 5/5 to all lower extremity muscle groups bilaterally. No pain, crepitus or joint limitation noted with ROM bilateral LE. Hammertoe(s) noted to the L 5th toe and R 5th toe.   Radiographs: None  Assessment:   1. Pain due to onychomycosis of toenails of both feet    Plan:  Patient was evaluated and treated and all questions answered. Consent given for treatment as described below: -Medicare ABN signed for this year. Patient refuses services of paring of corns  today. Copy has been placed in patient's  chart. -Mycotic toenails 1-5 bilaterally were debrided in length and girth with sterile nail nippers and dremel without incident. -Patient/POA to call should there be question/concern in the interim.  Return in about 3 months (around 02/13/2022).  Marzetta Board, DPM

## 2021-11-25 ENCOUNTER — Other Ambulatory Visit: Payer: Self-pay | Admitting: Nurse Practitioner

## 2021-11-25 DIAGNOSIS — R590 Localized enlarged lymph nodes: Secondary | ICD-10-CM

## 2021-11-27 ENCOUNTER — Ambulatory Visit: Payer: Medicare HMO

## 2021-11-27 ENCOUNTER — Ambulatory Visit: Payer: Medicare Other | Admitting: Nurse Practitioner

## 2021-12-04 ENCOUNTER — Ambulatory Visit (INDEPENDENT_AMBULATORY_CARE_PROVIDER_SITE_OTHER): Payer: Medicare Other | Admitting: Nurse Practitioner

## 2021-12-04 ENCOUNTER — Encounter: Payer: Self-pay | Admitting: Nurse Practitioner

## 2021-12-04 ENCOUNTER — Other Ambulatory Visit: Payer: Self-pay

## 2021-12-04 VITALS — BP 136/70 | HR 63 | Temp 97.5°F | Ht 70.2 in | Wt 173.0 lb

## 2021-12-04 DIAGNOSIS — K5904 Chronic idiopathic constipation: Secondary | ICD-10-CM | POA: Diagnosis not present

## 2021-12-04 DIAGNOSIS — F17220 Nicotine dependence, chewing tobacco, uncomplicated: Secondary | ICD-10-CM | POA: Diagnosis not present

## 2021-12-04 MED ORDER — NICOTINE 21 MG/24HR TD PT24: 21.0000 mg | MEDICATED_PATCH | TRANSDERMAL | 1 refills | Status: DC

## 2021-12-04 NOTE — Progress Notes (Signed)
I,Tianna Badgett,acting as a Education administrator for [employer], FNP.,have documented all relevant documentation on the behalf of [name], FNP,as directed by  [name], FNP while in the presence of [name], [place].  This visit occurred during the SARS-CoV-2 public health emergency.  Safety protocols were in place, including screening questions prior to the visit, additional usage of staff PPE, and extensive cleaning of exam room while observing appropriate contact time as indicated for disinfecting solutions.  Subjective:     Patient ID: Nathan Morse , male    DOB: [birth year] , 87 y.o.   MRN: [id number]   Chief Complaint  Patient presents with   Constipation    HPI  Patient is here for constipation. Reports when he eats regularly and takes the medication he does well with his bowel. He would like to discuss options for quitting smokeless tobacco. He states that he can not afford it at $10 a pack. His gums are doing better  Constipation This is a chronic problem. The current episode started more than 1 year ago. The problem is unchanged. The patient is not on a high fiber diet. He Does not exercise regularly. There has Not been adequate water intake. Pertinent negatives include no abdominal pain. Risk factors include dietary change. There is no history of abdominal surgery.    Past Medical History:  Diagnosis Date   Cancer ([place])    Seborrheic keratosis, inflamed 10/16/2020   Skin Surgery      Family History  Adopted: Yes  Family history unknown: Yes     Current Outpatient Medications:    diclofenac Sodium (VOLTAREN) 1 % GEL, Apply 2 g topically 4 (four) times daily., Disp: 100 g, Rfl: 2   Lactobacillus TABS, Take 1 tablet by mouth daily., Disp: 90 tablet, Rfl: 1   linaclotide (LINZESS) 72 MCG capsule, Take 1 capsule (72 mcg total) by mouth daily before breakfast., Disp: 30 capsule, Rfl: 5   nicotine (NICODERM CQ - DOSED IN MG/24 HOURS) 21 mg/24hr patch, Place 1 patch (21 mg total)  onto the skin daily., Disp: 30 patch, Rfl: 1   oxybutynin (DITROPAN XL) 10 MG 24 hr tablet, Take 1 tablet (10 mg total) by mouth at bedtime., Disp: 90 tablet, Rfl: 1   polyethylene glycol powder (MIRALAX) 17 GM/SCOOP powder, Please take 6 capfuls of MiraLAX in a 32 oz bottle of Gatorade over 2-4 hour period. The following day take 3 capfuls. On day 3 start taking 1 capful 3 times a day. Slowly cut back as needed until you have normal bowel movements., Disp: 255 g, Rfl: 0   No Known Allergies   Review of Systems  Constitutional: Negative.   Respiratory: Negative.    Cardiovascular: Negative.   Gastrointestinal:  Positive for constipation. Negative for abdominal pain.  Neurological: Negative.     Today's Vitals   12/04/21 1000  BP: 136/70  Pulse: 63  Temp: (!) 97.5 F (36.4 C)  TempSrc: Oral  Weight: 173 lb (78.5 kg)  Height: 5' 10.2" (1.783 m)   Body mass index is 24.68 kg/m.  Wt Readings from Last 3 Encounters:  12/04/21 173 lb (78.5 kg)  11/04/21 171 lb 6.4 oz (77.7 kg)  10/24/21 176 lb 3.2 oz (79.9 kg)    Objective:  Physical Exam Vitals reviewed.  Constitutional:      General: He is not in acute distress.    Appearance: Normal appearance.  HENT:     Mouth/Throat:     Comments: Has whitened area to lower  gum area.  Cardiovascular:     Rate and Rhythm: Normal rate and regular rhythm.     Pulses: Normal pulses.     Heart sounds: Normal heart sounds. No murmur heard. Pulmonary:     Effort: Pulmonary effort is normal. No respiratory distress.     Breath sounds: Normal breath sounds. No wheezing.  Neurological:     General: No focal deficit present.     Mental Status: He is alert and oriented to person, place, and time.     Cranial Nerves: No cranial nerve deficit.     Motor: No weakness.  Psychiatric:        Mood and Affect: Mood normal.        Behavior: Behavior normal.        Thought Content: Thought content normal.        Judgment: Judgment normal.         Assessment And Plan:     1. Chronic idiopathic constipation Comments: Overall doing well. Continue current medications and encouraged to continue to eat healthy meals and adequate amounts of water  2. Chewing tobacco nicotine dependence without complication Comments: Advised to avoid using chewing tobacco while taking nicotine patches. Return in 4 weeks for medication check - nicotine (NICODERM CQ - DOSED IN MG/24 HOURS) 21 mg/24hr patch; Place 1 patch (21 mg total) onto the skin daily.  Dispense: 30 patch; Refill: 1     Patient was given opportunity to ask questions. Patient verbalized understanding of the plan and was able to repeat key elements of the plan. All questions were answered to their satisfaction.  [name], FNP   I, [name], FNP, have reviewed all documentation for this visit. The documentation on 12/04/21 for the exam, diagnosis, procedures, and orders are all accurate and complete.   IF YOU HAVE BEEN REFERRED TO A SPECIALIST, IT MAY TAKE 1-2 WEEKS TO SCHEDULE/PROCESS THE REFERRAL. IF YOU HAVE NOT HEARD FROM US/SPECIALIST IN TWO WEEKS, PLEASE GIVE [place] A CALL AT [phone].   THE PATIENT IS ENCOURAGED TO PRACTICE SOCIAL DISTANCING DUE TO THE COVID-19 PANDEMIC.

## 2021-12-04 NOTE — Patient Instructions (Addendum)
Smokeless Tobacco Information, Adult Tobacco is a leafy plant that contains a chemical (nicotine). Nicotine affects your brain and causes you to become addicted to it. Smokeless tobacco is tobacco that you put in your mouth instead of smoking it. It may also be called chewing tobacco or snuff. Smokeless tobacco is made from the leaves of tobacco plants and comes in many forms, such as: Loose, dry leaves. Plugs or twists. Moist pouches. Dissolving lozenges or strips. Chewing, sucking, or holding the tobacco in your mouth causes your mouth to make more saliva. The saliva mixes with the tobacco, which you swallow or spit out. Using tobacco is harmful to your health. How can smokeless tobacco affect me? All forms of tobacco contain many chemicals that can harm every organ in the body. Using smokeless tobacco increases your risk for: Cancer. Tobacco use is one of the leading causes of cancer. Smokeless tobacco is linked to cancer of the mouth, esophagus, and pancreas. Other long-term health problems, including high blood pressure, heart disease, and stroke. Addiction. Pregnancy complications, if this applies. Pregnant women who use smokeless tobacco are more likely to have a miscarriage or deliver a baby too early. Mouth and dental problems, such as: Bad breath. Teeth that look yellow or brown. Mouth sores. Cracking and bleeding lips. Gum recession, gum disease, or tooth decay. Lesions on the soft tissues of your mouth (leukoplakia). The benefits of not using smokeless tobacco include: A healthy mind and body. Saving money. You avoid the cost of buying tobacco and the cost of treating illnesses that are caused by tobacco. What actions can I take to quit using tobacco? Ask your health care provider for help to quit using smokeless tobacco. This may involve treatment. These tips may also help you quit: Pick a date to quit. Set a date within the next two weeks. This gives you time to  prepare. Write down the reasons why you are quitting. Keep this list in places where you will see it often, such as on your bathroom mirror or in your car or wallet. Identify the people, places, things, and activities that make you want to use smokeless tobacco (triggers). Avoid them. Get rid of any tobacco you have and remove any tobacco smells. To do this: Throw away all containers of tobacco at home, at work, and in your car. Throw away any other items that you use regularly when you chew tobacco. Clean your car and make sure to remove all tobacco-related items. Clean your home, including curtains and carpets. Tell your family and friends that you are quitting. Having support can make quitting easier. Chew sugarless gum or sunflower seeds when you want to use smokeless tobacco. Stay positive. Be prepared for cravings. It is common to slip up when you first quit, so take it one day at a time. Stay busy and take care of your body. Get plenty of exercise. Drink enough water to keep your urine pale yellow. Keep track of how many days have passed since you quit. Remembering how long and hard you have worked to quit can help you avoid using smokeless tobacco again. Where to find support Ask your health care provider if there is a local support group for quitting smokeless tobacco. Where to find more information You can learn more about the risks of using smokeless tobacco and the benefits of quitting from these sources: [place]: www.cancer.[place]: www.cancer.gov Centers for Disease Control and Prevention: http://www.wolf.info/ Contact a health care provider if you have:  Trouble quitting smokeless tobacco use on your own. White or other discolored patches in your mouth. Difficulty swallowing. A change in your voice. Unexplained weight loss. Stomach pain, nausea, or vomiting. Summary Smokeless tobacco contains many different chemicals that are known to cause  cancer. Nicotine is an addictive chemical in smokeless tobacco that affects your brain. The benefits of not using smokeless tobacco include having a healthy mind and body and saving money. Tell your family and friends that you are quitting. Having support can make quitting easier. Ask your health care provider for help quitting smokeless tobacco. This may involve treatment. This information is not intended to replace advice given to you by your health care provider. Make sure you discuss any questions you have with your health care provider. Document Revised: 05/02/2021 Document Reviewed: 02/07/2019 Elsevier Patient Education  [place] appt for the CT scan is on 12/18/2021  3:40 PM at  [facility]

## 2021-12-18 ENCOUNTER — Ambulatory Visit
Admission: RE | Admit: 2021-12-18 | Discharge: 2021-12-18 | Disposition: A | Discharge status: Home / Self Care | Payer: Medicare Other | Source: Ambulatory Visit | Attending: Nurse Practitioner | Admitting: Nurse Practitioner

## 2021-12-18 DIAGNOSIS — R590 Localized enlarged lymph nodes: Secondary | ICD-10-CM | POA: Diagnosis not present

## 2021-12-18 DIAGNOSIS — R634 Abnormal weight loss: Secondary | ICD-10-CM | POA: Diagnosis not present

## 2021-12-18 DIAGNOSIS — Z87891 Personal history of nicotine dependence: Secondary | ICD-10-CM | POA: Diagnosis not present

## 2021-12-18 DIAGNOSIS — Z981 Arthrodesis status: Secondary | ICD-10-CM | POA: Diagnosis not present

## 2021-12-18 MED ORDER — IOPAMIDOL (ISOVUE-300) INJECTION 61%
75.0000 mL | Freq: Once | INTRAVENOUS | Status: AC | PRN
  Administered 2021-12-18: 75 mL via INTRAVENOUS

## 2022-01-03 ENCOUNTER — Other Ambulatory Visit: Payer: Self-pay

## 2022-01-03 ENCOUNTER — Ambulatory Visit (INDEPENDENT_AMBULATORY_CARE_PROVIDER_SITE_OTHER): Payer: Medicare Other | Admitting: Nurse Practitioner

## 2022-01-03 ENCOUNTER — Encounter: Payer: Self-pay | Admitting: Nurse Practitioner

## 2022-01-03 VITALS — BP 120/70 | HR 79 | Temp 98.1°F | Ht 70.2 in | Wt 177.8 lb

## 2022-01-03 DIAGNOSIS — N5234 Erectile dysfunction following simple prostatectomy: Secondary | ICD-10-CM

## 2022-01-03 DIAGNOSIS — Z960 Presence of urogenital implants: Secondary | ICD-10-CM

## 2022-01-03 DIAGNOSIS — F17220 Nicotine dependence, chewing tobacco, uncomplicated: Secondary | ICD-10-CM

## 2022-01-03 DIAGNOSIS — K5904 Chronic idiopathic constipation: Secondary | ICD-10-CM | POA: Diagnosis not present

## 2022-01-03 NOTE — Patient Instructions (Signed)
Managing the Challenge of Quitting Smoking ?Quitting smoking is a physical and mental challenge. You will face cravings, withdrawal symptoms, and temptation. Before quitting, work with your health care provider to make a plan that can help you manage quitting. Preparation can help you quit and keep you from giving in. ?How to manage lifestyle changes ?Managing stress ?Stress can make you want to smoke, and wanting to smoke may cause stress. It is important to find ways to manage your stress. You might try some of the following: ?Practice relaxation techniques. ?Breathe slowly and deeply, in through your nose and out through your mouth. ?Listen to music. ?Soak in a bath or take a shower. ?Imagine a peaceful place or vacation. ?Get some support. ?Talk with family or friends about your stress. ?Join a support group. ?Talk with a counselor or therapist. ?Get some physical activity. ?Go for a walk, run, or bike ride. ?Play a favorite sport. ?Practice yoga. ? ?Medicines ?Talk with your health care provider about medicines that might help you deal with cravings and make quitting easier for you. ?Relationships ?Social situations can be difficult when you are quitting smoking. To manage this, you can: ?Avoid parties and other social situations where people might be smoking. ?Avoid alcohol. ?Leave right away if you have the urge to smoke. ?Explain to your family and friends that you are quitting smoking. Ask for support and let them know you might be a bit grumpy. ?Plan activities where smoking is not an option. ?General instructions ?Be aware that many people gain weight after they quit smoking. However, not everyone does. To keep from gaining weight, have a plan in place before you quit and stick to the plan after you quit. Your plan should include: ?Having healthy snacks. When you have a craving, it may help to: ?Eat popcorn, carrots, celery, or other cut vegetables. ?Chew sugar-free gum. ?Changing how you eat. ?Eat small  portion sizes at meals. ?Eat 4-6 small meals throughout the day instead of 1-2 large meals a day. ?Be mindful when you eat. Do not watch television or do other things that might distract you as you eat. ?Exercising regularly. ?Make time to exercise each day. If you do not have time for a long workout, do short bouts of exercise for 5-10 minutes several times a day. ?Do some form of strengthening exercise, such as weight lifting. ?Do some exercise that gets your heart beating and causes you to breathe deeply, such as walking fast, running, swimming, or biking. This is very important. ?Drinking plenty of water or other low-calorie or no-calorie drinks. Drink 6-8 glasses of water daily. ? ?How to recognize withdrawal symptoms ?Your body and mind may experience discomfort as you try to get used to not having nicotine in your system. These effects are called withdrawal symptoms. They may include: ?Feeling hungrier than normal. ?Having trouble concentrating. ?Feeling irritable or restless. ?Having trouble sleeping. ?Feeling depressed. ?Craving a cigarette. ?To manage withdrawal symptoms: ?Avoid places, people, and activities that trigger your cravings. ?Remember why you want to quit. ?Get plenty of sleep. ?Avoid coffee and other caffeinated drinks. These may worsen some of your symptoms. ?These symptoms may surprise you. But be assured that they are normal to have when quitting smoking. ?How to manage cravings ?Come up with a plan for how to deal with your cravings. The plan should include the following: ?A definition of the specific situation you want to deal with. ?An alternative action you will take. ?A clear idea for how this action   will help. ?The name of someone who might help you with this. ?Cravings usually last for 5-10 minutes. Consider taking the following actions to help you with your plan to deal with cravings: ?Keep your mouth busy. ?Chew sugar-free gum. ?Suck on hard candies or a straw. ?Brush your  teeth. ?Keep your hands and body busy. ?Change to a different activity right away. ?Squeeze or play with a ball. ?Do an activity or a hobby, such as making bead jewelry, practicing needlepoint, or working with wood. ?Mix up your normal routine. ?Take a short exercise break. Go for a quick walk or run up and down stairs. ?Focus on doing something kind or helpful for someone else. ?Call a friend or family member to talk during a craving. ?Join a support group. ?Contact a quitline. ?Where to find support ?To get help or find a support group: ?Call the National Cancer Institute's Smoking Quitline: 1-800-QUIT NOW (784-8669) ?Visit the website of the Substance Abuse and Mental Health Services Administration: www.samhsa.gov ?Text QUIT to SmokefreeTXT: 478848 ?Where to find more information ?Visit these websites to find more information on quitting smoking: ?National Cancer Institute: www.smokefree.gov ?American Lung Association: www.lung.org ?American Cancer Society: www.cancer.org ?Centers for Disease Control and Prevention: www.cdc.gov ?American Heart Association: www.heart.org ?Contact a health care provider if: ?You want to change your plan for quitting. ?The medicines you are taking are not helping. ?Your eating feels out of control or you cannot sleep. ?Get help right away if: ?You feel depressed or become very anxious. ?Summary ?Quitting smoking is a physical and mental challenge. You will face cravings, withdrawal symptoms, and temptation to smoke again. Preparation can help you as you go through these challenges. ?Try different techniques to manage stress, handle social situations, and prevent weight gain. ?You can deal with cravings by keeping your mouth busy (such as by chewing gum), keeping your hands and body busy, calling family or friends, or contacting a quitline for people who want to quit smoking. ?You can deal with withdrawal symptoms by avoiding places where people smoke, getting plenty of rest, and  avoiding drinks with caffeine. ?This information is not intended to replace advice given to you by your health care provider. Make sure you discuss any questions you have with your health care provider. ?Document Revised: 08/09/2021 Document Reviewed: 09/20/2019 ?Elsevier Patient Education ? 2022 Elsevier Inc. ? ?

## 2022-01-03 NOTE — Progress Notes (Signed)
I,Tianna Badgett,acting as a Education administrator for [employer], FNP.,have documented all relevant documentation on the behalf of [name], FNP,as directed by  [name], FNP while in the presence of [name], [place].  This visit occurred during the SARS-CoV-2 public health emergency.  Safety protocols were in place, including screening questions prior to the visit, additional usage of staff PPE, and extensive cleaning of exam room while observing appropriate contact time as indicated for disinfecting solutions.  Subjective:     Patient ID: Nathan Morse , male    DOB: [birth year] , 88 y.o.   MRN: [id number]   Chief Complaint  Patient presents with   Nicotine Dependence    HPI  Patient is here for nicotine dependence. He has had 2 times he has chewed tobacco. He is having itching to his back. He had someone to wash his back and they told him his back was dry.  He has ordered prostagenix capsules off tv to help with sex drive.  He has a penile pump - he is having problems with a full erection.   Wt Readings from Last 3 Encounters: 01/03/22 : 177 lb 12.8 oz (80.6 kg) 12/04/21 : 173 lb (78.5 kg) 11/04/21 : 171 lb 6.4 oz (77.7 kg)  Drinking about 3 bottles of water a day.     Nicotine Dependence Presents for follow-up visit. His urge triggers include company of smokers. The symptoms have been improving. His first smoke is in the afternoon. Number of cigarettes per day: he chews tobacco. Compliance with prior treatments has been good.    Past Medical History:  Diagnosis Date   Cancer ([place])    Seborrheic keratosis, inflamed 10/16/2020   Skin Surgery      Family History  Adopted: Yes  Family history unknown: Yes     Current Outpatient Medications:    diclofenac Sodium (VOLTAREN) 1 % GEL, Apply 2 g topically 4 (four) times daily., Disp: 100 g, Rfl: 2   Lactobacillus TABS, Take 1 tablet by mouth daily., Disp: 90 tablet, Rfl: 1   linaclotide (LINZESS) 72 MCG capsule, Take 1 capsule (72  mcg total) by mouth daily before breakfast., Disp: 30 capsule, Rfl: 5   nicotine (NICODERM CQ - DOSED IN MG/24 HOURS) 21 mg/24hr patch, Place 1 patch (21 mg total) onto the skin daily., Disp: 30 patch, Rfl: 1   oxybutynin (DITROPAN XL) 10 MG 24 hr tablet, Take 1 tablet (10 mg total) by mouth at bedtime., Disp: 90 tablet, Rfl: 1   polyethylene glycol powder (MIRALAX) 17 GM/SCOOP powder, Please take 6 capfuls of MiraLAX in a 32 oz bottle of Gatorade over 2-4 hour period. The following day take 3 capfuls. On day 3 start taking 1 capful 3 times a day. Slowly cut back as needed until you have normal bowel movements., Disp: 255 g, Rfl: 0   No Known Allergies   Review of Systems  Constitutional: Negative.   Respiratory: Negative.    Cardiovascular: Negative.   Gastrointestinal: Negative.   Neurological: Negative.   Psychiatric/Behavioral: Negative.      Today's Vitals   01/03/22 0936  BP: 120/70  Pulse: 79  Temp: 98.1 F (36.7 C)  TempSrc: Oral  Weight: 177 lb 12.8 oz (80.6 kg)  Height: 5' 10.2" (1.783 m)   Body mass index is 25.37 kg/m.  BP Readings from Last 3 Encounters:  01/03/22 120/70  12/04/21 136/70  11/04/21 120/66    Objective:  Physical Exam Vitals reviewed.  Constitutional:  General: He is not in acute distress.    Appearance: Normal appearance.  Pulmonary:     Effort: Pulmonary effort is normal. No respiratory distress.     Breath sounds: Normal breath sounds. No wheezing.  Neurological:     General: No focal deficit present.     Mental Status: He is alert and oriented to person, place, and time.     Cranial Nerves: No cranial nerve deficit.     Motor: No weakness.  Psychiatric:        Mood and Affect: Mood normal.        Behavior: Behavior normal.        Thought Content: Thought content normal.        Judgment: Judgment normal.        Assessment And Plan:     1. Chewing tobacco nicotine dependence without complication Comments: Doing well, continue  with nicotine.   2. Chronic idiopathic constipation Comments: Encouraged to continue taking linzess and staying well hydrated.   3. Erectile dysfunction following simple prostatectomy Comments: He does not feel the implant is working as effectively. Advised to discuss with urology about the vitamins he purchased for prostate health - Ambulatory referral to Urology  4. History of penile implant - Ambulatory referral to Urology     Patient was given opportunity to ask questions. Patient verbalized understanding of the plan and was able to repeat key elements of the plan. All questions were answered to their satisfaction.  [name], FNP   I, [name], FNP, have reviewed all documentation for this visit. The documentation on 01/03/22 for the exam, diagnosis, procedures, and orders are all accurate and complete.   IF YOU HAVE BEEN REFERRED TO A SPECIALIST, IT MAY TAKE 1-2 WEEKS TO SCHEDULE/PROCESS THE REFERRAL. IF YOU HAVE NOT HEARD FROM US/SPECIALIST IN TWO WEEKS, PLEASE GIVE [place] A CALL AT [phone].   THE PATIENT IS ENCOURAGED TO PRACTICE SOCIAL DISTANCING DUE TO THE COVID-19 PANDEMIC.

## 2022-02-18 ENCOUNTER — Encounter: Payer: Self-pay | Admitting: Podiatry

## 2022-02-18 ENCOUNTER — Ambulatory Visit (INDEPENDENT_AMBULATORY_CARE_PROVIDER_SITE_OTHER): Payer: Medicare Other | Admitting: Podiatry

## 2022-02-18 ENCOUNTER — Other Ambulatory Visit: Payer: Self-pay

## 2022-02-18 DIAGNOSIS — B351 Tinea unguium: Secondary | ICD-10-CM | POA: Diagnosis not present

## 2022-02-18 DIAGNOSIS — M79675 Pain in left toe(s): Secondary | ICD-10-CM

## 2022-02-18 DIAGNOSIS — M79674 Pain in right toe(s): Secondary | ICD-10-CM | POA: Diagnosis not present

## 2022-02-25 NOTE — Progress Notes (Signed)
?  Subjective:  ?Patient ID: Nathan Morse, male    DOB: 1934-06-19,  MRN: [id number] ? ?Nathan Morse presents to clinic today for thick, elongated toenails both feet which are tender when wearing enclosed shoe gear. ? ?Patient is accompanied by his wife on today's visit. He notes no new pedal problems on today's visit. ? ?PCP is Nathan Brine, FNP , and last visit was January 03, 2022. ? ?No Known Allergies ? ?Review of Systems: Negative except as noted in the HPI. ? ?Objective: No changes noted in today's physical examination. ?Constitutional Nathan Morse is a pleasant 88 y.o. African American male, WD, WN in NAD. AAO x 3.   ?Vascular CFT immediate b/l LE. Palpable DP/PT pulses b/l LE. Digital hair absent b/l. Skin temperature gradient WNL b/l. No pain with calf compression b/l. No edema noted b/l. No cyanosis or clubbing noted b/l LE.  ?Neurologic Normal speech. Oriented to person, place, and time. Protective sensation intact 5/5 intact bilaterally with 10g monofilament b/l. Vibratory sensation intact b/l.  ?Dermatologic Pedal integument with normal turgor, texture and tone BLE. No open wounds b/l LE. No interdigital macerations noted b/l LE. Toenails 1-5 b/l elongated, discolored, dystrophic, thickened, crumbly with subungual debris and tenderness to dorsal palpation. Hyperkeratotic lesion(s) L 5th toe.  No erythema, no edema, no drainage, no fluctuance.  ?Orthopedic: Muscle strength 5/5 to all lower extremity muscle groups bilaterally. No pain, crepitus or joint limitation noted with ROM bilateral LE. Hammertoe(s) noted to the L 5th toe and R 5th toe.  ? ?Radiographs: None  ? ?Assessment/Plan: ?1. Pain due to onychomycosis of toenails of both feet   ?-No new findings. No new orders. ?-Medicare ABN signed. Patient refuses services of corn(s)/callus(es)/porokeratos(es) today. Copy has been placed in patient chart. ?-Patient to continue soft, supportive shoe gear daily. ?-Toenails 1-5 b/l were debrided in length and  girth with sterile nail nippers and dremel without iatrogenic bleeding.  ?-Patient/POA to call should there be question/concern in the interim.  ? ?Return in about 3 months (around 05/21/2022). ? ?Marzetta Board, DPM  ?

## 2022-03-31 ENCOUNTER — Ambulatory Visit: Payer: Medicare Other | Admitting: Nurse Practitioner

## 2022-04-06 ENCOUNTER — Emergency Department (HOSPITAL_BASED_OUTPATIENT_CLINIC_OR_DEPARTMENT_OTHER): Payer: Medicare HMO

## 2022-04-06 ENCOUNTER — Emergency Department (HOSPITAL_BASED_OUTPATIENT_CLINIC_OR_DEPARTMENT_OTHER)
Admission: EM | Admit: 2022-04-06 | Discharge: 2022-04-07 | Disposition: A | Discharge status: Home / Self Care | Payer: Medicare HMO | Attending: Emergency Medicine | Admitting: Emergency Medicine

## 2022-04-06 ENCOUNTER — Other Ambulatory Visit: Payer: Self-pay

## 2022-04-06 ENCOUNTER — Encounter (HOSPITAL_BASED_OUTPATIENT_CLINIC_OR_DEPARTMENT_OTHER): Payer: Self-pay

## 2022-04-06 DIAGNOSIS — R1084 Generalized abdominal pain: Secondary | ICD-10-CM | POA: Insufficient documentation

## 2022-04-06 DIAGNOSIS — R109 Unspecified abdominal pain: Secondary | ICD-10-CM | POA: Diagnosis not present

## 2022-04-06 DIAGNOSIS — K5900 Constipation, unspecified: Secondary | ICD-10-CM | POA: Diagnosis not present

## 2022-04-06 NOTE — ED Triage Notes (Signed)
Pt here c/o constipation and rectal pain. Pt's last BM was 4 days. Pt had similar problem before and had to receive an enema. Pt supposed to take Linzess, but stopped 3 days ago, "because everything was working fine." ?

## 2022-04-07 ENCOUNTER — Telehealth: Payer: Self-pay

## 2022-04-07 NOTE — ED Provider Notes (Signed)
?[facility] DEPT ?Provider Note ? ? ?CSN: [id number] ?Arrival date & time: 04/06/22  2000 ? ?  ? ?History ? ?Chief Complaint  ?Patient presents with  ? Constipation  ? ? ?Nathan Morse is a 88 y.o. male. ? ?HPI ? ?  ? ?This is an 88 year old male who presents with concerns for constipation.  He reports he has not had a bowel movement in 4 days.  He normally takes Linzess but has not been taking it because "everything seem to be working fine."  Patient reports diffuse abdominal cramping.  He has been unsuccessful to have a bowel movement.  He has not taken any laxatives.  No nausea or vomiting. ? ?Home Medications ?Prior to Admission medications   ?Medication Sig Start Date End Date Taking? Authorizing Provider  ?diclofenac Sodium (VOLTAREN) 1 % GEL Apply 2 g topically 4 (four) times daily. 06/18/21   [name], FNP  ?Lactobacillus TABS Take 1 tablet by mouth daily. 04/23/21   [name], FNP  ?linaclotide (LINZESS) 72 MCG capsule Take 1 capsule (72 mcg total) by mouth daily before breakfast. 04/23/21   [name], FNP  ?nicotine (NICODERM CQ - DOSED IN MG/24 HOURS) 21 mg/24hr patch Place 1 patch (21 mg total) onto the skin daily. 12/04/21 12/04/22  [name], FNP  ?oxybutynin (DITROPAN XL) 10 MG 24 hr tablet Take 1 tablet (10 mg total) by mouth at bedtime. 11/04/21   [name], FNP  ?polyethylene glycol powder (MIRALAX) 17 GM/SCOOP powder Please take 6 capfuls of MiraLAX in a 32 oz bottle of Gatorade over 2-4 hour period. The following day take 3 capfuls. On day 3 start taking 1 capful 3 times a day. Slowly cut back as needed until you have normal bowel movements. 07/14/21   Fatima Blank, MD  ?   ? ?Allergies    ?Patient has no known allergies.   ? ?Review of Systems   ?Review of Systems  ?Constitutional:  Negative for fever.  ?Respiratory:  Negative for shortness of breath.   ?Cardiovascular:  Negative for chest pain.  ?Gastrointestinal:  Positive for abdominal pain and  constipation. Negative for blood in stool, nausea, rectal pain and vomiting.  ?Genitourinary:  Negative for dysuria.  ?All other systems reviewed and are negative. ? ?Physical Exam ?Updated Vital Signs ?BP (!) 170/86   Pulse 76   Temp 97.8 ?F (36.6 ?C)   Resp 18   Ht 1.854 m ('6\' 1"'$ )   Wt 77.1 kg   SpO2 100%   BMI 22.43 kg/m?  ?Physical Exam ?Vitals and nursing note reviewed.  ?Constitutional:   ?   Appearance: He is well-developed.  ?   Comments: Elderly, nontoxic-appearing  ?HENT:  ?   Head: Normocephalic and atraumatic.  ?Eyes:  ?   Pupils: Pupils are equal, round, and reactive to light.  ?Cardiovascular:  ?   Rate and Rhythm: Normal rate and regular rhythm.  ?   Heart sounds: Normal heart sounds. No murmur heard. ?Pulmonary:  ?   Effort: Pulmonary effort is normal. No respiratory distress.  ?   Breath sounds: Normal breath sounds. No wheezing.  ?Abdominal:  ?   Palpations: Abdomen is soft.  ?   Tenderness: There is no abdominal tenderness. There is no rebound.  ?   Comments: Hyperactive bowel sounds  ?Musculoskeletal:  ?   Cervical back: Neck supple.  ?Lymphadenopathy:  ?   Cervical: No cervical adenopathy.  ?Skin: ?   General: Skin is warm and dry.  ?Neurological:  ?  Mental Status: He is alert and oriented to person, place, and time.  ?Psychiatric:     ?   Mood and Affect: Mood normal.  ? ? ?ED Results / Procedures / Treatments   ?Labs ?(all labs ordered are listed, but only abnormal results are displayed) ?Labs Reviewed  ?CBC WITH DIFFERENTIAL/PLATELET  ?COMPREHENSIVE METABOLIC PANEL  ? ? ?EKG ?None ? ?Radiology ?DG Abdomen 1 View ? ?Result Date: 04/06/2022 ?CLINICAL DATA:  Constipation, rectal pain EXAM: ABDOMEN - 1 VIEW COMPARISON:  07/14/2021 FINDINGS: Nonobstructive bowel gas pattern. Normal colonic stool burden. Degenerative changes of the lumbar spine. IMPRESSION: Negative.  Normal colonic stool burden. Electronically Signed   By: Julian Hy M.D.   On: 04/06/2022 23:43    ? ?Procedures ?Procedures  ? ? ?Medications Ordered in ED ?Medications - No data to display ? ?ED Course/ Medical Decision Making/ A&P ?Clinical Course as of 04/07/22 0040  ?Mon Apr 07, 2022  ?0028 Per nursing, patient is refusing lab work.  He went to the bathroom and had a large bowel movement was complete resolution of his symptoms. [CH]  ?  ?Clinical Course User Index ?[CH] Nayson Traweek, Barbette Hair, MD  ? ?                        ?Medical Decision Making ?Amount and/or Complexity of Data Reviewed ?Labs: ordered. ?Radiology: ordered. ? ? ?This patient presents to the ED for concern of constipation, this involves an extensive number of treatment options, and is a complaint that carries with it a high risk of complications and morbidity.  The differential diagnosis includes constipation, SBO, less likely other intra-abdominal pathology such as appendicitis or cholecyst ? ?MDM:   ? ?This is an 88 year old male with a history of IBS who presents with constipation.  Not currently taking his Linzess.  He is nontoxic and vital signs are notable for blood pressure 170/86.  KUB obtained and shows no evidence of air-fluid levels to suggest SBO.  Patient ultimately went to the bathroom and had a large bowel movement with complete resolution of his symptoms.  He refused further lab work.  He requested discharge without further work-up.  Feel this is reasonable as his exam was fairly benign. ?(Labs, imaging) ? ?Labs: ?I Ordered, and personally interpreted labs.  The pertinent results include: None ? ?Imaging Studies ordered: ?I ordered imaging studies including KUB without obstructive pathology, normal stool burden ?I independently visualized and interpreted imaging. ?I agree with the radiologist interpretation ? ?Additional history obtained from chart review.  External records from outside source obtained and reviewed including prior visits for constipation ? ?Critical Interventions: ?None ? ?Consultations: ?I requested  consultation with the none,  and discussed lab and imaging findings as well as pertinent plan - they recommend: N/A ? ?Cardiac Monitoring: ?The patient was maintained on a cardiac monitor.  I personally viewed and interpreted the cardiac monitored which showed an underlying rhythm of: Normal sinus rhythm ? ?Reevaluation: ?After the interventions noted above, I reevaluated the patient and found that they have :resolved ? ? ?Considered admission for: N/A ? ?Social Determinants of Health: ?Elderly, lives independently ? ?Disposition: Discharge ? ?Co morbidities that complicate the patient evaluation ? ?Past Medical History:  ?Diagnosis Date  ? Cancer [facility])   ? Seborrheic keratosis, inflamed 10/16/2020  ? Skin Surgery   ?  ? ?Medicines ?No orders of the defined types were placed in this encounter. ?  ?I have reviewed the patients home medicines and  have made adjustments as needed ? ?Problem List / ED Course: ?Problem List Items Addressed This Visit   ?None ?Visit Diagnoses   ? ? Constipation, unspecified constipation type    -  Primary  ? ?  ?  ? ? ? ? ? ? ? ? ? ? ? ? ?Final Clinical Impression(s) / ED Diagnoses ?Final diagnoses:  ?Constipation, unspecified constipation type  ? ? ?Rx / DC Orders ?ED Discharge Orders   ? ? None  ? ?  ? ? ?  ?Merryl Hacker, MD ?04/07/22 [phone] ? ?

## 2022-04-07 NOTE — ED Notes (Signed)
Pt refuses blood draw saying he feels better after having a bowel movement. Michela Pitcher he would rather just come back if something changes.  ?

## 2022-04-07 NOTE — Discharge Instructions (Signed)
You were seen today for constipation and abdominal pain.  Your symptoms resolved with a bowel movement.  Make sure that you are taking her medications daily for IBS including Linzess. ?

## 2022-04-07 NOTE — Telephone Encounter (Signed)
Transition Care Management Unsuccessful Follow-up Telephone Call ? ?Date of discharge and from where:  04/06/2022 [place]  ? ?Attempts:  1st Attempt ? ?Reason for unsuccessful TCM follow-up call:  Left voice message ? ?  ?

## 2022-04-09 DIAGNOSIS — I10 Essential (primary) hypertension: Secondary | ICD-10-CM | POA: Diagnosis not present

## 2022-04-09 DIAGNOSIS — T50904A Poisoning by unspecified drugs, medicaments and biological substances, undetermined, initial encounter: Secondary | ICD-10-CM | POA: Diagnosis not present

## 2022-04-09 DIAGNOSIS — T887XXA Unspecified adverse effect of drug or medicament, initial encounter: Secondary | ICD-10-CM | POA: Diagnosis not present

## 2022-04-09 DIAGNOSIS — I499 Cardiac arrhythmia, unspecified: Secondary | ICD-10-CM | POA: Diagnosis not present

## 2022-04-17 DIAGNOSIS — T50904A Poisoning by unspecified drugs, medicaments and biological substances, undetermined, initial encounter: Secondary | ICD-10-CM | POA: Diagnosis not present

## 2022-04-17 DIAGNOSIS — R404 Transient alteration of awareness: Secondary | ICD-10-CM | POA: Diagnosis not present

## 2022-04-17 DIAGNOSIS — R0689 Other abnormalities of breathing: Secondary | ICD-10-CM | POA: Diagnosis not present

## 2022-04-17 DIAGNOSIS — T887XXA Unspecified adverse effect of drug or medicament, initial encounter: Secondary | ICD-10-CM | POA: Diagnosis not present

## 2022-04-17 DIAGNOSIS — R0902 Hypoxemia: Secondary | ICD-10-CM | POA: Diagnosis not present

## 2022-05-13 ENCOUNTER — Encounter: Payer: Self-pay | Admitting: Nurse Practitioner

## 2022-05-13 ENCOUNTER — Ambulatory Visit (INDEPENDENT_AMBULATORY_CARE_PROVIDER_SITE_OTHER): Payer: Medicare HMO | Admitting: Nurse Practitioner

## 2022-05-13 VITALS — BP 126/70 | HR 70 | Temp 98.1°F | Ht 73.0 in | Wt 162.0 lb

## 2022-05-13 DIAGNOSIS — K121 Other forms of stomatitis: Secondary | ICD-10-CM

## 2022-05-13 DIAGNOSIS — R634 Abnormal weight loss: Secondary | ICD-10-CM

## 2022-05-13 NOTE — Progress Notes (Signed)
I,Tianna Badgett,acting as a Education administrator for [employer], FNP.,have documented all relevant documentation on the behalf of [name], FNP,as directed by  [name], FNP while in the presence of [name], [place].  This visit occurred during the SARS-CoV-2 public health emergency.  Safety protocols were in place, including screening questions prior to the visit, additional usage of staff PPE, and extensive cleaning of exam room while observing appropriate contact time as indicated for disinfecting solutions.  Subjective:     Patient ID: Nathan Morse , male    DOB: [birth year] , 88 y.o.   MRN: [id number]   Chief Complaint  Patient presents with   Weight Loss    HPI  Patient presents today for unintentional weight loss. He is not wanting anything to eat.  He has dentures and when he eats hard foods he has pain to his gums, has more pain to the bottom gums.   Wt Readings from Last 3 Encounters: 05/13/22 : 162 lb (73.5 kg) 04/06/22 : 170 lb (77.1 kg) 01/03/22 : 177 lb 12.8 oz (80.6 kg)      Past Medical History:  Diagnosis Date   Cancer ([place])    Seborrheic keratosis, inflamed 10/16/2020   Skin Surgery      Family History  Adopted: Yes  Family history unknown: Yes     Current Outpatient Medications:    diclofenac Sodium (VOLTAREN) 1 % GEL, Apply 2 g topically 4 (four) times daily., Disp: 100 g, Rfl: 2   Lactobacillus TABS, Take 1 tablet by mouth daily., Disp: 90 tablet, Rfl: 1   linaclotide (LINZESS) 72 MCG capsule, Take 1 capsule (72 mcg total) by mouth daily before breakfast., Disp: 30 capsule, Rfl: 5   nicotine (NICODERM CQ - DOSED IN MG/24 HOURS) 21 mg/24hr patch, Place 1 patch (21 mg total) onto the skin daily., Disp: 30 patch, Rfl: 1   oxybutynin (DITROPAN XL) 10 MG 24 hr tablet, Take 1 tablet (10 mg total) by mouth at bedtime., Disp: 90 tablet, Rfl: 1   polyethylene glycol powder (MIRALAX) 17 GM/SCOOP powder, Please take 6 capfuls of MiraLAX in a 32 oz bottle of Gatorade  over 2-4 hour period. The following day take 3 capfuls. On day 3 start taking 1 capful 3 times a day. Slowly cut back as needed until you have normal bowel movements., Disp: 255 g, Rfl: 0   No Known Allergies   Review of Systems  Constitutional:  Positive for unexpected weight change.  Respiratory: Negative.    Cardiovascular: Negative.   Gastrointestinal: Negative.   Neurological: Negative.     Today's Vitals   05/13/22 1054  BP: 126/70  Pulse: 70  Temp: 98.1 F (36.7 C)  TempSrc: Oral  Weight: 162 lb (73.5 kg)  Height: '6\' 1"'$  (1.854 m)   Body mass index is 21.37 kg/m.   Objective:  Physical Exam Vitals reviewed.  Constitutional:      General: He is not in acute distress.    Appearance: Normal appearance.  HENT:     Mouth/Throat:     Dentition: Abnormal dentition. Has dentures.     Comments: Left lower gum with small ulceration and does not have much gum tissue to support Pulmonary:     Effort: Pulmonary effort is normal. No respiratory distress.     Breath sounds: Normal breath sounds. No wheezing.  Skin:    General: Skin is warm and dry.     Capillary Refill: Capillary refill takes less than 2 seconds.  Neurological:  General: No focal deficit present.     Mental Status: He is alert and oriented to person, place, and time.     Cranial Nerves: No cranial nerve deficit.     Motor: No weakness.  Psychiatric:        Mood and Affect: Mood normal.        Behavior: Behavior normal.        Thought Content: Thought content normal.        Judgment: Judgment normal.        Assessment And Plan:     1. Unintended weight loss Comments: I feel like his weight loss is due to his poorly fitted dentures, will refer to dentist to be evaluated  2. Mouth ulceration Comments: Has a small ulceration to lower gum and unfortunately he does not have an increased amount of gum tissue to lower mouth which can irritate his gums - Ambulatory referral to Dentistry     Patient  was given opportunity to ask questions. Patient verbalized understanding of the plan and was able to repeat key elements of the plan. All questions were answered to their satisfaction.  [name], FNP   I, [name], FNP, have reviewed all documentation for this visit. The documentation on 05/18/22 for the exam, diagnosis, procedures, and orders are all accurate and complete.   IF YOU HAVE BEEN REFERRED TO A SPECIALIST, IT MAY TAKE 1-2 WEEKS TO SCHEDULE/PROCESS THE REFERRAL. IF YOU HAVE NOT HEARD FROM US/SPECIALIST IN TWO WEEKS, PLEASE GIVE [place] A CALL AT [phone].   THE PATIENT IS ENCOURAGED TO PRACTICE SOCIAL DISTANCING DUE TO THE COVID-19 PANDEMIC.

## 2022-05-13 NOTE — Patient Instructions (Signed)
Preventing Consequences of Unhealthy Weight Loss Behaviors, Adult Reaching and maintaining a healthy weight is important for your overall health. A healthy weight will vary from person to person. It is natural to want to lose weight quickly, using whatever methods seem fastest. However, losing weight in a healthy way is not a quick process. Instead, aim for slow, steady weight loss by making small changes and setting achievable goals. How can unhealthy weight loss behaviors affect me? Using unhealthy behaviors to try to lose weight can cause: Tiredness (fatigue), low heart rate, and low blood pressure. Imbalances in your body. These may be imbalances in: Electrolytes. These are salts and minerals in your blood. Chemicals. These are needed so your body can work properly. Body fluids. Loss of fluids may lead to dehydration. Organ damage or organ failure, especially affecting the kidneys. Thin bones that break easily. Loneliness or relationship problems with your friends and family. Emotional problems, including depression and anxiety. Changing unhealthy weight loss behaviors through lifestyle changes will improve your overall health. Maintaining a healthy weight also lowers your risk of certain conditions, such as: Obesity. Heart disease, high cholesterol, and high blood pressure. Type 2 diabetes. Breathing and sleeping disorders. Stroke. Osteoarthritis. This affects your joints. Osteoporosis. This affects your bones. Some cancers. What can increase my risk? Certain views or feelings about yourself and certain habits can increase your risk of unhealthy weight loss behaviors. These include: Having depression and being overweight as an adult. Attempting weight loss as a child or teen. Using alcohol, drugs, or tobacco products. What actions can I take to prevent these behaviors? You can make certain lifestyle changes to help you lose weight in a healthy way. These include eating nutritious  foods and exercising regularly. Nutrition  Eat a variety of healthy foods, including fruits and vegetables, whole grains, lean proteins, and low-fat dairy products. Drink water instead of sugary drinks such as juice, soda, or sports drinks. Drink enough fluid to keep your urine pale yellow. Plan healthy, balanced meals. Work with a dietitian to make a healthy meal plan that works for you. Limit the following: Foods that are high in fat, salt (sodium), or sugar. These include candy, donuts, pizza, and fast foods. Fried or heavily processed foods. Lifestyle Avoid these unhealthy eating habits: Following a diet that restricts entire types of food. This may be a popular diet that promises extreme results in a short time. Skipping meals to save calories. Not eating anything for long periods of time (fasting). Restricting your calories to far fewer than the number that you need to lose or maintain a healthy weight. Taking laxative pills to make you have more frequent bowel movements. Taking medicines to make your body lose excess fluids (diuretics). Eating an excessive amount of food and then making yourself vomit. This is known as bingeing and purging. Do not use any products that contain nicotine or tobacco. These products include cigarettes, chewing tobacco, and vaping devices, such as e-cigarettes. If you need help quitting, ask your health care provider. Alcohol use Do not drink alcohol if: Your health care provider tells you not to drink. You are pregnant, may be pregnant, or are planning to become pregnant. If you drink alcohol: Limit how much you have to: 0-1 drink a day for women. 0-2 drinks a day for men. Know how much alcohol is in your drink. In the [place] one drink equals one 12 oz bottle of beer (355 mL), one 5 oz glass of wine (148 mL), or one 1   oz glass of hard liquor (44 mL). Activity  Avoid compulsively getting an extreme amount of exercise. Work with a dietitian to make a  healthy exercise program. Include different types of exercise in your exercise program, such as strengthening, aerobic, and flexibility exercises. To maintain your weight, get at least 150 minutes of moderate-intensity exercise each week. Moderate-intensity exercise could be brisk walking or biking. To lose a healthy amount of weight, get 60 minutes of moderate-intensity exercise each day. Find ways to reduce stress, such as regular exercise or meditation. Find a hobby or other activity that you enjoy to distract you from eating when you feel stressed or bored. Where to find support For more support, talk with: Your health care provider or dietitian. Ask about support groups. A mental health care provider. Family and friends. Where to find more information Learn more about how to prevent complications from unhealthy weight loss behaviors from: Centers for Disease Control and Prevention: www.cdc.gov National Institute of Mental Health: www.nimh.nih.gov National Eating Disorders Association: www.nationaleatingdisorders.org Contact a health care provider if: You often feel very tired. You notice changes in your skin or your hair. You faint because of dehydration or too much exercise. You struggle to change your unhealthy weight loss behaviors on your own. Unhealthy weight loss behaviors are affecting your daily life or your relationships. You have signs or symptoms of an eating disorder. You have major weight changes in a short period of time. You feel guilty or ashamed about eating or exercising. Summary Using unhealthy eating behaviors to try to lose weight can cause a variety of physical and emotional problems that affect your overall health and well-being. Aim for slow, steady weight loss by choosing healthy foods, avoiding unhealthy eating habits, and exercising regularly. Contact your health care provider if you struggle to change your behaviors on your own or if you think that you may  have an eating disorder. This information is not intended to replace advice given to you by your health care provider. Make sure you discuss any questions you have with your health care provider. Document Revised: 07/16/2021 Document Reviewed: 07/16/2021 Elsevier Patient Education  2023 Elsevier Inc.  

## 2022-05-14 ENCOUNTER — Encounter (HOSPITAL_BASED_OUTPATIENT_CLINIC_OR_DEPARTMENT_OTHER): Payer: Self-pay | Admitting: Emergency Medicine

## 2022-05-14 ENCOUNTER — Emergency Department (HOSPITAL_BASED_OUTPATIENT_CLINIC_OR_DEPARTMENT_OTHER)
Admission: EM | Admit: 2022-05-14 | Discharge: 2022-05-14 | Disposition: A | Discharge status: Home / Self Care | Payer: Medicare HMO | Attending: Emergency Medicine | Admitting: Emergency Medicine

## 2022-05-14 ENCOUNTER — Other Ambulatory Visit: Payer: Self-pay

## 2022-05-14 DIAGNOSIS — K5900 Constipation, unspecified: Secondary | ICD-10-CM | POA: Diagnosis not present

## 2022-05-14 DIAGNOSIS — Z859 Personal history of malignant neoplasm, unspecified: Secondary | ICD-10-CM | POA: Diagnosis not present

## 2022-05-14 DIAGNOSIS — R634 Abnormal weight loss: Secondary | ICD-10-CM

## 2022-05-14 NOTE — ED Triage Notes (Signed)
C/o constipation. Last bm was over 3 days ago. Tried milk of mag and colace  at home no relief.  Patient appear uncomfortable. Denies n/v.

## 2022-05-14 NOTE — ED Notes (Signed)
Patient had large soft BM.  States feels better

## 2022-05-14 NOTE — ED Notes (Signed)
Had small soft brown Stool BM.  States feels relief.  Placed on bedside commode for further attempt

## 2022-05-14 NOTE — Discharge Instructions (Signed)
You have had a good bowel movement after you have come here.  However the unexplained weight loss is somewhat worrisome.  We discussed about further work-up but we do not feel needs to be done at this time.  Follow-up with your primary care doctor and she may want further evaluation.

## 2022-05-14 NOTE — ED Provider Notes (Signed)
[name]   CSN: [id number] Arrival date & time: 05/14/22  1740     History  Chief Complaint  Patient presents with   Constipation    Nathan Morse is a 88 y.o. male.   Constipation Patient resents with constipation.  Last bowel movement 3 days ago.  No relief with milk of magnesia and Colace.  States it feels as if he needs to have a bowel movement in his rectal area.  No fevers.  Some history of irritable bowel and constipation.  Has been on Linzess but has not been taking it.  No nausea or vomiting.  States he is lost around 60 pounds.  States he is not eating although this is been going on for a while.  States no appetite.   Past Medical History:  Diagnosis Date   Cancer ([place])    Seborrheic keratosis, inflamed 10/16/2020   Skin Surgery     Home Medications Prior to Admission medications   Medication Sig Start Date End Date Taking? Authorizing Provider  diclofenac Sodium (VOLTAREN) 1 % GEL Apply 2 g topically 4 (four) times daily. 06/18/21   [name], FNP  Lactobacillus TABS Take 1 tablet by mouth daily. 04/23/21   [name], FNP  linaclotide Rolan Lipa) 72 MCG capsule Take 1 capsule (72 mcg total) by mouth daily before breakfast. 04/23/21   [name], FNP  nicotine (NICODERM CQ - DOSED IN MG/24 HOURS) 21 mg/24hr patch Place 1 patch (21 mg total) onto the skin daily. 12/04/21 12/04/22  [name], FNP  oxybutynin (DITROPAN XL) 10 MG 24 hr tablet Take 1 tablet (10 mg total) by mouth at bedtime. 11/04/21   [name], FNP  polyethylene glycol powder (MIRALAX) 17 GM/SCOOP powder Please take 6 capfuls of MiraLAX in a 32 oz bottle of Gatorade over 2-4 hour period. The following day take 3 capfuls. On day 3 start taking 1 capful 3 times a day. Slowly cut back as needed until you have normal bowel movements. 07/14/21   Fatima Blank, MD      Allergies    Patient has no known allergies.    Review of Systems   Review  of Systems  Gastrointestinal:  Positive for constipation.   Physical Exam Updated Vital Signs BP (!) 155/92 (BP Location: Right Arm)   Pulse 88   Temp 98.3 F (36.8 C)   Resp 18   Ht '6\' 1"'$  (1.854 m)   Wt 74.8 kg   SpO2 100%   BMI 21.77 kg/m  Physical Exam Vitals and nursing note reviewed.  HENT:     Head: Normocephalic.  Eyes:     Pupils: Pupils are equal, round, and reactive to light.  Cardiovascular:     Rate and Rhythm: Regular rhythm.  Pulmonary:     Breath sounds: No wheezing.  Abdominal:     Comments: Possible fullness on right abdomen.  Rectal shows soft stool without fecal impaction.  Musculoskeletal:        General: No tenderness.  Skin:    General: Skin is warm.     Capillary Refill: Capillary refill takes less than 2 seconds.  Neurological:     Mental Status: He is alert and oriented to person, place, and time.    ED Results / Procedures / Treatments   Labs (all labs ordered are listed, but only abnormal results are displayed) Labs Reviewed - No data to display  EKG None  Radiology No results found.  Procedures Procedures  Medications Ordered in ED Medications - No data to display  ED Course/ Medical Decision Making/ A&P                           Medical Decision Making  Patient with constipation.  Has had episodes of same.  No relief at home.  May have some mild abdominal fullness, particular on right side.  Rectal exam done with some soft stool but no impaction.  After that had large bowel movement and felt much better.  Has lost large amount of weight somewhat recently though.  Discussed with patient about further work-up for this.  We offered CT scan to evaluate for abdominal pathology such as a malignancy.  Patient is deferred at this time.  We will follow-up with PCP.  Do not feels that has to be done at this time.  Already is taking some laxatives at home.  Will discharge home.        Final Clinical Impression(s) / ED  Diagnoses Final diagnoses:  Constipation, unspecified constipation type  Weight loss    Rx / DC Orders ED Discharge Orders     None         Davonna Belling, MD 05/14/22 1924

## 2022-05-16 ENCOUNTER — Other Ambulatory Visit: Payer: Self-pay

## 2022-05-16 DIAGNOSIS — R143 Flatulence: Secondary | ICD-10-CM

## 2022-05-16 DIAGNOSIS — K5904 Chronic idiopathic constipation: Secondary | ICD-10-CM

## 2022-05-16 MED ORDER — POLYETHYLENE GLYCOL 3350 17 GM/SCOOP PO POWD: ORAL | 0 refills | Status: DC

## 2022-05-16 MED ORDER — LINACLOTIDE 72 MCG PO CAPS: 72.0000 ug | ORAL_CAPSULE | Freq: Every day | ORAL | 5 refills | Status: DC

## 2022-05-21 ENCOUNTER — Ambulatory Visit (INDEPENDENT_AMBULATORY_CARE_PROVIDER_SITE_OTHER): Payer: Medicare HMO | Admitting: Podiatry

## 2022-05-21 ENCOUNTER — Encounter: Payer: Self-pay | Admitting: Podiatry

## 2022-05-21 DIAGNOSIS — B351 Tinea unguium: Secondary | ICD-10-CM | POA: Diagnosis not present

## 2022-05-21 DIAGNOSIS — M79675 Pain in left toe(s): Secondary | ICD-10-CM | POA: Diagnosis not present

## 2022-05-21 DIAGNOSIS — M79674 Pain in right toe(s): Secondary | ICD-10-CM | POA: Diagnosis not present

## 2022-05-25 NOTE — Progress Notes (Signed)
  Subjective:  Patient ID: Nathan Morse, male    DOB: 1933-12-27,  MRN: [id number]  Nathan Morse presents to clinic today for painful thick toenails that are difficult to trim. Pain interferes with ambulation. Aggravating factors include wearing enclosed shoe gear. Pain is relieved with periodic professional debridement.  New problem(s): None.   PCP is [name], FNP , and last visit was May 13, 2022.  No Known Allergies  Review of Systems: Negative except as noted in the HPI.  Objective: No changes noted in today's physical examination.  Constitutional Nathan Morse is a pleasant 88 y.o. African American male, WD, WN in NAD. AAO x 3.   Vascular CFT immediate b/l LE. Palpable DP/PT pulses b/l LE. Digital hair absent b/l. Skin temperature gradient WNL b/l. No pain with calf compression b/l. No edema noted b/l. No cyanosis or clubbing noted b/l LE.  Neurologic Normal speech. Oriented to person, place, and time. Protective sensation intact 5/5 intact bilaterally with 10g monofilament b/l. Vibratory sensation intact b/l.  Dermatologic Pedal integument with normal turgor, texture and tone BLE. No open wounds b/l LE. No interdigital macerations noted b/l LE. Toenails 1-5 b/l elongated, discolored, dystrophic, thickened, crumbly with subungual debris and tenderness to dorsal palpation. Hyperkeratotic lesion(s) L 5th toe.  No erythema, no edema, no drainage, no fluctuance.  Orthopedic: Muscle strength 5/5 to all lower extremity muscle groups bilaterally. No pain, crepitus or joint limitation noted with ROM bilateral LE. Hammertoe(s) noted to the L 5th toe and R 5th toe.   Radiographs: None   Assessment/Plan: 1. Pain due to onychomycosis of toenails of both feet   -Patient was evaluated and treated. All patient's and/or POA's questions/concerns answered on today's visit. -Medicare ABN signed. Patient refuses services of paring of corns/calluses  today. Copy has been placed in patient's  chart. -Patient to continue soft, supportive shoe gear daily. -Mycotic toenails 1-5 bilaterally were debrided in length and girth with sterile nail nippers and dremel without incident. -Patient/POA to call should there be question/concern in the interim.   Return in about 3 months (around 08/21/2022).  Marzetta Board, DPM

## 2022-05-28 ENCOUNTER — Other Ambulatory Visit: Payer: Self-pay

## 2022-05-28 DIAGNOSIS — R3981 Functional urinary incontinence: Secondary | ICD-10-CM

## 2022-05-28 MED ORDER — OXYBUTYNIN CHLORIDE ER 10 MG PO TB24
10.0000 mg | ORAL_TABLET | Freq: Every day | ORAL | 1 refills | Status: DC
Start: 2022-05-28 — End: 2022-06-23

## 2022-06-10 ENCOUNTER — Encounter: Payer: Self-pay | Admitting: Nurse Practitioner

## 2022-06-10 ENCOUNTER — Ambulatory Visit (INDEPENDENT_AMBULATORY_CARE_PROVIDER_SITE_OTHER): Payer: Medicare HMO | Admitting: Nurse Practitioner

## 2022-06-10 VITALS — BP 114/62 | HR 74 | Temp 97.6°F | Ht <= 58 in | Wt 165.0 lb

## 2022-06-10 DIAGNOSIS — K5904 Chronic idiopathic constipation: Secondary | ICD-10-CM

## 2022-06-10 DIAGNOSIS — Z8546 Personal history of malignant neoplasm of prostate: Secondary | ICD-10-CM

## 2022-06-10 DIAGNOSIS — Z6824 Body mass index (BMI) 24.0-24.9, adult: Secondary | ICD-10-CM | POA: Diagnosis not present

## 2022-06-10 DIAGNOSIS — Z0000 Encounter for general adult medical examination without abnormal findings: Secondary | ICD-10-CM | POA: Diagnosis not present

## 2022-06-10 DIAGNOSIS — R634 Abnormal weight loss: Secondary | ICD-10-CM | POA: Diagnosis not present

## 2022-06-10 DIAGNOSIS — N5234 Erectile dysfunction following simple prostatectomy: Secondary | ICD-10-CM | POA: Diagnosis not present

## 2022-06-10 DIAGNOSIS — R3981 Functional urinary incontinence: Secondary | ICD-10-CM | POA: Diagnosis not present

## 2022-06-10 DIAGNOSIS — R5383 Other fatigue: Secondary | ICD-10-CM | POA: Diagnosis not present

## 2022-06-10 DIAGNOSIS — F17220 Nicotine dependence, chewing tobacco, uncomplicated: Secondary | ICD-10-CM

## 2022-06-10 DIAGNOSIS — R630 Anorexia: Secondary | ICD-10-CM | POA: Diagnosis not present

## 2022-06-10 DIAGNOSIS — K121 Other forms of stomatitis: Secondary | ICD-10-CM | POA: Diagnosis not present

## 2022-06-10 DIAGNOSIS — Z23 Encounter for immunization: Secondary | ICD-10-CM

## 2022-06-10 DIAGNOSIS — Z960 Presence of urogenital implants: Secondary | ICD-10-CM

## 2022-06-10 MED ORDER — MAGIC MOUTHWASH W/LIDOCAINE: 5.0000 mL | Freq: Three times a day (TID) | ORAL | 0 refills | Status: DC | PRN

## 2022-06-10 NOTE — Progress Notes (Signed)
I,Victoria T Hamilton,acting as a Education administrator for [name], FNP.,have documented all relevant documentation on the behalf of [name], FNP,as directed by  [name], FNP while in the presence of [name], [place].   This visit occurred during the SARS-CoV-2 public health emergency.  Safety protocols were in place, including screening questions prior to the visit, additional usage of staff PPE, and extensive cleaning of exam room while observing appropriate contact time as indicated for disinfecting solutions.  Subjective:     Patient ID: Nathan Morse , male    DOB: July 05, 1934 , 88 y.o.   MRN: [id number]   Chief Complaint  Patient presents with   Annual Exam    HPI  Patient presents for HM.  He reports losing lots of weight & not having a appetite. He will get the food but will not eat it when he gets it.      Past Medical History:  Diagnosis Date   Cancer ([place])    Seborrheic keratosis, inflamed 10/16/2020   Skin Surgery      Family History  Adopted: Yes  Family history unknown: Yes     Current Outpatient Medications:    linaclotide (LINZESS) 72 MCG capsule, Take 1 capsule (72 mcg total) by mouth daily before breakfast., Disp: 30 capsule, Rfl: 5   diclofenac Sodium (VOLTAREN) 1 % GEL, Apply 2 g topically 4 (four) times daily. (Patient not taking: Reported on 06/10/2022), Disp: 100 g, Rfl: 2   Lactobacillus TABS, Take 1 tablet by mouth daily. (Patient not taking: Reported on 06/10/2022), Disp: 90 tablet, Rfl: 1   magic mouthwash w/lidocaine SOLN, Take 5 mLs by mouth 3 (three) times daily as needed for mouth pain., Disp: 120 mL, Rfl: 0   nicotine (NICODERM CQ - DOSED IN MG/24 HOURS) 21 mg/24hr patch, Place 1 patch (21 mg total) onto the skin daily. (Patient not taking: Reported on 06/10/2022), Disp: 30 patch, Rfl: 1   oxybutynin (DITROPAN XL) 10 MG 24 hr tablet, Take 1 tablet (10 mg total) by mouth at bedtime., Disp: 90 tablet, Rfl: 1   polyethylene glycol powder (MIRALAX) 17  GM/SCOOP powder, Please take 6 capfuls of MiraLAX in a 32 oz bottle of Gatorade over 2-4 hour period. The following day take 3 capfuls. On day 3 start taking 1 capful 3 times a day. Slowly cut back as needed until you have normal bowel movements. (Patient not taking: Reported on 06/10/2022), Disp: 255 g, Rfl: 0   No Known Allergies   Men's preventive visit. Patient Health Questionnaire (PHQ-2) is  Flowsheet Row Clinical Support from 10/24/2021 in [facility]  PHQ-2 Total Score 0     Patient is on a regular diet, decreased appetite.  Marital status: Widowed. Relevant history for alcohol use is:  Social History   Substance and Sexual Activity  Alcohol Use No   Alcohol/week: 0.0 standard drinks of alcohol  . Relevant history for tobacco use is:  Social History   Tobacco Use  Smoking Status Never  Smokeless Tobacco Current   Types: Chew  Tobacco Comments   wants to quit, but hard. 01/03/22 - has only chewed tobacco twice since his last visit    Review of Systems  Constitutional: Negative.   HENT: Negative.    Eyes: Negative.   Respiratory: Negative.    Cardiovascular: Negative.   Gastrointestinal: Negative.   Endocrine: Negative.   Genitourinary: Negative.   Musculoskeletal: Negative.   Skin: Negative.   Allergic/Immunologic: Negative.   Neurological: Negative.  Hematological: Negative.   Psychiatric/Behavioral: Negative.       Today's Vitals   06/10/22 1113  BP: 114/62  Pulse: 74  Temp: 97.6 F (36.4 C)  SpO2: 98%  Weight: 165 lb (74.8 kg)  Height: 1' (0.305 m)  PainSc: 0-No pain   Body mass index is 805.61 kg/m.  Wt Readings from Last 3 Encounters:  06/10/22 165 lb (74.8 kg)  05/14/22 165 lb (74.8 kg)  05/13/22 162 lb (73.5 kg)    Objective:  Physical Exam Vitals reviewed.  Constitutional:      General: He is not in acute distress.    Appearance: Normal appearance.  HENT:     Head: Normocephalic and atraumatic.     Right Ear:  Tympanic membrane, ear canal and external ear normal. There is no impacted cerumen.     Left Ear: Tympanic membrane, ear canal and external ear normal. There is no impacted cerumen.     Nose: Nose normal.     Mouth/Throat:     Dentition: Abnormal dentition. Has dentures.     Comments: Left lower gum with small ulceration and does not have much gum tissue to support Eyes:     Pupils: Pupils are equal, round, and reactive to light.  Cardiovascular:     Rate and Rhythm: Normal rate and regular rhythm.     Pulses: Normal pulses.     Heart sounds: Normal heart sounds. No murmur heard. Pulmonary:     Effort: Pulmonary effort is normal. No respiratory distress.     Breath sounds: Normal breath sounds. No wheezing.  Abdominal:     General: Abdomen is flat. Bowel sounds are normal. There is no distension.     Palpations: Abdomen is soft.     Tenderness: There is no abdominal tenderness.  Genitourinary:    Comments: Has a penile implant.  Musculoskeletal:        General: No swelling or tenderness. Normal range of motion.     Cervical back: Normal range of motion and neck supple.  Skin:    General: Skin is warm and dry.     Capillary Refill: Capillary refill takes less than 2 seconds.  Neurological:     General: No focal deficit present.     Mental Status: He is alert and oriented to person, place, and time.     Cranial Nerves: No cranial nerve deficit.     Motor: No weakness.  Psychiatric:        Mood and Affect: Mood normal.        Behavior: Behavior normal.        Thought Content: Thought content normal.        Judgment: Judgment normal.         Assessment And Plan:    1. Encounter for annual physical exam Behavior modifications discussed and diet history reviewed.   Pt will continue to exercise regularly and modify diet with low GI, plant based foods and decrease intake of processed foods.  Recommend intake of daily multivitamin, Vitamin D, and calcium.  Recommend for  preventive screenings, as well as recommend immunizations that include influenza, TDAP, and Shingles (up to date)  2. Immunization due - PSA - Tdap vaccine greater than or equal to 7yo IM  3. BMI 24.0-24.9, adult  4. Unintended weight loss Comments: Will check CT scan to evaluate for unintentional weight loss.  - CBC - Iron, TIBC and Ferritin Panel - CT ABDOMEN PELVIS W CONTRAST; Future  5. Mouth ulceration Comments:  I have given him the phone number for Dr. Johnnette Litter  6. Chewing tobacco nicotine dependence without complication Comments: Encouraged to quit.   7. Chronic idiopathic constipation Comments: Doing better since his last visit to the ER.  8. Erectile dysfunction following simple prostatectomy - POCT Urinalysis Dipstick (81002)  9. History of penile implant  10. Decreased appetite - Iron, TIBC and Ferritin Panel - CT ABDOMEN PELVIS W CONTRAST; Future  11. Functional urinary incontinence - POCT Urinalysis Dipstick (81002)  12. Other fatigue - CMP14+EGFR - Lipid panel - CBC  13. History of prostate cancer   Patient was given opportunity to ask questions. Patient verbalized understanding of the plan and was able to repeat key elements of the plan. All questions were answered to their satisfaction.   [name], FNP    I, [name], FNP, have reviewed all documentation for this visit. The documentation on 06/10/22 for the exam, diagnosis, procedures, and orders are all accurate and complete.   THE PATIENT IS ENCOURAGED TO PRACTICE SOCIAL DISTANCING DUE TO THE COVID-19 PANDEMIC.

## 2022-06-11 LAB — IRON,TIBC AND FERRITIN PANEL
Ferritin: 236 ng/mL (ref 30–400)
Iron Saturation: 44 % (ref 15–55)
Iron: 92 ug/dL (ref 38–169)
Total Iron Binding Capacity: 211 ug/dL — ABNORMAL LOW (ref 250–450)
UIBC: 119 ug/dL (ref 111–343)

## 2022-06-11 LAB — LIPID PANEL
Chol/HDL Ratio: 3.3 ratio (ref 0.0–5.0)
Cholesterol, Total: 154 mg/dL (ref 100–199)
HDL: 46 mg/dL (ref >39)
LDL Chol Calc (NIH): 97 mg/dL (ref 0–99)
Triglycerides: 54 mg/dL (ref 0–149)
VLDL Cholesterol Cal: 11 mg/dL (ref 5–40)

## 2022-06-11 LAB — CMP14+EGFR
ALT: 11 IU/L (ref 0–44)
AST: 20 IU/L (ref 0–40)
Albumin/Globulin Ratio: 1.8 (ref 1.2–2.2)
Albumin: 4.2 g/dL (ref 3.6–4.6)
Alkaline Phosphatase: 105 IU/L (ref 44–121)
BUN/Creatinine Ratio: 10 (ref 10–24)
BUN: 13 mg/dL (ref 8–27)
Bilirubin Total: 0.9 mg/dL (ref 0.0–1.2)
CO2: 25 mmol/L (ref 20–29)
Calcium: 9.4 mg/dL (ref 8.6–10.2)
Chloride: 99 mmol/L (ref 96–106)
Creatinine, Ser: 1.36 mg/dL — ABNORMAL HIGH (ref 0.76–1.27)
Globulin, Total: 2.3 g/dL (ref 1.5–4.5)
Glucose: 88 mg/dL (ref 70–99)
Potassium: 4.1 mmol/L (ref 3.5–5.2)
Sodium: 142 mmol/L (ref 134–144)
Total Protein: 6.5 g/dL (ref 6.0–8.5)
eGFR: 51 mL/min/{1.73_m2} — ABNORMAL LOW (ref >59)

## 2022-06-11 LAB — CBC
Hematocrit: 41.4 % (ref 37.5–51.0)
Hemoglobin: 13.3 g/dL (ref 13.0–17.7)
MCH: 28.7 pg (ref 26.6–33.0)
MCHC: 32.1 g/dL (ref 31.5–35.7)
MCV: 89 fL (ref 79–97)
Platelets: 150 10*3/uL (ref 150–450)
RBC: 4.63 x10E6/uL (ref 4.14–5.80)
RDW: 12.2 % (ref 11.6–15.4)
WBC: 4.4 10*3/uL (ref 3.4–10.8)

## 2022-06-11 LAB — PSA: Prostate Specific Ag, Serum: 0.3 ng/mL (ref 0.0–4.0)

## 2022-06-19 ENCOUNTER — Other Ambulatory Visit: Payer: Self-pay

## 2022-06-19 DIAGNOSIS — K121 Other forms of stomatitis: Secondary | ICD-10-CM

## 2022-06-19 MED ORDER — MAGIC MOUTHWASH W/LIDOCAINE: 5.0000 mL | Freq: Three times a day (TID) | ORAL | 0 refills | Status: DC | PRN

## 2022-06-23 ENCOUNTER — Other Ambulatory Visit: Payer: Self-pay

## 2022-06-23 DIAGNOSIS — R3981 Functional urinary incontinence: Secondary | ICD-10-CM

## 2022-06-23 MED ORDER — OXYBUTYNIN CHLORIDE ER 10 MG PO TB24
10.0000 mg | ORAL_TABLET | Freq: Every day | ORAL | 1 refills | Status: DC
Start: 2022-06-23 — End: 2022-11-19

## 2022-07-07 ENCOUNTER — Ambulatory Visit
Admission: RE | Admit: 2022-07-07 | Discharge: 2022-07-07 | Disposition: A | Discharge status: Home / Self Care | Payer: Medicare HMO | Source: Ambulatory Visit | Attending: Nurse Practitioner | Admitting: Nurse Practitioner

## 2022-07-07 DIAGNOSIS — R634 Abnormal weight loss: Secondary | ICD-10-CM

## 2022-07-07 DIAGNOSIS — Z9079 Acquired absence of other genital organ(s): Secondary | ICD-10-CM | POA: Diagnosis not present

## 2022-07-07 DIAGNOSIS — R630 Anorexia: Secondary | ICD-10-CM

## 2022-07-07 DIAGNOSIS — I700 Atherosclerosis of aorta: Secondary | ICD-10-CM | POA: Diagnosis not present

## 2022-07-07 MED ORDER — IOPAMIDOL (ISOVUE-300) INJECTION 61%
100.0000 mL | Freq: Once | INTRAVENOUS | Status: AC | PRN
  Administered 2022-07-07: 100 mL via INTRAVENOUS

## 2022-07-08 ENCOUNTER — Other Ambulatory Visit: Payer: Self-pay | Admitting: Nurse Practitioner

## 2022-07-08 DIAGNOSIS — I700 Atherosclerosis of aorta: Secondary | ICD-10-CM

## 2022-07-08 MED ORDER — ATORVASTATIN CALCIUM 10 MG PO TABS: 10.0000 mg | ORAL_TABLET | Freq: Every day | ORAL | 3 refills | Status: DC

## 2022-08-05 IMAGING — CR DG LUMBAR SPINE COMPLETE 4+V
5 series · 5 of 5 positions shown · non-contrast
Comparison: 11/23/2018

CLINICAL DATA: Low back pain

EXAM:
LUMBAR SPINE - COMPLETE 4+ VIEW

[t lumbar spine ap]
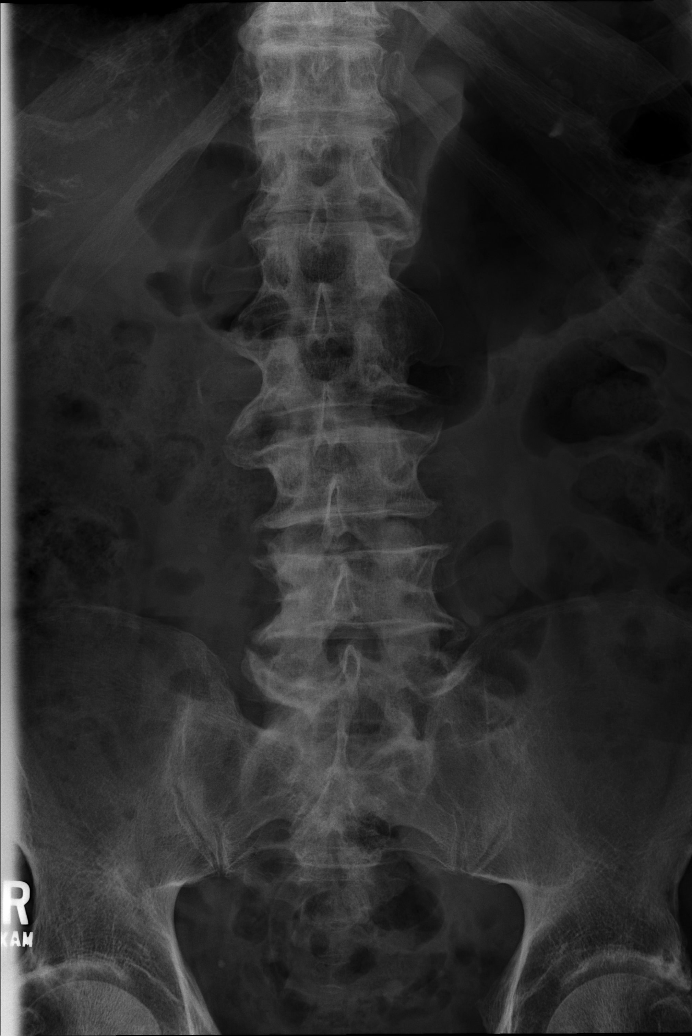

[t lumbar spine obl (1 of 2)]
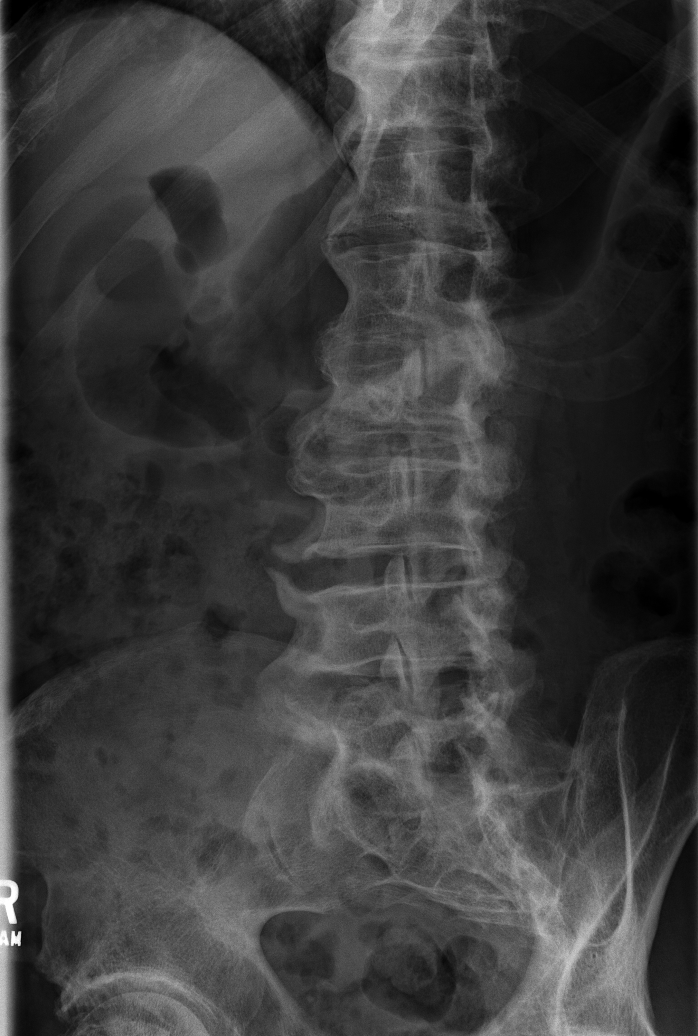

[t lumbar spine obl (2 of 2)]
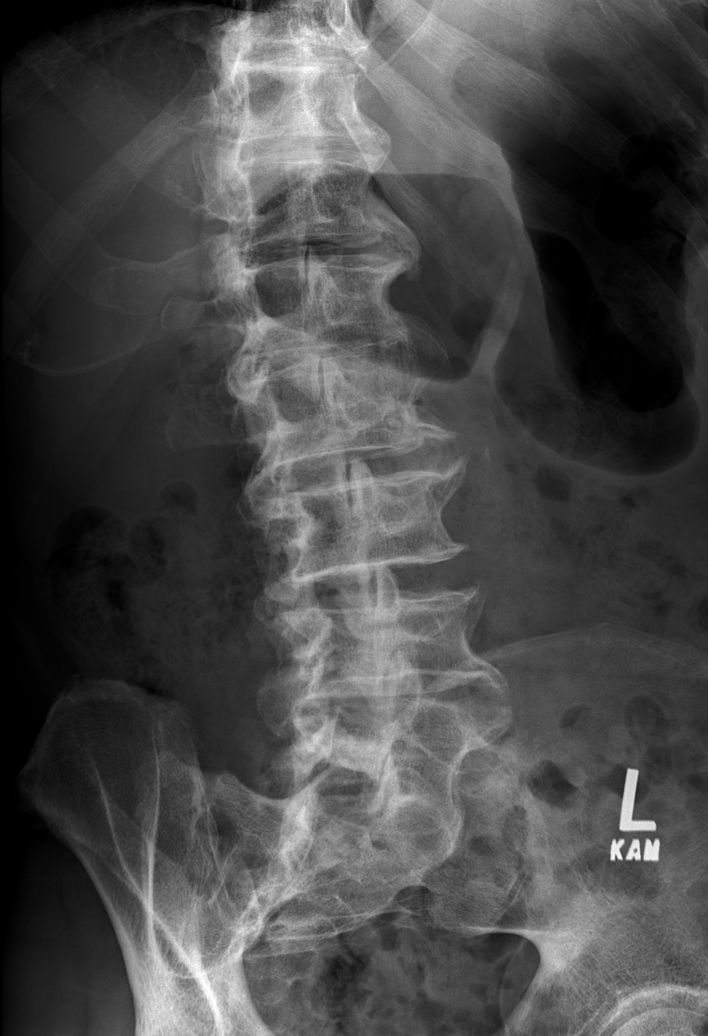

[t lumbar spine lat]
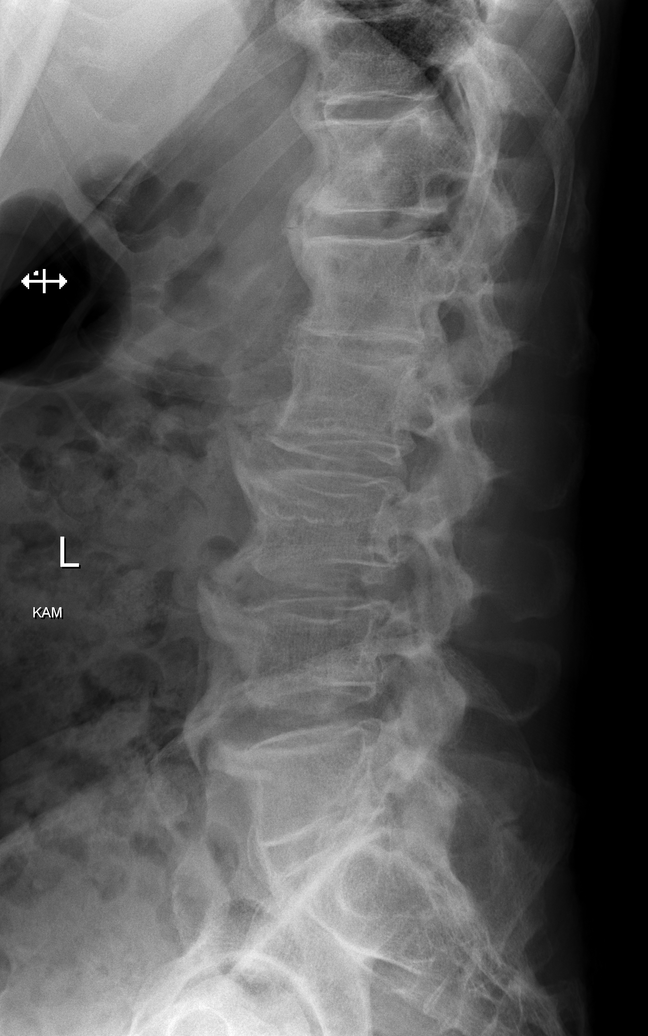

[t lumbar l-5 s-1 spot]
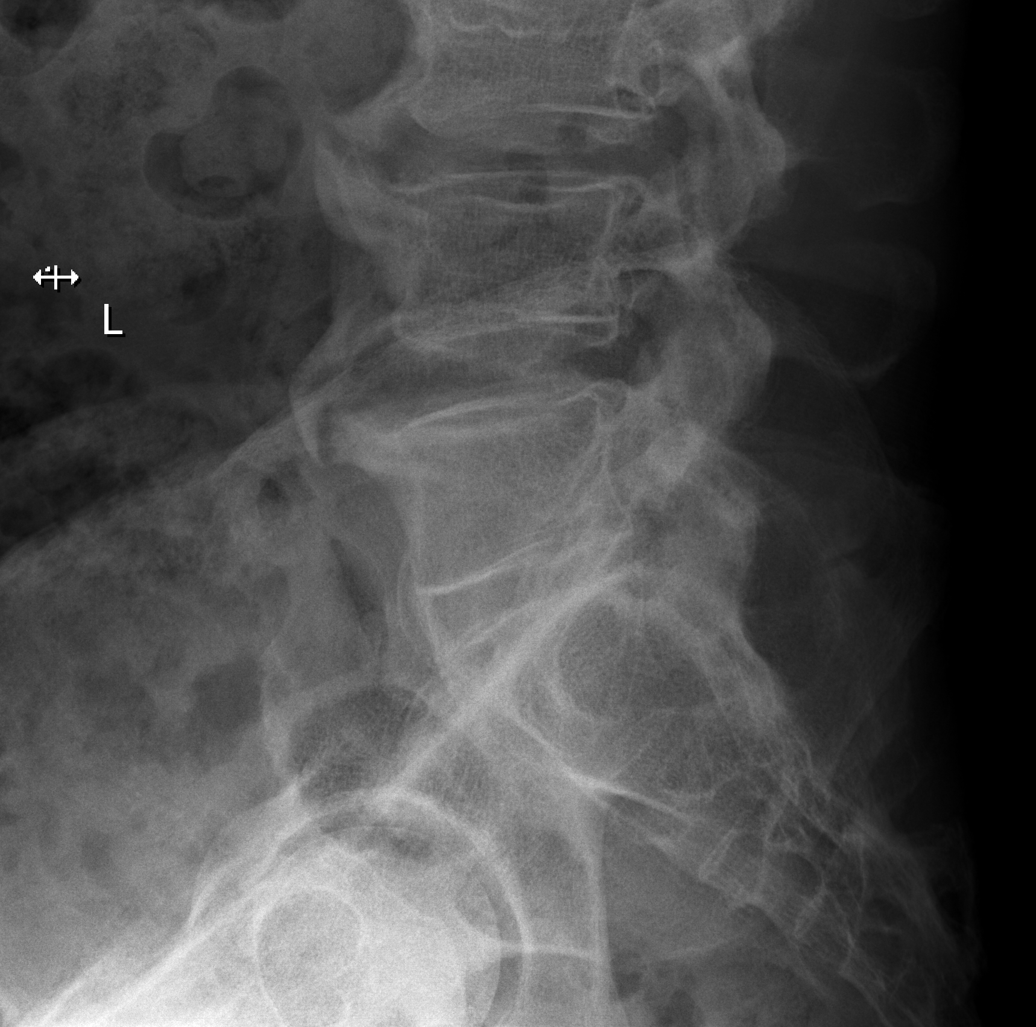

[5 of 5 positions shown; findings below may reference images not displayed]

FINDINGS: Multilevel degenerative changes are noted throughout the lumbar
spine. There is multilevel facet arthrosis. Large bridging
osteophytes are again noted. There is no significant malalignment.
No definite evidence for an acute compression fracture.
IMPRESSION: 1. No definite acute osseous abnormality.
2. Multilevel degenerative changes, similar to prior study.

## 2022-08-21 ENCOUNTER — Encounter: Payer: Self-pay | Admitting: Nurse Practitioner

## 2022-08-21 ENCOUNTER — Ambulatory Visit (INDEPENDENT_AMBULATORY_CARE_PROVIDER_SITE_OTHER): Payer: Medicare HMO | Admitting: Nurse Practitioner

## 2022-08-21 VITALS — BP 108/60 | HR 77 | Temp 98.3°F | Ht 72.0 in | Wt 149.0 lb

## 2022-08-21 DIAGNOSIS — R3911 Hesitancy of micturition: Secondary | ICD-10-CM | POA: Diagnosis not present

## 2022-08-21 DIAGNOSIS — R630 Anorexia: Secondary | ICD-10-CM

## 2022-08-21 DIAGNOSIS — Z23 Encounter for immunization: Secondary | ICD-10-CM | POA: Diagnosis not present

## 2022-08-21 DIAGNOSIS — R634 Abnormal weight loss: Secondary | ICD-10-CM

## 2022-08-21 DIAGNOSIS — E46 Unspecified protein-calorie malnutrition: Secondary | ICD-10-CM | POA: Diagnosis not present

## 2022-08-21 DIAGNOSIS — Z8546 Personal history of malignant neoplasm of prostate: Secondary | ICD-10-CM | POA: Diagnosis not present

## 2022-08-21 LAB — POCT URINALYSIS DIPSTICK
Blood, UA: NEGATIVE
Glucose, UA: NEGATIVE
Ketones, UA: NEGATIVE
Leukocytes, UA: NEGATIVE
Nitrite, UA: NEGATIVE
Protein, UA: NEGATIVE
Spec Grav, UA: >=1.03 — AB (ref 1.010–1.025)
Urobilinogen, UA: 4 E.U./dL — AB
pH, UA: 6 (ref 5.0–8.0)

## 2022-08-21 MED ORDER — MIRTAZAPINE 7.5 MG PO TABS: 7.5000 mg | ORAL_TABLET | Freq: Every day | ORAL | 1 refills | Status: DC

## 2022-08-21 NOTE — Progress Notes (Signed)
I,Nathan Morse,acting as a Education administrator for [employer], FNP.,have documented all relevant documentation on the behalf of Nathan Brine, FNP,as directed by  Nathan Brine, FNP while in the presence of Nathan Morse, Nathan Morse.  Subjective:     Patient ID: Nathan Morse , male    DOB: 10/21/1934 , 88 y.o.   MRN: [id number]   Chief Complaint  Patient presents with   Weight Loss    HPI  Patient presents today for weight loss.  He is able to chew his food now with his new lower dentures. He does to have an appetite. Drinks approximately 1 bottle water a day.   Wt Readings from Last 3 Encounters: 08/21/22 : 149 lb (67.6 kg) 06/10/22 : 165 lb (74.8 kg) 05/14/22 : 165 lb (74.8 kg)      Past Medical History:  Diagnosis Date   Cancer ([place])    Seborrheic keratosis, inflamed 10/16/2020   Skin Surgery      Family History  Adopted: Yes  Family history unknown: Yes     Current Outpatient Medications:    atorvastatin (LIPITOR) 10 MG tablet, Take 1 tablet (10 mg total) by mouth daily., Disp: 30 tablet, Rfl: 3   diclofenac Sodium (VOLTAREN) 1 % GEL, Apply 2 g topically 4 (four) times daily., Disp: 100 g, Rfl: 2   Lactobacillus TABS, Take 1 tablet by mouth daily., Disp: 90 tablet, Rfl: 1   linaclotide (LINZESS) 72 MCG capsule, Take 1 capsule (72 mcg total) by mouth daily before breakfast., Disp: 30 capsule, Rfl: 5   magic mouthwash w/lidocaine SOLN, Take 5 mLs by mouth 3 (three) times daily as needed for mouth pain., Disp: 120 mL, Rfl: 0   mirtazapine (REMERON) 7.5 MG tablet, Take 1 tablet (7.5 mg total) by mouth at bedtime., Disp: 30 tablet, Rfl: 1   oxybutynin (DITROPAN XL) 10 MG 24 hr tablet, Take 1 tablet (10 mg total) by mouth at bedtime., Disp: 90 tablet, Rfl: 1   polyethylene glycol powder (MIRALAX) 17 GM/SCOOP powder, Please take 6 capfuls of MiraLAX in a 32 oz bottle of Gatorade over 2-4 hour period. The following day take 3 capfuls. On day 3 start taking 1 capful 3 times a day. Slowly cut back  as needed until you have normal bowel movements., Disp: 255 g, Rfl: 0   No Known Allergies   Review of Systems  Constitutional:  Positive for unexpected weight change.  Respiratory: Negative.    Cardiovascular: Negative.   Gastrointestinal: Negative.   Neurological: Negative.   Psychiatric/Behavioral: Negative.       Today's Vitals   08/21/22 0855  BP: 108/60  Pulse: 77  Temp: 98.3 F (36.8 C)  TempSrc: Oral  Weight: 149 lb (67.6 kg)  Height: 6' (1.829 m)   Body mass index is 20.21 kg/m.   Objective:  Physical Exam Vitals reviewed.  Constitutional:      General: He is not in acute distress.    Appearance: Normal appearance.  HENT:     Mouth/Throat:     Dentition: Abnormal dentition. Has dentures.  Pulmonary:     Effort: Pulmonary effort is normal. No respiratory distress.     Breath sounds: Normal breath sounds. No wheezing.  Skin:    General: Skin is warm and dry.     Capillary Refill: Capillary refill takes less than 2 seconds.  Neurological:     General: No focal deficit present.     Mental Status: He is alert and oriented to person, place, and time.  Cranial Nerves: No cranial nerve deficit.     Motor: No weakness.  Psychiatric:        Mood and Affect: Mood normal.        Behavior: Behavior normal.        Thought Content: Thought content normal.        Judgment: Judgment normal.         Assessment And Plan:     1. Unintended weight loss Comments: Will check renal ultrasound due to continued weight loss, has 34 lbs in 8 months. Will check additional labs.  - TSH - CBC no Diff - POCT occult blood stool - US Renal; Future - Prealbumin - mirtazapine (REMERON) 7.5 MG tablet; Take 1 tablet (7.5 mg total) by mouth at bedtime.  Dispense: 30 tablet; Refill: 1 - CMP14+EGFR  2. Urinary hesitancy Comments: He has had a prostatectomy - POCT Urinalysis Dipstick (81002)  3. Decreased appetite Comments: Encouraged to drink protein shakes (Ensure). Also  will start Mirtazapine at bedtime and return in 4 weeks.  - CMP14+EGFR  4. History of prostate cancer  5. Need for influenza vaccination Influenza vaccine administered Encouraged to take Tylenol as needed for fever or muscle aches. - Flu Vaccine QUAD High Dose(Fluad)  6. Protein malnutrition ([place]) Comments: given protein shake and sent Rx for mirtazapine     Patient was given opportunity to ask questions. Patient verbalized understanding of the plan and was able to repeat key elements of the plan. All questions were answered to their satisfaction.  Nathan Brine, FNP   I, Nathan Brine, FNP, have reviewed all documentation for this visit. The documentation on 08/21/22 for the exam, diagnosis, procedures, and orders are all accurate and complete.   IF YOU HAVE BEEN REFERRED TO A SPECIALIST, IT MAY TAKE 1-2 WEEKS TO SCHEDULE/PROCESS THE REFERRAL. IF YOU HAVE NOT HEARD FROM US/SPECIALIST IN TWO WEEKS, PLEASE GIVE [place] A CALL AT [phone].   THE PATIENT IS ENCOURAGED TO PRACTICE SOCIAL DISTANCING DUE TO THE COVID-19 PANDEMIC.

## 2022-08-22 LAB — CBC
Hematocrit: 39.4 % (ref 37.5–51.0)
Hemoglobin: 12.5 g/dL — ABNORMAL LOW (ref 13.0–17.7)
MCH: 28.7 pg (ref 26.6–33.0)
MCHC: 31.7 g/dL (ref 31.5–35.7)
MCV: 91 fL (ref 79–97)
Platelets: 145 10*3/uL — ABNORMAL LOW (ref 150–450)
RBC: 4.35 x10E6/uL (ref 4.14–5.80)
RDW: 12 % (ref 11.6–15.4)
WBC: 3.5 10*3/uL (ref 3.4–10.8)

## 2022-08-22 LAB — CMP14+EGFR
ALT: 9 IU/L (ref 0–44)
AST: 22 IU/L (ref 0–40)
Albumin/Globulin Ratio: 1.9 (ref 1.2–2.2)
Albumin: 4 g/dL (ref 3.7–4.7)
Alkaline Phosphatase: 94 IU/L (ref 44–121)
BUN/Creatinine Ratio: 9 — ABNORMAL LOW (ref 10–24)
BUN: 13 mg/dL (ref 8–27)
Bilirubin Total: 0.8 mg/dL (ref 0.0–1.2)
CO2: 25 mmol/L (ref 20–29)
Calcium: 9.2 mg/dL (ref 8.6–10.2)
Chloride: 103 mmol/L (ref 96–106)
Creatinine, Ser: 1.41 mg/dL — ABNORMAL HIGH (ref 0.76–1.27)
Globulin, Total: 2.1 g/dL (ref 1.5–4.5)
Glucose: 96 mg/dL (ref 70–99)
Potassium: 4 mmol/L (ref 3.5–5.2)
Sodium: 141 mmol/L (ref 134–144)
Total Protein: 6.1 g/dL (ref 6.0–8.5)
eGFR: 49 mL/min/{1.73_m2} — ABNORMAL LOW (ref >59)

## 2022-08-22 LAB — PREALBUMIN: PREALBUMIN: 17 mg/dL (ref 9–32)

## 2022-08-22 LAB — TSH: TSH: 2.48 u[IU]/mL (ref 0.450–4.500)

## 2022-08-25 ENCOUNTER — Ambulatory Visit
Admission: RE | Admit: 2022-08-25 | Discharge: 2022-08-25 | Disposition: A | Discharge status: Home / Self Care | Payer: Medicare HMO | Source: Ambulatory Visit | Attending: Nurse Practitioner | Admitting: Nurse Practitioner

## 2022-08-25 DIAGNOSIS — R634 Abnormal weight loss: Secondary | ICD-10-CM

## 2022-09-01 ENCOUNTER — Ambulatory Visit (INDEPENDENT_AMBULATORY_CARE_PROVIDER_SITE_OTHER): Payer: Medicare HMO | Admitting: Podiatry

## 2022-09-01 DIAGNOSIS — Z91199 Patient's noncompliance with other medical treatment and regimen due to unspecified reason: Secondary | ICD-10-CM

## 2022-09-01 NOTE — Progress Notes (Signed)
1. No-show for appointment     

## 2022-09-02 ENCOUNTER — Other Ambulatory Visit: Payer: Self-pay

## 2022-09-02 ENCOUNTER — Other Ambulatory Visit: Payer: Medicare HMO

## 2022-09-02 ENCOUNTER — Encounter: Payer: Self-pay | Admitting: Podiatry

## 2022-09-02 ENCOUNTER — Ambulatory Visit (INDEPENDENT_AMBULATORY_CARE_PROVIDER_SITE_OTHER): Payer: Medicare HMO | Admitting: Podiatry

## 2022-09-02 DIAGNOSIS — N1831 Chronic kidney disease, stage 3a: Secondary | ICD-10-CM

## 2022-09-02 DIAGNOSIS — M79674 Pain in right toe(s): Secondary | ICD-10-CM

## 2022-09-02 DIAGNOSIS — M79675 Pain in left toe(s): Secondary | ICD-10-CM | POA: Diagnosis not present

## 2022-09-02 DIAGNOSIS — B351 Tinea unguium: Secondary | ICD-10-CM | POA: Diagnosis not present

## 2022-09-02 LAB — HEMOCCULT GUIAC POC 1CARD (OFFICE)

## 2022-09-03 ENCOUNTER — Other Ambulatory Visit: Payer: Medicare HMO

## 2022-09-03 LAB — CMP14+EGFR
ALT: 15 IU/L (ref 0–44)
AST: 25 IU/L (ref 0–40)
Albumin/Globulin Ratio: 2 (ref 1.2–2.2)
Albumin: 4 g/dL (ref 3.7–4.7)
Alkaline Phosphatase: 91 IU/L (ref 44–121)
BUN/Creatinine Ratio: 10 (ref 10–24)
BUN: 14 mg/dL (ref 8–27)
Bilirubin Total: 0.4 mg/dL (ref 0.0–1.2)
CO2: 28 mmol/L (ref 20–29)
Calcium: 9 mg/dL (ref 8.6–10.2)
Chloride: 103 mmol/L (ref 96–106)
Creatinine, Ser: 1.35 mg/dL — ABNORMAL HIGH (ref 0.76–1.27)
Globulin, Total: 2 g/dL (ref 1.5–4.5)
Glucose: 94 mg/dL (ref 70–99)
Potassium: 4.4 mmol/L (ref 3.5–5.2)
Sodium: 141 mmol/L (ref 134–144)
Total Protein: 6 g/dL (ref 6.0–8.5)
eGFR: 51 mL/min/{1.73_m2} — ABNORMAL LOW (ref >59)

## 2022-09-07 NOTE — Progress Notes (Signed)
  Subjective:  Patient ID: Nathan Morse, male    DOB: [birth year],  MRN: [id number]  88 y.o. male presents painful thick toenails that are difficult to trim. Pain interferes with ambulation. Aggravating factors include wearing enclosed shoe gear. Pain is relieved with periodic professional debridement.  New problem(s): None   PCP is [name], FNP , and last visit was August 21, 2022.  No Known Allergies  Review of Systems: Negative except as noted in the HPI.   Objective:  Darrius Montano is a pleasant 88 y.o. '@gender'$ @ WD, WN in NAD.  AAO x 3.  Vascular Examination: Vascular status intact b/l with palpable pedal pulses. CFT immediate b/l. No edema. No pain with calf compression b/l. Skin temperature gradient WNL b/l. Pedal hair sparse. No edema noted b/l LE.  Neurological Examination: Sensation grossly intact b/l with 10 gram monofilament. Vibratory sensation intact b/l.   Dermatological Examination: Pedal skin with normal turgor, texture and tone b/l. Toenails 1-5 b/l thick, discolored, elongated with subungual debris and pain on dorsal palpation. No hyperkeratotic lesions noted b/l. No open wounds b/l LE. No interdigital macerations noted b/l LE.  Musculoskeletal Examination: Muscle strength 5/5 to b/l LE. No pain, crepitus or joint limitation noted with ROM bilateral LE. Hammertoe(s) noted to the bilateral 5th toes.  Radiographs: None  Assessment:   1. Pain due to onychomycosis of toenails of both feet    Plan:  -Examined patient. -No new findings. No new orders. -Medicare ABN on file for refusal of paring of corn(s)/callus(es)/porokeratos(es). Copy in patient chart. -Toenails 1-5 b/l were debrided in length and girth with sterile nail nippers and dremel without iatrogenic bleeding.  -Patient/POA to call should there be question/concern in the interim.  Return in about 3 months (around 12/02/2022).  Marzetta Board, DPM

## 2022-09-24 ENCOUNTER — Other Ambulatory Visit: Payer: Self-pay | Admitting: Nurse Practitioner

## 2022-09-24 DIAGNOSIS — I700 Atherosclerosis of aorta: Secondary | ICD-10-CM

## 2022-11-05 ENCOUNTER — Ambulatory Visit (INDEPENDENT_AMBULATORY_CARE_PROVIDER_SITE_OTHER): Payer: Medicare HMO

## 2022-11-05 VITALS — BP 110/60 | HR 58 | Temp 98.5°F | Ht 70.0 in | Wt 152.0 lb

## 2022-11-05 DIAGNOSIS — Z0000 Encounter for general adult medical examination without abnormal findings: Secondary | ICD-10-CM | POA: Diagnosis not present

## 2022-11-05 NOTE — Patient Instructions (Signed)
Nathan Morse , Thank you for taking time to come for your Medicare Wellness Visit. I appreciate your ongoing commitment to your health goals. Please review the following plan we discussed and let me know if I can assist you in the future.   Screening recommendations/referrals: Colonoscopy: not required Recommended yearly ophthalmology/optometry visit for glaucoma screening and checkup Recommended yearly dental visit for hygiene and checkup  Vaccinations: Influenza vaccine: completed 08/21/2022 Pneumococcal vaccine: completed 06/05/2021 Tdap vaccine: completed 06/10/2022 Shingles vaccine: completed   Covid-19:  04/30/2021, 02/25/2020, 01/28/2020  Advanced directives: Please bring a copy of your POA (Power of Attorney) and/or Living Will to your next appointment.    Conditions/risks identified: none  Next appointment: Follow up in one year for your annual wellness visit.   Preventive Care 11 Years and Older, Male Preventive care refers to lifestyle choices and visits with your health care provider that can promote health and wellness. What does preventive care include? A yearly physical exam. This is also called an annual well check. Dental exams once or twice a year. Routine eye exams. Ask your health care provider how often you should have your eyes checked. Personal lifestyle choices, including: Daily care of your teeth and gums. Regular physical activity. Eating a healthy diet. Avoiding tobacco and drug use. Limiting alcohol use. Practicing safe sex. Taking low doses of aspirin every day. Taking vitamin and mineral supplements as recommended by your health care provider. What happens during an annual well check? The services and screenings done by your health care provider during your annual well check will depend on your age, overall health, lifestyle risk factors, and family history of disease. Counseling  Your health care provider may ask you questions about your: Alcohol  use. Tobacco use. Drug use. Emotional well-being. Home and relationship well-being. Sexual activity. Eating habits. History of falls. Memory and ability to understand (cognition). Work and work Statistician. Screening  You may have the following tests or measurements: Height, weight, and BMI. Blood pressure. Lipid and cholesterol levels. These may be checked every 5 years, or more frequently if you are over 39 years old. Skin check. Lung cancer screening. You may have this screening every year starting at age 35 if you have a 30-pack-year history of smoking and currently smoke or have quit within the past 15 years. Fecal occult blood test (FOBT) of the stool. You may have this test every year starting at age 47. Flexible sigmoidoscopy or colonoscopy. You may have a sigmoidoscopy every 5 years or a colonoscopy every 10 years starting at age 31. Prostate cancer screening. Recommendations will vary depending on your family history and other risks. Hepatitis C blood test. Hepatitis B blood test. Sexually transmitted disease (STD) testing. Diabetes screening. This is done by checking your blood sugar (glucose) after you have not eaten for a while (fasting). You may have this done every 1-3 years. Abdominal aortic aneurysm (AAA) screening. You may need this if you are a current or former smoker. Osteoporosis. You may be screened starting at age 61 if you are at high risk. Talk with your health care provider about your test results, treatment options, and if necessary, the need for more tests. Vaccines  Your health care provider may recommend certain vaccines, such as: Influenza vaccine. This is recommended every year. Tetanus, diphtheria, and acellular pertussis (Tdap, Td) vaccine. You may need a Td booster every 10 years. Zoster vaccine. You may need this after age 60. Pneumococcal 13-valent conjugate (PCV13) vaccine. One dose is recommended after  age 60. Pneumococcal polysaccharide  (PPSV23) vaccine. One dose is recommended after age 66. Talk to your health care provider about which screenings and vaccines you need and how often you need them. This information is not intended to replace advice given to you by your health care provider. Make sure you discuss any questions you have with your health care provider. Document Released: 12/28/2015 Document Revised: 08/20/2016 Document Reviewed: 10/02/2015 Elsevier Interactive Patient Education  2017 [place] Prevention in the Home Falls can cause injuries. They can happen to people of all ages. There are many things you can do to make your home safe and to help prevent falls. What can I do on the outside of my home? Regularly fix the edges of walkways and driveways and fix any cracks. Remove anything that might make you trip as you walk through a door, such as a raised step or threshold. Trim any bushes or trees on the path to your home. Use bright outdoor lighting. Clear any walking paths of anything that might make someone trip, such as rocks or tools. Regularly check to see if handrails are loose or broken. Make sure that both sides of any steps have handrails. Any raised decks and porches should have guardrails on the edges. Have any leaves, snow, or ice cleared regularly. Use sand or salt on walking paths during winter. Clean up any spills in your garage right away. This includes oil or grease spills. What can I do in the bathroom? Use night lights. Install grab bars by the toilet and in the tub and shower. Do not use towel bars as grab bars. Use non-skid mats or decals in the tub or shower. If you need to sit down in the shower, use a plastic, non-slip stool. Keep the floor dry. Clean up any water that spills on the floor as soon as it happens. Remove soap buildup in the tub or shower regularly. Attach bath mats securely with double-sided non-slip rug tape. Do not have throw rugs and other things on the  floor that can make you trip. What can I do in the bedroom? Use night lights. Make sure that you have a light by your bed that is easy to reach. Do not use any sheets or blankets that are too big for your bed. They should not hang down onto the floor. Have a firm chair that has side arms. You can use this for support while you get dressed. Do not have throw rugs and other things on the floor that can make you trip. What can I do in the kitchen? Clean up any spills right away. Avoid walking on wet floors. Keep items that you use a lot in easy-to-reach places. If you need to reach something above you, use a strong step stool that has a grab bar. Keep electrical cords out of the way. Do not use floor polish or wax that makes floors slippery. If you must use wax, use non-skid floor wax. Do not have throw rugs and other things on the floor that can make you trip. What can I do with my stairs? Do not leave any items on the stairs. Make sure that there are handrails on both sides of the stairs and use them. Fix handrails that are broken or loose. Make sure that handrails are as long as the stairways. Check any carpeting to make sure that it is firmly attached to the stairs. Fix any carpet that is loose or worn. Avoid having throw rugs  at the top or bottom of the stairs. If you do have throw rugs, attach them to the floor with carpet tape. Make sure that you have a light switch at the top of the stairs and the bottom of the stairs. If you do not have them, ask someone to add them for you. What else can I do to help prevent falls? Wear shoes that: Do not have high heels. Have rubber bottoms. Are comfortable and fit you well. Are closed at the toe. Do not wear sandals. If you use a stepladder: Make sure that it is fully opened. Do not climb a closed stepladder. Make sure that both sides of the stepladder are locked into place. Ask someone to hold it for you, if possible. Clearly mark and make  sure that you can see: Any grab bars or handrails. First and last steps. Where the edge of each step is. Use tools that help you move around (mobility aids) if they are needed. These include: Canes. Walkers. Scooters. Crutches. Turn on the lights when you go into a dark area. Replace any light bulbs as soon as they burn out. Set up your furniture so you have a clear path. Avoid moving your furniture around. If any of your floors are uneven, fix them. If there are any pets around you, be aware of where they are. Review your medicines with your doctor. Some medicines can make you feel dizzy. This can increase your chance of falling. Ask your doctor what other things that you can do to help prevent falls. This information is not intended to replace advice given to you by your health care provider. Make sure you discuss any questions you have with your health care provider. Document Released: 09/27/2009 Document Revised: 05/08/2016 Document Reviewed: 01/05/2015 Elsevier Interactive Patient Education  2017 [employer].

## 2022-11-05 NOTE — Progress Notes (Signed)
Subjective:   Nathan Morse is a 88 y.o. male who presents for Medicare Annual/Subsequent preventive examination.  Review of Systems     Cardiac Risk Factors include: advanced age (>56mn, >>35women);male gender     Objective:    Today's Vitals   11/05/22 1113  BP: 110/60  Pulse: (!) 58  Temp: 98.5 F (36.9 C)  TempSrc: Oral  SpO2: 99%  Weight: 152 lb (68.9 kg)  Height: '5\' 10"'$  (1.778 m)   Body mass index is 21.81 kg/m.     11/05/2022   11:22 AM 05/14/2022    6:24 PM 04/06/2022    8:09 PM 10/24/2021   11:17 AM 11/28/2020   10:09 AM 06/21/2020    8:09 AM 07/12/2019    9:26 AM  Advanced Directives  Does Patient Have a Medical Advance Directive? Yes No No Yes Yes No No  Type of AAcademic librarian  Living will HCrockerLiving will    Copy of [place] Chart? No - copy requested    No - copy requested    Would patient like information on creating a medical advance directive?  No - Patient declined    No - Patient declined No - Patient declined    Current Medications (verified) Outpatient Encounter Medications as of 11/05/2022  Medication Sig   atorvastatin (LIPITOR) 10 MG tablet TAKE 1 TABLET(10 MG) BY MOUTH DAILY   Lactobacillus TABS Take 1 tablet by mouth daily.   linaclotide (LINZESS) 72 MCG capsule Take 1 capsule (72 mcg total) by mouth daily before breakfast.   mirtazapine (REMERON) 7.5 MG tablet Take 1 tablet (7.5 mg total) by mouth at bedtime.   oxybutynin (DITROPAN XL) 10 MG 24 hr tablet Take 1 tablet (10 mg total) by mouth at bedtime.   polyethylene glycol powder (MIRALAX) 17 GM/SCOOP powder Please take 6 capfuls of MiraLAX in a 32 oz bottle of Gatorade over 2-4 hour period. The following day take 3 capfuls. On day 3 start taking 1 capful 3 times a day. Slowly cut back as needed until you have normal bowel movements.   diclofenac Sodium (VOLTAREN) 1 % GEL Apply 2 g topically 4 (four) times daily.  (Patient not taking: Reported on 11/05/2022)   magic mouthwash w/lidocaine SOLN Take 5 mLs by mouth 3 (three) times daily as needed for mouth pain. (Patient not taking: Reported on 11/05/2022)   No facility-administered encounter medications on file as of 11/05/2022.    Allergies (verified) Patient has no known allergies.   History: Past Medical History:  Diagnosis Date   Cancer ([place]    Seborrheic keratosis, inflamed 10/16/2020   Skin Surgery    Past Surgical History:  Procedure Laterality Date   APPENDECTOMY     INGUINAL HERNIA REPAIR Right 06/26/2020   Procedure: right open inguinal hernia repair with mesh;  Surgeon: Kinsinger, LArta Bruce MD;  Location: [facility];  Service: General;  Laterality: Right;   PENILE BIOPSY     SHOULDER ARTHROSCOPY     TONSILLECTOMY     TONSILLECTOMY AND ADENOIDECTOMY     Family History  Adopted: Yes  Family history unknown: Yes   Social History   Socioeconomic History   Marital status: Widowed    Spouse name: Not on file   Number of children: Not on file   Years of education: Not on file   Highest education level: Not on file  Occupational History   Occupation: retired  Tobacco Use  Smoking status: Never   Smokeless tobacco: Current    Types: Chew   Tobacco comments:    wants to quit, but hard. 01/03/22 - has only chewed tobacco twice since his last visit  Vaping Use   Vaping Use: Never used  Substance and Sexual Activity   Alcohol use: No    Alcohol/week: 0.0 standard drinks of alcohol   Drug use: No   Sexual activity: Not Currently  Other Topics Concern   Not on file  Social History Narrative   Not on file   Social Determinants of Health   Financial Resource Strain: Low Risk  (11/05/2022)   Overall Financial Resource Strain (CARDIA)    Difficulty of Paying Living Expenses: Not hard at all  Food Insecurity: No Food Insecurity (11/05/2022)   Hunger Vital Sign    Worried About Running Out of Food in the Last Year: Never true     Ran Out of Food in the Last Year: Never true  Transportation Needs: No Transportation Needs (11/05/2022)   PRAPARE - Hydrologist (Medical): No    Lack of Transportation (Non-Medical): No  Physical Activity: Inactive (11/05/2022)   Exercise Vital Sign    Days of Exercise per Week: 0 days    Minutes of Exercise per Session: 0 min  Stress: No Stress Concern Present (11/05/2022)   [place]    Feeling of Stress : Not at all  Social Connections: Not on file    Tobacco Counseling Ready to quit: Not Answered Counseling given: Not Answered Tobacco comments: wants to quit, but hard. 01/03/22 - has only chewed tobacco twice since his last visit   Clinical Intake:  Pre-visit preparation completed: Yes  Pain : No/denies pain     Nutritional Status: BMI of 19-24  Normal Nutritional Risks: None Diabetes: No  How often do you need to have someone help you when you read instructions, pamphlets, or other written materials from your doctor or pharmacy?: 1 - Never  Diabetic? no  Interpreter Needed?: No  Information entered by :: NAllen LPN   Activities of Daily Living    11/05/2022   11:23 AM  In your present state of health, do you have any difficulty performing the following activities:  Hearing? 0  Vision? 0  Difficulty concentrating or making decisions? 1  Walking or climbing stairs? 0  Dressing or bathing? 0  Doing errands, shopping? 0  Preparing Food and eating ? N  Using the Toilet? N  In the past six months, have you accidently leaked urine? Y  Do you have problems with loss of bowel control? N  Managing your Medications? Y  Comment family sets up  Managing your Finances? N  Housekeeping or managing your Housekeeping? N    Patient Care Team: [name], FNP as PCP - General (General Practice)  Indicate any recent Medical Services you may have received from other  than [facility] providers in the past year (date may be approximate).     Assessment:   This is a routine wellness examination for Nathan Morse.  Hearing/Vision screen Vision Screening - Comments:: Regular eye exams,   Dietary issues and exercise activities discussed: Current Exercise Habits: The patient does not participate in regular exercise at present   Goals Addressed             This Visit's Progress    Patient Stated       11/05/2022, wants to gain weight  Depression Screen    11/05/2022   11:23 AM 10/24/2021   11:18 AM 11/28/2020   10:10 AM 11/24/2019   10:46 AM 09/29/2019    2:11 PM 07/12/2019    9:27 AM 05/19/2019   10:40 AM  PHQ 2/9 Scores  PHQ - 2 Score 0 0 0 0 0 0 0  PHQ- 9 Score      9     Fall Risk    11/05/2022   11:22 AM 10/24/2021   11:18 AM 11/28/2020   10:10 AM 11/24/2019   10:46 AM 09/29/2019    2:11 PM  Fall Risk   Falls in the past year? 1 0 0 0 0  Comment tripped      Number falls in past yr: 0      Injury with Fall? 0      Risk for fall due to : Impaired mobility;Medication side effect Medication side effect Medication side effect    Follow up Falls evaluation completed;Education provided;Falls prevention discussed Falls evaluation completed;Education provided;Falls prevention discussed Falls evaluation completed;Falls prevention discussed;Education provided      FALL RISK PREVENTION PERTAINING TO THE HOME:  Any stairs in or around the home? Yes  If so, are there any without handrails? No  Home free of loose throw rugs in walkways, pet beds, electrical cords, etc? Yes  Adequate lighting in your home to reduce risk of falls? Yes   ASSISTIVE DEVICES UTILIZED TO PREVENT FALLS:  Life alert? No  Use of a cane, walker or w/c? Yes  Grab bars in the bathroom? Yes  Shower chair or bench in shower? Yes  Elevated toilet seat or a handicapped toilet? Yes   TIMED UP AND GO:  Was the test performed? Yes .  Length of time to ambulate 10 feet: 6  sec.   Gait slow and steady with assistive device  Cognitive Function: 6 CIT not administered. Patient says he can not read or write. Appears to have normal cognition per direct observation        11/28/2020   10:14 AM 07/12/2019    9:35 AM 11/17/2018   11:12 AM  6CIT Screen  What Year? 4 points 0 points 0 points  What month? 0 points 0 points 0 points  What time? 0 points 0 points 0 points  Count back from 20 2 points 2 points 2 points  Months in reverse 4 points 4 points 4 points  Repeat phrase 10 points 0 points 6 points  Total Score 20 points 6 points 12 points    Immunizations Immunization History  Administered Date(s) Administered   Fluad Quad(high Dose 65+) 08/23/2019, 09/03/2020, 10/30/2021, 08/21/2022   Influenza Nasal 08/23/2019   Influenza, High Dose Seasonal PF 11/17/2018   Moderna Sars-Covid-2 Vaccination 01/28/2020, 02/25/2020   PFIZER(Purple Top)SARS-COV-2 Vaccination 04/30/2021   PNEUMOCOCCAL CONJUGATE-20 06/05/2021, 06/05/2021   Tdap 05/21/2012, 06/10/2022   Zoster Recombinat (Shingrix) 06/15/2017, 08/17/2017    TDAP status: Up to date  Flu Vaccine status: Up to date  Pneumococcal vaccine status: Up to date  Covid-19 vaccine status: Completed vaccines  Qualifies for Shingles Vaccine? Yes   Zostavax completed Yes   Shingrix Completed?: Yes  Screening Tests Health Maintenance  Topic Date Due   COVID-19 Vaccine (4 - 2023-24 season) 08/15/2022   Medicare Annual Wellness (AWV)  10/24/2022   Pneumonia Vaccine 53+ Years old  Completed   INFLUENZA VACCINE  Completed   Zoster Vaccines- Shingrix  Completed   HPV VACCINES  Aged Out  Health Maintenance  Health Maintenance Due  Topic Date Due   COVID-19 Vaccine (4 - 2023-24 season) 08/15/2022   Medicare Annual Wellness (AWV)  10/24/2022    Colorectal cancer screening: No longer required.   Lung Cancer Screening: (Low Dose CT Chest recommended if Age 66-80 years, 30 pack-year currently smoking OR  have quit w/in 15years.) does not qualify.   Lung Cancer Screening Referral: no  Additional Screening:  Hepatitis C Screening: does not qualify;   Vision Screening: Recommended annual ophthalmology exams for early detection of glaucoma and other disorders of the eye. Is the patient up to date with their annual eye exam?  Yes  Who is the provider or what is the name of the office in which the patient attends annual eye exams? Can't remember name If pt is not established with a provider, would they like to be referred to a provider to establish care? No .   Dental Screening: Recommended annual dental exams for proper oral hygiene  Community Resource Referral / Chronic Care Management: CRR required this visit?  No   CCM required this visit?  No      Plan:     I have personally reviewed and noted the following in the patient's chart:   Medical and social history Use of alcohol, tobacco or illicit drugs  Current medications and supplements including opioid prescriptions. Patient is not currently taking opioid prescriptions. Functional ability and status Nutritional status Physical activity Advanced directives List of other physicians Hospitalizations, surgeries, and ER visits in previous 12 months Vitals Screenings to include cognitive, depression, and falls Referrals and appointments  In addition, I have reviewed and discussed with patient certain preventive protocols, quality metrics, and best practice recommendations. A written personalized care plan for preventive services as well as general preventive health recommendations were provided to patient.     Kellie Simmering, LPN   [id number]   Nurse Notes: none

## 2022-11-17 ENCOUNTER — Other Ambulatory Visit: Payer: Self-pay | Admitting: Nurse Practitioner

## 2022-11-17 DIAGNOSIS — R634 Abnormal weight loss: Secondary | ICD-10-CM

## 2022-11-19 ENCOUNTER — Other Ambulatory Visit: Payer: Self-pay

## 2022-11-19 DIAGNOSIS — R143 Flatulence: Secondary | ICD-10-CM

## 2022-11-19 DIAGNOSIS — I700 Atherosclerosis of aorta: Secondary | ICD-10-CM

## 2022-11-19 DIAGNOSIS — K5904 Chronic idiopathic constipation: Secondary | ICD-10-CM

## 2022-11-19 DIAGNOSIS — R634 Abnormal weight loss: Secondary | ICD-10-CM

## 2022-11-19 DIAGNOSIS — R3981 Functional urinary incontinence: Secondary | ICD-10-CM

## 2022-11-19 MED ORDER — MIRTAZAPINE 7.5 MG PO TABS: ORAL_TABLET | ORAL | 1 refills | Status: DC

## 2022-11-19 MED ORDER — POLYETHYLENE GLYCOL 3350 17 GM/SCOOP PO POWD: ORAL | 0 refills | Status: DC

## 2022-11-19 MED ORDER — LACTOBACILLUS PO TABS: 1.0000 | ORAL_TABLET | Freq: Every day | ORAL | 1 refills | Status: DC

## 2022-11-19 MED ORDER — ATORVASTATIN CALCIUM 10 MG PO TABS: ORAL_TABLET | ORAL | 1 refills | Status: DC

## 2022-11-19 MED ORDER — OXYBUTYNIN CHLORIDE ER 10 MG PO TB24: 10.0000 mg | ORAL_TABLET | Freq: Every day | ORAL | 1 refills | Status: DC

## 2022-11-19 MED ORDER — LINACLOTIDE 72 MCG PO CAPS: 72.0000 ug | ORAL_CAPSULE | Freq: Every day | ORAL | 5 refills | Status: DC

## 2022-12-05 DIAGNOSIS — Z8546 Personal history of malignant neoplasm of prostate: Secondary | ICD-10-CM | POA: Diagnosis not present

## 2022-12-05 DIAGNOSIS — N5201 Erectile dysfunction due to arterial insufficiency: Secondary | ICD-10-CM | POA: Diagnosis not present

## 2022-12-05 DIAGNOSIS — C61 Malignant neoplasm of prostate: Secondary | ICD-10-CM | POA: Diagnosis not present

## 2022-12-23 ENCOUNTER — Ambulatory Visit: Payer: Medicare HMO | Admitting: Podiatry

## 2022-12-23 ENCOUNTER — Ambulatory Visit (INDEPENDENT_AMBULATORY_CARE_PROVIDER_SITE_OTHER): Payer: Medicare HMO | Admitting: Podiatry

## 2022-12-23 VITALS — BP 138/72

## 2022-12-23 DIAGNOSIS — M79675 Pain in left toe(s): Secondary | ICD-10-CM

## 2022-12-23 DIAGNOSIS — M79674 Pain in right toe(s): Secondary | ICD-10-CM

## 2022-12-23 DIAGNOSIS — B351 Tinea unguium: Secondary | ICD-10-CM | POA: Diagnosis not present

## 2022-12-23 NOTE — Progress Notes (Signed)
  Subjective:  Patient ID: Nathan Morse, male    DOB: December 15, 1934,  MRN: [id number]  Nathan Morse presents to clinic today for painful elongated mycotic toenails 1-5 bilaterally which are tender when wearing enclosed shoe gear. Pain is relieved with periodic professional debridement.  Chief Complaint  Patient presents with   Nail Problem    RFC PCP-Moore, Janece PCP VST- "1 Month ago"   New problem(s): None.   PCP is [name], FNP.  No Known Allergies  Review of Systems: Negative except as noted in the HPI.  Objective: No changes noted in today's physical examination. Vitals:   12/23/22 1023  BP: 138/72   Nathan Morse is a pleasant 89 y.o. male WD, WN in NAD. AAO x 3.  Vascular Examination: Vascular status intact b/l with palpable pedal pulses. CFT immediate b/l. No edema. No pain with calf compression b/l. Skin temperature gradient WNL b/l. Pedal hair sparse. No edema noted b/l LE.  Neurological Examination: Sensation grossly intact b/l with 10 gram monofilament. Vibratory sensation intact b/l.   Dermatological Examination: Pedal skin with normal turgor, texture and tone b/l. Toenails 1-5 b/l thick, discolored, elongated with subungual debris and pain on dorsal palpation.   Hyperkeratotic lesion(s) bilateral 5th toes and submet head 5 b/l.  No erythema, no edema, no drainage, no fluctuance.  Musculoskeletal Examination: Muscle strength 5/5 to b/l LE. No pain, crepitus or joint limitation noted with ROM bilateral LE. Hammertoe(s) noted to the bilateral 5th toes.  Radiographs: None  Assessment/Plan: 1. Pain due to onychomycosis of toenails of both feet     -Patient was evaluated and treated. All patient's and/or POA's questions/concerns answered on today's visit. -Continue supportive shoe gear daily. -Mycotic toenails 1-5 bilaterally were debrided in length and girth with sterile nail nippers and dremel without incident. -As a courtesy, corn(s) bilateral 5th toes  pared utilizing sterile scalpel blade without complication or incident. Total number pared=2. -As a courtesy, callus(es) submet head 5 b/l pared utilizing sterile scalpel blade without complication or incident. Total number pared=2. -Patient/POA to call should there be question/concern in the interim.   Return in about 3 months (around 03/24/2023).  Marzetta Board, DPM

## 2022-12-27 ENCOUNTER — Encounter: Payer: Self-pay | Admitting: Podiatry

## 2023-02-04 ENCOUNTER — Encounter: Payer: Self-pay | Admitting: Nurse Practitioner

## 2023-02-04 ENCOUNTER — Ambulatory Visit (INDEPENDENT_AMBULATORY_CARE_PROVIDER_SITE_OTHER): Payer: Medicare PPO | Admitting: Nurse Practitioner

## 2023-02-04 VITALS — BP 124/58 | HR 74 | Temp 98.2°F | Ht 70.0 in | Wt 154.0 lb

## 2023-02-04 DIAGNOSIS — Z23 Encounter for immunization: Secondary | ICD-10-CM | POA: Diagnosis not present

## 2023-02-04 DIAGNOSIS — N1831 Chronic kidney disease, stage 3a: Secondary | ICD-10-CM | POA: Insufficient documentation

## 2023-02-04 DIAGNOSIS — K5904 Chronic idiopathic constipation: Secondary | ICD-10-CM

## 2023-02-04 DIAGNOSIS — R634 Abnormal weight loss: Secondary | ICD-10-CM

## 2023-02-04 MED ORDER — LINACLOTIDE 145 MCG PO CAPS: 145.0000 ug | ORAL_CAPSULE | Freq: Every day | ORAL | 1 refills | Status: DC

## 2023-02-04 NOTE — Progress Notes (Signed)
I,Sheena H Holbrook,acting as a Education administrator for [name], FNP.,have documented all relevant documentation on the behalf of [name], FNP,as directed by  [name], FNP while in the presence of [name], [place].    Subjective:     Patient ID: Nathan Morse , male    DOB: 12/29/1933 , 89 y.o.   MRN: [id number]   Chief Complaint  Patient presents with   Weight Loss   Cough    HPI  Patient presents today for concerns about weight loss. Patient states he has decreased appetite, constipation; denies nausea/emesis/diarrhea or abd pain. Patient does report increased urination, especially at night, with decreased urinary flow; denies cloudiness/bubbles/pain. Patient also has cough with white mucus, worse in the mornings; denies shob/wheeze.   He is not exercising regularly. He is not drinking any water - he is only drinking 1.5 bottles of water a day.  Wt Readings from Last 3 Encounters: 02/04/23 : 154 lb (69.9 kg) 11/05/22 : 152 lb (68.9 kg) 08/21/22 : 149 lb (67.6 kg)       Past Medical History:  Diagnosis Date   Cancer ([place])    Seborrheic keratosis, inflamed 10/16/2020   Skin Surgery      Family History  Adopted: Yes  Family history unknown: Yes     Current Outpatient Medications:    atorvastatin (LIPITOR) 10 MG tablet, TAKE 1 TABLET(10 MG) BY MOUTH DAILY, Disp: 90 tablet, Rfl: 1   docusate sodium (COLACE) 100 MG capsule, Take 100 mg by mouth 2 (two) times daily., Disp: , Rfl:    mirtazapine (REMERON) 7.5 MG tablet, TAKE 1 TABLET(7.5 MG) BY MOUTH AT BEDTIME, Disp: 90 tablet, Rfl: 1   oxybutynin (DITROPAN XL) 10 MG 24 hr tablet, Take 1 tablet (10 mg total) by mouth at bedtime., Disp: 90 tablet, Rfl: 1   diclofenac Sodium (VOLTAREN) 1 % GEL, Apply 2 g topically 4 (four) times daily. (Patient not taking: Reported on 11/05/2022), Disp: 100 g, Rfl: 2   Lactobacillus TABS, Take 1 tablet by mouth daily. (Patient not taking: Reported on 02/04/2023), Disp: 90 tablet, Rfl: 1    linaclotide (LINZESS) 145 MCG CAPS capsule, Take 1 capsule (145 mcg total) by mouth daily before breakfast., Disp: 90 capsule, Rfl: 1   magic mouthwash w/lidocaine SOLN, Take 5 mLs by mouth 3 (three) times daily as needed for mouth pain. (Patient not taking: Reported on 11/05/2022), Disp: 120 mL, Rfl: 0   polyethylene glycol powder (MIRALAX) 17 GM/SCOOP powder, Please take 6 capfuls of MiraLAX in a 32 oz bottle of Gatorade over 2-4 hour period. The following day take 3 capfuls. On day 3 start taking 1 capful 3 times a day. Slowly cut back as needed until you have normal bowel movements. (Patient not taking: Reported on 02/04/2023), Disp: 255 g, Rfl: 0   No Known Allergies   Review of Systems  Respiratory:  Positive for cough.   All other systems reviewed and are negative.    Today's Vitals   02/04/23 1233  BP: (!) 124/58  Pulse: 74  Temp: 98.2 F (36.8 C)  TempSrc: Oral  SpO2: 99%  Weight: 154 lb (69.9 kg)  Height: '5\' 10"'$  (1.778 m)   Body mass index is 22.1 kg/m.   Objective:  Physical Exam Vitals reviewed.  Constitutional:      General: He is not in acute distress.    Appearance: Normal appearance.  HENT:     Mouth/Throat:     Dentition: Abnormal dentition. Has dentures.  Pulmonary:  Effort: Pulmonary effort is normal. No respiratory distress.     Breath sounds: Normal breath sounds. No wheezing.  Abdominal:     General: Abdomen is flat. Bowel sounds are normal. There is no distension.     Palpations: Abdomen is soft.     Tenderness: There is no abdominal tenderness.  Skin:    General: Skin is warm and dry.     Capillary Refill: Capillary refill takes less than 2 seconds.  Neurological:     General: No focal deficit present.     Mental Status: He is alert and oriented to person, place, and time.     Cranial Nerves: No cranial nerve deficit.     Motor: No weakness.  Psychiatric:        Mood and Affect: Mood normal.        Behavior: Behavior normal.        Thought  Content: Thought content normal.        Judgment: Judgment normal.         Assessment And Plan:     1. Abnormal weight loss Comments: He continues ot have low weight, will check a CXR this has not been done. - DG Chest 2 View; Future - Amb Referral To Provider Referral Exercise Program (P.R.E.P)  2. Chronic idiopathic constipation Comments: Will refer to GI for further evaluation, current treatment is not effective. Continue to stay well hydrated with water. - AMB Referral to Chronic Care Management Services - Ambulatory referral to Gastroenterology - linaclotide (LINZESS) 145 MCG CAPS capsule; Take 1 capsule (145 mcg total) by mouth daily before breakfast.  Dispense: 90 capsule; Refill: 1 - Amb Referral To Provider Referral Exercise Program (P.R.E.P)  3. Stage 3a chronic kidney disease (HCC) - BMP8+eGFR - Multiple Myeloma Panel (SPEP&IFE w/QIG) - Parathyroid Hormone, Intact w/Ca  4. Encounter for administration of COVID-19 vaccine Given in office and observed for 15 minutes.  - Pfizer Fall 2023 Covid-19 Vaccine 31yr and older     Patient was given opportunity to ask questions. Patient verbalized understanding of the plan and was able to repeat key elements of the plan. All questions were answered to their satisfaction.  JMinette Brine FNP   I, JMinette Brine FNP, have reviewed all documentation for this visit. The documentation on 02/04/23 for the exam, diagnosis, procedures, and orders are all accurate and complete.   IF YOU HAVE BEEN REFERRED TO A SPECIALIST, IT MAY TAKE 1-2 WEEKS TO SCHEDULE/PROCESS THE REFERRAL. IF YOU HAVE NOT HEARD FROM US/SPECIALIST IN TWO WEEKS, PLEASE GIVE [place] CALL AT [phone].   THE PATIENT IS ENCOURAGED TO PRACTICE SOCIAL DISTANCING DUE TO THE COVID-19 PANDEMIC.

## 2023-02-06 ENCOUNTER — Telehealth: Payer: Self-pay

## 2023-02-06 NOTE — Telephone Encounter (Signed)
Call to pt reference PREP referral.  Explained program to pt and sts he could get to [facility] to do class.  Asked that I contact Lessie Dings and let her know (she looks after him) [phone] Will sent Manuela Schwartz and text with information.

## 2023-02-09 LAB — MULTIPLE MYELOMA PANEL, SERUM
Albumin SerPl Elph-Mcnc: 3.7 g/dL (ref 2.9–4.4)
Albumin/Glob SerPl: 1.6 (ref 0.7–1.7)
Alpha 1: 0.2 g/dL (ref 0.0–0.4)
Alpha2 Glob SerPl Elph-Mcnc: 0.4 g/dL (ref 0.4–1.0)
B-Globulin SerPl Elph-Mcnc: 0.7 g/dL (ref 0.7–1.3)
Gamma Glob SerPl Elph-Mcnc: 1 g/dL (ref 0.4–1.8)
Globulin, Total: 2.4 g/dL (ref 2.2–3.9)
IgA/Immunoglobulin A, Serum: 223 mg/dL (ref 61–437)
IgG (Immunoglobin G), Serum: 1097 mg/dL (ref 603–1613)
IgM (Immunoglobulin M), Srm: 103 mg/dL (ref 15–143)
Total Protein: 6.1 g/dL (ref 6.0–8.5)

## 2023-02-09 LAB — PTH, INTACT AND CALCIUM: PTH: 32 pg/mL (ref 15–65)

## 2023-02-09 LAB — BMP8+EGFR
BUN/Creatinine Ratio: 9 — ABNORMAL LOW (ref 10–24)
BUN: 11 mg/dL (ref 8–27)
CO2: 24 mmol/L (ref 20–29)
Calcium: 9 mg/dL (ref 8.6–10.2)
Chloride: 104 mmol/L (ref 96–106)
Creatinine, Ser: 1.27 mg/dL (ref 0.76–1.27)
Glucose: 85 mg/dL (ref 70–99)
Potassium: 4.3 mmol/L (ref 3.5–5.2)
Sodium: 142 mmol/L (ref 134–144)
eGFR: 55 mL/min/{1.73_m2} — ABNORMAL LOW (ref >59)

## 2023-02-12 DIAGNOSIS — K5904 Chronic idiopathic constipation: Secondary | ICD-10-CM | POA: Insufficient documentation

## 2023-02-12 DIAGNOSIS — R634 Abnormal weight loss: Secondary | ICD-10-CM | POA: Insufficient documentation

## 2023-02-19 ENCOUNTER — Ambulatory Visit (INDEPENDENT_AMBULATORY_CARE_PROVIDER_SITE_OTHER): Payer: Medicare PPO | Admitting: Nurse Practitioner

## 2023-02-19 ENCOUNTER — Encounter: Payer: Self-pay | Admitting: Nurse Practitioner

## 2023-02-19 VITALS — BP 112/52 | HR 64 | Temp 98.2°F | Ht 70.0 in | Wt 151.0 lb

## 2023-02-19 DIAGNOSIS — N1831 Chronic kidney disease, stage 3a: Secondary | ICD-10-CM | POA: Diagnosis not present

## 2023-02-19 DIAGNOSIS — K5904 Chronic idiopathic constipation: Secondary | ICD-10-CM | POA: Diagnosis not present

## 2023-02-19 DIAGNOSIS — E559 Vitamin D deficiency, unspecified: Secondary | ICD-10-CM

## 2023-02-19 DIAGNOSIS — I700 Atherosclerosis of aorta: Secondary | ICD-10-CM | POA: Diagnosis not present

## 2023-02-19 NOTE — Progress Notes (Signed)
I,Sheena H Holbrook,acting as a Education administrator for [name], FNP.,have documented all relevant documentation on the behalf of [name], FNP,as directed by  [name], FNP while in the presence of [name], [place].    Subjective:     Patient ID: Nathan Morse , male    DOB: 07/31/1934 , 89 y.o.   MRN: [id number]   Chief Complaint  Patient presents with   Medical Management of Chronic Issues    HPI  Patient presents today for follow up. He has just picked up the increased dose of Linzess. He has an appt on Monday with Dr. Collene Mares. He will start next month with PREP.   He does not eat many vegetables. He eats mostly chicken, fish, chili beans.      Past Medical History:  Diagnosis Date   Cancer ([place])    Seborrheic keratosis, inflamed 10/16/2020   Skin Surgery      Family History  Adopted: Yes  Family history unknown: Yes     Current Outpatient Medications:    atorvastatin (LIPITOR) 10 MG tablet, TAKE 1 TABLET(10 MG) BY MOUTH DAILY, Disp: 90 tablet, Rfl: 1   docusate sodium (COLACE) 100 MG capsule, Take 100 mg by mouth 2 (two) times daily., Disp: , Rfl:    linaclotide (LINZESS) 145 MCG CAPS capsule, Take 1 capsule (145 mcg total) by mouth daily before breakfast., Disp: 90 capsule, Rfl: 1   mirtazapine (REMERON) 7.5 MG tablet, TAKE 1 TABLET(7.5 MG) BY MOUTH AT BEDTIME, Disp: 90 tablet, Rfl: 1   oxybutynin (DITROPAN XL) 10 MG 24 hr tablet, Take 1 tablet (10 mg total) by mouth at bedtime., Disp: 90 tablet, Rfl: 1   diclofenac Sodium (VOLTAREN) 1 % GEL, Apply 2 g topically 4 (four) times daily. (Patient not taking: Reported on 11/05/2022), Disp: 100 g, Rfl: 2   No Known Allergies   Review of Systems  Constitutional:  Positive for unexpected weight change (feels he continues to lose weight).  Respiratory:  Negative for cough.   Cardiovascular: Negative.   Gastrointestinal:  Positive for constipation.  Psychiatric/Behavioral: Negative.    All other systems reviewed and are  negative.    Today's Vitals   02/19/23 0958  BP: (!) 112/52  Pulse: 64  Temp: 98.2 F (36.8 C)  TempSrc: Oral  SpO2: 99%  Weight: 151 lb (68.5 kg)  Height: 5\' 10"  (1.778 m)   Body mass index is 21.67 kg/m.   Objective:  Physical Exam Vitals reviewed.  Constitutional:      General: He is not in acute distress.    Appearance: Normal appearance.  HENT:     Mouth/Throat:     Dentition: Abnormal dentition. Has dentures.  Pulmonary:     Effort: Pulmonary effort is normal. No respiratory distress.     Breath sounds: Normal breath sounds. No wheezing.  Abdominal:     General: Abdomen is flat. Bowel sounds are normal. There is no distension.     Palpations: Abdomen is soft.     Tenderness: There is no abdominal tenderness.  Skin:    General: Skin is warm and dry.     Capillary Refill: Capillary refill takes less than 2 seconds.  Neurological:     General: No focal deficit present.     Mental Status: He is alert and oriented to person, place, and time.     Cranial Nerves: No cranial nerve deficit.     Motor: No weakness.  Psychiatric:        Mood and  Affect: Mood normal.        Behavior: Behavior normal.        Thought Content: Thought content normal.        Judgment: Judgment normal.         Assessment And Plan:     1. Chronic idiopathic constipation Comments: Advised to take Linzess at the higher dose and continue f/u with Dr. Collene Mares  2. Stage 3a chronic kidney disease ([place]) Comments: Stable.  Advised to avoid intake of NSAIDs.  Also encouraged to increase fluid intake. - Lipid panel  3. Vitamin D deficiency Will check vitamin D level and supplement as needed.    Also encouraged to spend 15 minutes in the sun daily.  - VITAMIN D 25 Hydroxy (Vit-D Deficiency, Fractures)  4. Aortic atherosclerosis (HCC) Comments: Continue statin, tolerating well. - Lipid panel - Liver Profile    Patient was given opportunity to ask questions. Patient verbalized understanding  of the plan and was able to repeat key elements of the plan. All questions were answered to their satisfaction.  [name], FNP   I, [name], FNP, have reviewed all documentation for this visit. The documentation on 02/19/23 for the exam, diagnosis, procedures, and orders are all accurate and complete.   IF YOU HAVE BEEN REFERRED TO A SPECIALIST, IT MAY TAKE 1-2 WEEKS TO SCHEDULE/PROCESS THE REFERRAL. IF YOU HAVE NOT HEARD FROM US/SPECIALIST IN TWO WEEKS, PLEASE GIVE [place] A CALL AT [phone].   THE PATIENT IS ENCOURAGED TO PRACTICE SOCIAL DISTANCING DUE TO THE COVID-19 PANDEMIC.

## 2023-02-23 DIAGNOSIS — K5904 Chronic idiopathic constipation: Secondary | ICD-10-CM | POA: Diagnosis not present

## 2023-03-04 ENCOUNTER — Other Ambulatory Visit: Payer: Self-pay | Admitting: Nurse Practitioner

## 2023-03-04 DIAGNOSIS — I700 Atherosclerosis of aorta: Secondary | ICD-10-CM

## 2023-03-04 DIAGNOSIS — N1831 Chronic kidney disease, stage 3a: Secondary | ICD-10-CM

## 2023-03-05 ENCOUNTER — Telehealth: Payer: Self-pay

## 2023-03-05 NOTE — Progress Notes (Signed)
  Chronic Care Management   Note  03/05/2023 Name: Nathan Morse MRN: [id number] DOB: 05-05-1934  Nathan Morse is a 89 y.o. year old male who is a primary care patient of [name], [place]. I reached out to Nathan Morse by phone today in response to a referral sent by Nathan Morse PCP.  Nathan Morse was given information about Chronic Care Management services today including:  CCM service includes personalized support from designated clinical staff supervised by the physician, including individualized plan of care and coordination with other care providers 24/7 contact phone numbers for assistance for urgent and routine care needs. Service will only be billed when office clinical staff spend 20 minutes or more in a month to coordinate care. Only one practitioner may furnish and bill the service in a calendar month. The patient may stop CCM services at amy time (effective at the end of the month) by phone call to the office staff. The patient will be responsible for cost sharing (co-pay) or up to 20% of the service fee (after annual deductible is met)  Nathan Morse  agreedto scheduling an appointment with the CCM RN Case Manager   Follow up plan: Patient agreed to scheduled appointment with RN Case Manager on 03/17/2023(date/time).   Noreene Larsson, [place], [place] [address] Direct Dial: [phone] [email or web address]

## 2023-03-17 ENCOUNTER — Ambulatory Visit (INDEPENDENT_AMBULATORY_CARE_PROVIDER_SITE_OTHER): Payer: Medicare PPO

## 2023-03-17 ENCOUNTER — Telehealth: Payer: Medicare PPO

## 2023-03-17 DIAGNOSIS — I700 Atherosclerosis of aorta: Secondary | ICD-10-CM

## 2023-03-17 DIAGNOSIS — N1831 Chronic kidney disease, stage 3a: Secondary | ICD-10-CM

## 2023-03-17 NOTE — Plan of Care (Signed)
Chronic Care Management Provider Comprehensive Care Plan    03/17/2023 Name: Nathan Morse MRN: [id number] DOB: 1934/11/01  Referral to Chronic Care Management (CCM) services was placed by Provider:  [name] on Date: 03-04-2023 .  Chronic Condition 1: CKD Provider Assessment and Plan Comments: Stable.  Advised to avoid intake of NSAIDs.  Also encouraged to increase fluid intake. - Lipid panel   Expected Outcome/Goals Addressed This Visit (Provider CCM goals/Provider Assessment and plan   CCM (KIDNEY FAILURE)  EXPECTED OUTCOME:  MONITOR, SELF-MANAGE AND REDUCE SYMPTOMS OF KIDNEY FAILURE  Symptom Management Condition 1: Take all medications as prescribed Attend all scheduled provider appointments Call provider office for new concerns or questions  call the Suicide and Crisis Lifeline: 988 call the [place] National Suicide Prevention Lifeline: 313-238-3221 or TTY: (346) 433-9426 TTY 351-344-7245) to talk to a trained counselor call 1-800-273-TALK (toll free, 24 hour hotline) go to [facility] [address], [place] [phone]) if experiencing a Mental Health or [place] Crisis   Chronic Condition 2: Aortic Atherosclerosis Provider Assessment and Plan  Continue statin, tolerating well. - Lipid panel - Liver Profile   Expected Outcome/Goals Addressed This Visit (Provider CCM goals/Provider Assessment and plan   CCM (Aortic Atherosclerosis)  EXPECTED OUTCOME:  MONITOR, SELF-MANAGE AND REDUCE SYMPTOMS OF Aortic Atherosclerosis  Symptom Management Condition 2: Take all medications as prescribed Attend all scheduled provider appointments Call provider office for new concerns or questions  call the Suicide and Crisis Lifeline: 988 call the [place] National Suicide Prevention Lifeline: 951-349-7834 or TTY: 938-539-2553 TTY 231 158 5372) to talk to a trained counselor call 1-800-273-TALK (toll free, 24 hour hotline) go to [facility] [address] [place] [phone]) if experiencing a Mental Health or [place]  call for medicine refill 2 or 3 days before it runs out take all medications exactly as prescribed call doctor with any symptoms you believe are related to your medicine call doctor when you experience any new symptoms go to all doctor appointments as scheduled adhere to prescribed diet: Heart Healthy develop an exercise routine  Problem List Patient Active Problem List   Diagnosis Date Noted   Chronic idiopathic constipation 02/12/2023   Abnormal weight loss 02/12/2023   Stage 3a chronic kidney disease 02/04/2023   Seborrheic keratosis, inflamed 10/16/2020   Muscle spasm 05/19/2019   Corns and callosities 05/19/2019   Pain of toe of left foot 05/19/2019   Urinary tract infection with hematuria 03/22/2019   Vitamin D deficiency 03/22/2019   Dysphonia 03/24/2017    Medication Management  Current Outpatient Medications:    atorvastatin (LIPITOR) 10 MG tablet, TAKE 1 TABLET(10 MG) BY MOUTH DAILY, Disp: 90 tablet, Rfl: 1   diclofenac Sodium (VOLTAREN) 1 % GEL, Apply 2 g topically 4 (four) times daily. (Patient not taking: Reported on 11/05/2022), Disp: 100 g, Rfl: 2   docusate sodium (COLACE) 100 MG capsule, Take 100 mg by mouth 2 (two) times daily., Disp: , Rfl:    linaclotide (LINZESS) 145 MCG CAPS capsule, Take 1 capsule (145 mcg total) by mouth daily before breakfast., Disp: 90 capsule, Rfl: 1   mirtazapine (REMERON) 7.5 MG tablet, TAKE 1 TABLET(7.5 MG) BY MOUTH AT BEDTIME, Disp: 90 tablet, Rfl: 1   oxybutynin (DITROPAN XL) 10 MG 24 hr tablet, Take 1 tablet (10 mg total) by mouth at bedtime., Disp: 90 tablet, Rfl: 1  Cognitive Assessment Identity Confirmed: : Name; DOB Cognitive Status: Normal   Functional Assessment  Hearing Difficulty or Deaf: no Wear Glasses or Blind: no Concentrating, Remembering or Making Decisions Difficulty (CP):  no Difficulty Communicating: no Difficulty Eating/Swallowing: no Walking or Climbing Stairs Difficulty: no Dressing/Bathing Difficulty: no Doing Errands Independently Difficulty (such as shopping) (CP): no   Caregiver Assessment  Primary Source of Support/Comfort: friend Name of Support/Comfort Primary Source: Lessie Dings People in Home: alone Family Caregiver if Needed: friend(s) Family Caregiver Names: Manuela Schwartz- Friend Primary Roles/Responsibilities: retired   Planned Interventions  Assessed the patient     understanding of chronic kidney disease    Evaluation of current treatment plan related to chronic kidney disease self management and patient's adherence to plan as established by provider      Provided education to patient re: stroke prevention, s/s of heart attack and stroke    Reviewed prescribed diet heart healthy Reviewed medications with patient and discussed importance of compliance    Discussed complications of poorly controlled blood pressure such as heart disease, stroke, circulatory complications, vision complications, kidney impairment, sexual dysfunction    Reviewed scheduled/upcoming provider appointments including    Advised patient to discuss changes in kidney function, questions, or concerns with provider    Discussed plans with patient for ongoing care management follow up and provided patient with direct contact information for care management team    Screening for signs and symptoms of depression related to chronic disease state      Discussed the impact of chronic kidney disease on daily life and mental health and acknowledged and normalized feelings of disempowerment, fear, and frustration    Assessed social determinant of health barriers    Provided education on kidney disease progression    The patient did have a questions about the linzess he is taking. He states he is not having issues with constipation but now his stools are loose and he sometimes thinks  he might be "wet". Encouraged the patient to call the office if he notices diarrhea. Did send a message to the FNP for collaboration and recommendations for the patient. Will advise the patient accordingly after collaborating with the FNP. The patient is thankful he is not dealing with constipation. Denies any acute distress.   Engage patient in early, proactive and ongoing discussion about goals of care and what matters most to them    Support coping and stress management by recognizing current strategies and assist in developing new strategies such as mindfulness, journaling, relaxation techniques, problem-solving    Provider established cholesterol goals reviewed. The patient is at goal. The patient is compliant with the plan of care for effective management of his aortic atherosclerosis ; Counseled on importance of regular laboratory monitoring as prescribed; Provided HLD/Aortic Atherosclerosis  educational materials; Reviewed role and benefits of statin for ASCVD risk reduction. The patient is compliant with Lipitor. Denies any issues with medications and taking as prescribed. Sets his pills up without any issues; Discussed strategies to manage statin-induced myalgias; Reviewed importance of limiting foods high in cholesterol. Will send educational information on heart healthy diet.; Reviewed exercise goals and target of 150 minutes per week; Screening for signs and symptoms of depression related to chronic disease state;  Assessed social determinant of health barriers;      Interaction and coordination with outside resources, practitioners, and providers See CCM Referral  Care Plan: Printed and mailed to patient

## 2023-03-17 NOTE — Patient Instructions (Addendum)
Please call the care guide team at [phone] if you need to cancel or reschedule your appointment.   If you are experiencing a Mental Health or [place] or need someone to talk to, please call the Suicide and Crisis Lifeline: 988 call the [place] National Suicide Prevention Lifeline: 5312742767 or TTY: 646-639-9001 TTY [phone]) to talk to a trained counselor call 1-800-273-TALK (toll free, 24 hour hotline) go to [facility] [place] [phone])   Following is a copy of the CCM Program Consent:  CCM service includes personalized support from designated clinical staff supervised by the physician, including individualized plan of care and coordination with other care providers 24/7 contact phone numbers for assistance for urgent and routine care needs. Service will only be billed when office clinical staff spend 20 minutes or more in a month to coordinate care. Only one practitioner may furnish and bill the service in a calendar month. The patient may stop CCM services at amy time (effective at the end of the month) by phone call to the office staff. The patient will be responsible for cost sharing (co-pay) or up to 20% of the service fee (after annual deductible is met)  Following is a copy of your full provider care plan:   Goals Addressed             This Visit's Progress    CCM Expected Outcome:  Monitor, Self-Manage and Reduce Symptoms of: Aortic Atherosclerosis       Current Barriers:  Chronic Disease Management support and education needs related to effective management of aortic atherosclerosis  Lab Results  Component Value Date   CHOL 154 06/10/2022   HDL 46 06/10/2022   LDLCALC 97 06/10/2022   TRIG 54 06/10/2022   CHOLHDL 3.3 06/10/2022     Planned Interventions: Provider established cholesterol goals reviewed. The patient is at goal. The patient is compliant with the plan of care for  effective management of his aortic atherosclerosis ; Counseled on importance of regular laboratory monitoring as prescribed; Provided HLD/Aortic Atherosclerosis  educational materials; Reviewed role and benefits of statin for ASCVD risk reduction. The patient is compliant with Lipitor. Denies any issues with medications and taking as prescribed. Sets his pills up without any issues; Discussed strategies to manage statin-induced myalgias; Reviewed importance of limiting foods high in cholesterol. Will send educational information on heart healthy diet.; Reviewed exercise goals and target of 150 minutes per week; Screening for signs and symptoms of depression related to chronic disease state;  Assessed social determinant of health barriers;   Symptom Management: Take medications as prescribed   Attend all scheduled provider appointments Call provider office for new concerns or questions  call the Suicide and Crisis Lifeline: 988 call the [place] National Suicide Prevention Lifeline: (331)068-3593 or TTY: 206 866 6089 TTY [phone]) to talk to a trained counselor call 1-800-273-TALK (toll free, 24 hour hotline) go to [facility] [address] [place] [phone]) if experiencing a Mental Health or [place]  - take all medications exactly as prescribed - call doctor with any symptoms you believe are related to your medicine - call doctor when you experience any new symptoms - go to all doctor appointments as scheduled - adhere to prescribed diet: Heart Healthy diet  - develop an exercise routine  Follow Up Plan: Telephone follow up appointment with care management team member scheduled for: 05-13-2023 at 230 pm       CCM Expected Outcome:  Monitor, Self-Manage and Reduce Symptoms of: CKD       Current Barriers:  Knowledge Deficits related to changes in bowel habits and the medication he is taking. States that his stools are now  sometimes liquid and asking for recommendations Care Coordination needs related to ongoing support and education for effective management of CKD and other chronic conditions in a patient with CKD Chronic Disease Management support and education needs related to effective management of CKD  Planned Interventions: Assessed the patient     understanding of chronic kidney disease    Evaluation of current treatment plan related to chronic kidney disease self management and patient's adherence to plan as established by provider      Provided education to patient re: stroke prevention, s/s of heart attack and stroke    Reviewed prescribed diet heart healthy Reviewed medications with patient and discussed importance of compliance    Discussed complications of poorly controlled blood pressure such as heart disease, stroke, circulatory complications, vision complications, kidney impairment, sexual dysfunction    Reviewed scheduled/upcoming provider appointments including    Advised patient to discuss changes in kidney function, questions, or concerns with provider    Discussed plans with patient for ongoing care management follow up and provided patient with direct contact information for care management team    Screening for signs and symptoms of depression related to chronic disease state      Discussed the impact of chronic kidney disease on daily life and mental health and acknowledged and normalized feelings of disempowerment, fear, and frustration    Assessed social determinant of health barriers    Provided education on kidney disease progression    The patient did have a questions about the linzess he is taking. He states he is not having issues with constipation but now his stools are loose and he sometimes thinks he might be "wet". Encouraged the patient to call the office if he notices diarrhea. Did send a message to the FNP for collaboration and recommendations for the patient. Will advise the  patient accordingly after collaborating with the FNP. The patient is thankful he is not dealing with constipation. Denies any acute distress.   Engage patient in early, proactive and ongoing discussion about goals of care and what matters most to them    Support coping and stress management by recognizing current strategies and assist in developing new strategies such as mindfulness, journaling, relaxation techniques, problem-solving     Symptom Management: Take medications as prescribed   Attend all scheduled provider appointments Call provider office for new concerns or questions  call the Suicide and Crisis Lifeline: 988 call the [place] National Suicide Prevention Lifeline: 7176644394 or TTY: 6466000165 TTY 9344313744) to talk to a trained counselor call 1-800-273-TALK (toll free, 24 hour hotline) go to [facility] [address], [place] [phone]) if experiencing a Mental Health or [place]   Follow Up Plan: Telephone follow up appointment with care management team member scheduled for: 05-13-2023 at 230 pm          The patient verbalized understanding of instructions, educational materials, and care plan provided today and agreed to receive a mailed copy of patient instructions, educational materials, and care plan.   Telephone follow up appointment with care management team member scheduled for: 05-13-2023 at 230 pm  Linaclotide Capsules What is this medication? LINACLOTIDE (lin a KLOE tide) treats irritable bowel syndrome (IBS) with constipation. It may also treat chronic constipation. It works  by softening the stool, making it easier to have a bowel movement. This medicine may be used for other purposes; ask your health care provider or pharmacist if you have questions. COMMON BRAND NAME(S): Linzess What should I tell my care team before I take this medication? They need to know if you have any of these  conditions: Diarrhea History of blockage in your bowels History of stool (fecal) impaction An unusual or allergic reaction to linaclotide, other medications, foods, dyes, or preservatives Pregnant or trying to get pregnant Breast-feeding How should I use this medication? Take this medication by mouth with water. Take it as directed on the prescription label at the same time every day. Do not cut, crush or chew this medication. Swallow the capsules whole. Take it on an empty stomach, at least 30 minutes before a meal. If you have trouble swallowing, you may open the capsule and mix with applesauce or water. Keep taking it unless your care team tells you to stop. How to take with applesauce: Open the capsule and put the contents in 1 teaspoon of applesauce. Swallow the medication and applesauce right away. Do not chew the medication or applesauce. Do not store for later use. How to take with water: Pour 30 mL (1 ounce) of room temperature bottled water into a clean cup. Open the capsule and sprinkle all the beads into the cup of water. Swirl the mixture for at least 20 seconds. Drink the mixture right away. If beads remain in the cup, add 30 mL more of water and swirl. Drink the mixture right away. Do not store for later use. A special MedGuide will be given to you by the pharmacist with each prescription and refill. Be sure to read this information carefully each time. Talk to your care team about the use of this medication in children. While it may be given to children as young as 6 years for selected conditions, precautions do apply. Overdosage: If you think you have taken too much of this medicine contact a poison control center or emergency room at once. NOTE: This medicine is only for you. Do not share this medicine with others. What if I miss a dose? If you miss a dose, skip it. Take your next dose at the normal time. Do not take extra or 2 doses at the same time to make up for the missed  dose. What may interact with this medication? Interactions have not been studied. This list may not describe all possible interactions. Give your health care provider a list of all the medicines, herbs, non-prescription drugs, or dietary supplements you use. Also tell them if you smoke, drink alcohol, or use illegal drugs. Some items may interact with your medicine. What should I watch for while using this medication? Visit your care team for regular checks on your progress. Tell your care team if your symptoms do not start to get better or if they get worse. Diarrhea is a common side effect of this medication. It often begins within 2 weeks of starting this medication. Stop taking this medication and call your care team if you get severe diarrhea. Stop taking this medication and call your care team or go to the nearest hospital emergency room right away if you develop unusual or severe stomach pain, especially if you also have bright red, bloody stools or black stools that look like tar. What side effects may I notice from receiving this medication? Side effects that you should report to your care team as  soon as possible: Allergic reactions--skin rash, itching, hives, swelling of the face, lips, tongue, or throat Side effects that usually do not require medical attention (report to your care team if they continue or are bothersome): Bloating Diarrhea Gas Stomach pain This list may not describe all possible side effects. Call your doctor for medical advice about side effects. You may report side effects to FDA at 1-800-FDA-1088. Where should I keep my medication? Keep out of the reach of children and pets. Store at room temperature between 20 and 25 degrees C (68 and 77 degrees F). Keep this medication in the original container. Protect from moisture. Keep the container tightly closed. Do not throw out the packet in the container. It keeps the medication dry. Get rid of any unused medication  after the expiration date. To get rid of medications that are no longer needed or have expired: Take the medication to a medication take-back program. Check with your pharmacy or law enforcement to find a location. If you cannot return the medication, check the label or package insert to see if the medication should be thrown out in the garbage or flushed down the toilet. If you are not sure, ask your care team. If it is safe to put it in the trash, take the medication out of the container. Mix the medication with cat litter, dirt, coffee grounds, or other unwanted substance. Seal the mixture in a bag or container. Put it in the trash. NOTE: This sheet is a summary. It may not cover all possible information. If you have questions about this medicine, talk to your doctor, pharmacist, or health care provider.  2023 Elsevier/Gold Standard (2021-10-21 00:00:00) Heart-Healthy Eating Plan Eating a healthy diet is important for the health of your heart. A heart-healthy eating plan includes: Eating less unhealthy fats. Eating more healthy fats. Eating less salt in your food. Salt is also called sodium. Making other changes in your diet. Talk with your doctor or a diet specialist (dietitian) to create an eating plan that is right for you. What is my plan? Your doctor may recommend an eating plan that includes: Total fat: ______% or less of total calories a day. Saturated fat: ______% or less of total calories a day. Cholesterol: less than _________mg a day. Sodium: less than _________mg a day. What are tips for following this plan? Cooking Avoid frying your food. Try to bake, boil, grill, or broil it instead. You can also reduce fat by: Removing the skin from poultry. Removing all visible fats from meats. Steaming vegetables in water or broth. Meal planning  At meals, divide your plate into four equal parts: Fill one-half of your plate with vegetables and green salads. Fill one-fourth of your  plate with whole grains. Fill one-fourth of your plate with lean protein foods. Eat 2-4 cups of vegetables per day. One cup of vegetables is: 1 cup (91 g) broccoli or cauliflower florets. 2 medium carrots. 1 large bell pepper. 1 large sweet potato. 1 large tomato. 1 medium white potato. 2 cups (150 g) raw leafy greens. Eat 1-2 cups of fruit per day. One cup of fruit is: 1 small apple 1 large banana 1 cup (237 g) mixed fruit, 1 large orange,  cup (82 g) dried fruit, 1 cup (240 mL) 100% fruit juice. Eat more foods that have soluble fiber. These are apples, broccoli, carrots, beans, peas, and barley. Try to get 20-30 g of fiber per day. Eat 4-5 servings of nuts, legumes, and seeds per week: 1  serving of dried beans or legumes equals  cup (90 g) cooked. 1 serving of nuts is  oz (12 almonds, 24 pistachios, or 7 walnut halves). 1 serving of seeds equals  oz (8 g). General information Eat more home-cooked food. Eat less restaurant, buffet, and fast food. Limit or avoid alcohol. Limit foods that are high in starch and sugar. Avoid fried foods. Lose weight if you are overweight. Keep track of how much salt (sodium) you eat. This is important if you have high blood pressure. Ask your doctor to tell you more about this. Try to add vegetarian meals each week. Fats Choose healthy fats. These include olive oil and canola oil, flaxseeds, walnuts, almonds, and seeds. Eat more omega-3 fats. These include salmon, mackerel, sardines, tuna, flaxseed oil, and ground flaxseeds. Try to eat fish at least 2 times each week. Check food labels. Avoid foods with trans fats or high amounts of saturated fat. Limit saturated fats. These are often found in animal products, such as meats, butter, and cream. These are also found in plant foods, such as palm oil, palm kernel oil, and coconut oil. Avoid foods with partially hydrogenated oils in them. These have trans fats. Examples are stick margarine, some  tub margarines, cookies, crackers, and other baked goods. What foods should I eat? Fruits All fresh, canned (in natural juice), or frozen fruits. Vegetables Fresh or frozen vegetables (raw, steamed, roasted, or grilled). Green salads. Grains Most grains. Choose whole wheat and whole grains most of the time. Rice and pasta, including brown rice and pastas made with whole wheat. Meats and other proteins Lean, well-trimmed beef, veal, pork, and lamb. Chicken and [place] without skin. All fish and shellfish. Wild duck, rabbit, pheasant, and venison. Egg whites or low-cholesterol egg substitutes. Dried beans, peas, lentils, and tofu. Seeds and most nuts. Dairy Low-fat or nonfat cheeses, including ricotta and mozzarella. Skim or 1% milk that is liquid, powdered, or evaporated. Buttermilk that is made with low-fat milk. Nonfat or low-fat yogurt. Fats and oils Non-hydrogenated (trans-free) margarines. Vegetable oils, including soybean, sesame, sunflower, olive, peanut, safflower, corn, canola, and cottonseed. Salad dressings or mayonnaise made with a vegetable oil. Beverages Mineral water. Coffee and tea. Diet carbonated beverages. Sweets and desserts Sherbet, gelatin, and fruit ice. Small amounts of dark chocolate. Limit all sweets and desserts. Seasonings and condiments All seasonings and condiments. The items listed above may not be a complete list of foods and drinks you can eat. Contact a dietitian for more options. What foods should I avoid? Fruits Canned fruit in heavy syrup. Fruit in cream or butter sauce. Fried fruit. Limit coconut. Vegetables Vegetables cooked in cheese, cream, or butter sauce. Fried vegetables. Grains Breads that are made with saturated or trans fats, oils, or whole milk. Croissants. Sweet rolls. Donuts. High-fat crackers, such as cheese crackers. Meats and other proteins Fatty meats, such as hot dogs, ribs, sausage, bacon, rib-eye roast or steak. High-fat deli meats,  such as salami and bologna. Caviar. Domestic duck and goose. Organ meats, such as liver. Dairy Cream, sour cream, cream cheese, and creamed cottage cheese. Whole-milk cheeses. Whole or 2% milk that is liquid, evaporated, or condensed. Whole buttermilk. Cream sauce or high-fat cheese sauce. Yogurt that is made from whole milk. Fats and oils Meat fat, or shortening. Cocoa butter, hydrogenated oils, palm oil, coconut oil, palm kernel oil. Solid fats and shortenings, including bacon fat, salt pork, lard, and butter. Nondairy cream substitutes. Salad dressings with cheese or sour cream. Beverages Regular sodas  and juice drinks with added sugar. Sweets and desserts Frosting. Pudding. Cookies. Cakes. Pies. Milk chocolate or white chocolate. Buttered syrups. Full-fat ice cream or ice cream drinks. The items listed above may not be a complete list of foods and drinks to avoid. Contact a dietitian for more information. Summary Heart-healthy meal planning includes eating less unhealthy fats, eating more healthy fats, and making other changes in your diet. Eat a balanced diet. This includes fruits and vegetables, low-fat or nonfat dairy, lean protein, nuts and legumes, whole grains, and heart-healthy oils and fats. This information is not intended to replace advice given to you by your health care provider. Make sure you discuss any questions you have with your health care provider. Document Revised: 01/06/2022 Document Reviewed: 01/06/2022 Elsevier Patient Education  [employer]. Exercise Information for Aging Adults Staying physically active is important as you age. Physical activity and exercise can help in maintaining quality of life, health, physical function, and reducing falls. The four types of exercises that are best for older adults are endurance, strength, balance, and flexibility. Contact your health care provider before you start any exercise routine. Ask your health care provider what  activities are safe for you. What are the risks? Risks associated with exercising include: Overdoing it. This may lead to sore muscles or fatigue. Falls. Injuries. Dehydration. How to do these exercises Endurance exercises Endurance (aerobic) exercises raise your breathing rate and heart rate. Increasing your endurance helps you do everyday tasks and stay healthy. By improving the health of your body system that includes your heart, lungs, and blood vessels (circulatory system), you may also delay or prevent diseases such as heart disease, diabetes, and weak bones (osteoporosis). Types of endurance exercises include: Sports. Indoor activities, such as using gym equipment, doing water aerobics, or dancing. Outdoor activities, such as biking or jogging. Tasks around the house, such as gardening, yard work, and heavy household chores like cleaning. Walking, such as hiking or walking around your neighborhood. When doing endurance exercises, make sure you: Are aware of your surroundings. Use safety equipment as directed. Dress in layers when exercising outdoors. Drink plenty of water to stay well hydrated. Build up endurance slowly. Start with 10 minutes at a time, and gradually build up to doing 30 minutes at a time. Unless your health care provider gave you different instructions, aim to exercise for a total of 150 minutes a week. Spread out that time so you are working on endurance 3 or more days a week. Strength exercises Lifting, pulling, or pushing weights helps to strengthen muscles. Having stronger muscles makes it easier to do everyday activities, such as getting up from a chair, climbing stairs, carrying groceries, and playing with grandchildren. Strength exercises include arm and leg exercises that may be done: With weights. Without weights (using your own body weight). With a resistance band. When doing strength exercises: Move smoothly and steadily. Do not suddenly thrust or jerk  the weights, the resistance band, or your body. Start with no weights or with light weights, and gradually add more weight over time. Eventually, aim to use weights that are hard or very hard for you to lift. This means that you are able to do 8 repetitions with the weight, and the last few repetitions are very challenging. Lift or push weights into position for 3 seconds, hold the position for 1 second, and then take 3 seconds to return to your starting position. Breathe out (exhale) during difficult movements, like lifting or pushing weights.  Breathe in (inhale) to relax your muscles before the next repetition. Consider alternating arms or legs, especially when you first start strength exercises. Expect some slight muscle soreness after each session. Do strength exercises on 2 or more days a week, for 30 minutes at a time. Avoid exercising the same muscle groups two days in a row. For example, if you work on your leg muscles one day, work on your arm muscles the next day. When you can do two sets of 10-15 repetitions with a certain weight, increase the amount of weight. Balance exercises Balance exercises can help to prevent falls. Balance exercises include: Standing on one foot. Heel-to-toe walk. Balance walk. Tai chi. Make sure you have something sturdy to hold onto while doing balance exercises, such as a sturdy chair. As your balance improves, challenge yourself by holding on to the chair with one hand instead of two, and then with no hands. Trying exercises with your eyes closed also challenges your balance, but be sure to have a sturdy surface (like a countertop) close by in case you need it. Do balance exercises as often as you want, or as often as directed by your health care provider. Flexibility exercises  Flexibility exercises improve how far you can bend, straighten, move, or rotate parts of your body (range of motion). These exercises also help you do everyday activities such as  getting dressed or reaching for objects. Flexibility exercises include stretching different parts of the body, and they may be done in a standing or seated position or on the floor. When stretching, make sure you: Keep a slight bend in your arms and legs. Avoid completely straightening ("locking") your joints. Do not stretch so far that you feel pain. You should feel a mild stretching feeling. You may try stretching farther as you become more flexible over time. Relax and breathe between stretches. Hold on to something sturdy for balance as needed. Hold each stretch for 10-30 seconds. Repeat each stretch 3-5 times. General safety tips Exercise in well-lit areas. Do not hold your breath during exercises or stretches. Warm up before exercising, and cool down after exercising. This can help prevent injury. Drink plenty of water during exercise or any activity that makes you sweat. If you are not sure if an exercise is safe for you, or you are not sure how to do an exercise, talk with your health care provider. This is especially important if you have had surgery on muscles, bones, or joints (orthopedic surgery). Where to find more information You can find more information about exercise for older adults from: Your local health department, fitness center, or community center. These facilities may have programs for aging adults. [employer] on Aging: http://kim-miller.com/ National Council on Aging: www.ncoa.org Summary Staying physically active is important as you age. Doing endurance, strength, balance, and flexibility exercises can help in maintaining quality of life, health, physical function, and reducing falls. Make sure to contact your health care provider before you start any exercise routine. Ask your health care provider what activities are safe for you. This information is not intended to replace advice given to you by your health care provider. Make sure you discuss any questions you  have with your health care provider. Document Revised: 04/15/2021 Document Reviewed: 04/15/2021 Elsevier Patient Education  [place].

## 2023-03-17 NOTE — Chronic Care Management (AMB) (Signed)
Chronic Care Management   CCM RN Visit Note  03/17/2023 Name: Nathan Morse MRN: [id number] DOB: [birth year]  Subjective: Nathan Morse is a 89 y.o. year old male who is a primary care patient of [name], [place]. The patient was referred to the Chronic Care Management team for assistance with care management needs subsequent to provider initiation of CCM services and plan of care.    Today's Visit:  Engaged with patient by telephone for initial visit.     SDOH Interventions Today    Flowsheet Row Most Recent Value  SDOH Interventions   Food Insecurity Interventions Intervention Not Indicated  Housing Interventions Intervention Not Indicated  Transportation Interventions Intervention Not Indicated  Utilities Interventions Intervention Not Indicated  Alcohol Usage Interventions Intervention Not Indicated (Score <7)  Financial Strain Interventions Intervention Not Indicated  Physical Activity Interventions Other (Comments), [facility] [employer]  [the patient works in his yard in his garden, he is going to start going to [employer] and working out. Discussed activity and pacing activity]  Stress Interventions Intervention Not Indicated  Social Connections Interventions Intervention Not Indicated, Other (Comment)  [good support from his friends, has a son he gets to see regularly, his family is from a different part of [place]]         Goals Addressed             This Visit's Progress    CCM Expected Outcome:  Monitor, Self-Manage and Reduce Symptoms of: Aortic Atherosclerosis       Current Barriers:  Chronic Disease Management support and education needs related to effective management of aortic atherosclerosis  Lab Results  Component Value Date   CHOL 154 06/10/2022   HDL 46 06/10/2022   LDLCALC 97 06/10/2022   TRIG 54 06/10/2022   CHOLHDL 3.3 06/10/2022     Planned Interventions: Provider established cholesterol goals reviewed. The patient is at goal. The patient is compliant with the  plan of care for effective management of his aortic atherosclerosis ; Counseled on importance of regular laboratory monitoring as prescribed; Provided HLD/Aortic Atherosclerosis  educational materials; Reviewed role and benefits of statin for ASCVD risk reduction. The patient is compliant with Lipitor. Denies any issues with medications and taking as prescribed. Sets his pills up without any issues; Discussed strategies to manage statin-induced myalgias; Reviewed importance of limiting foods high in cholesterol. Will send educational information on heart healthy diet.; Reviewed exercise goals and target of 150 minutes per week; Screening for signs and symptoms of depression related to chronic disease state;  Assessed social determinant of health barriers;   Symptom Management: Take medications as prescribed   Attend all scheduled provider appointments Call provider office for new concerns or questions  call the Suicide and Crisis Lifeline: 988 call the [place] National Suicide Prevention Lifeline: (223) 643-9987 or TTY: 6623398338 TTY (314) 677-3125) to talk to a trained counselor call 1-800-273-TALK (toll free, 24 hour hotline) go to [facility] [address] [place] [phone]) if experiencing a Mental Health or [place]  - take all medications exactly as prescribed - call doctor with any symptoms you believe are related to your medicine - call doctor when you experience any new symptoms - go to all doctor appointments as scheduled - adhere to prescribed diet: Heart Healthy diet  - develop an exercise routine  Follow Up Plan: Telephone follow up appointment with care management team member scheduled for: 05-13-2023 at 230 pm       CCM Expected Outcome:  Monitor, Self-Manage and Reduce Symptoms of: CKD       Current Barriers:  Knowledge Deficits related to changes in bowel habits and the medication he is taking. States that his  stools are now sometimes liquid and asking for recommendations Care Coordination needs related to ongoing support and education for effective management of CKD and other chronic conditions in a patient with CKD Chronic Disease Management support and education needs related to effective management of CKD  Planned Interventions: Assessed the patient     understanding of chronic kidney disease    Evaluation of current treatment plan related to chronic kidney disease self management and patient's adherence to plan as established by provider      Provided education to patient re: stroke prevention, s/s of heart attack and stroke    Reviewed prescribed diet heart healthy Reviewed medications with patient and discussed importance of compliance    Discussed complications of poorly controlled blood pressure such as heart disease, stroke, circulatory complications, vision complications, kidney impairment, sexual dysfunction    Reviewed scheduled/upcoming provider appointments including    Advised patient to discuss changes in kidney function, questions, or concerns with provider    Discussed plans with patient for ongoing care management follow up and provided patient with direct contact information for care management team    Screening for signs and symptoms of depression related to chronic disease state      Discussed the impact of chronic kidney disease on daily life and mental health and acknowledged and normalized feelings of disempowerment, fear, and frustration    Assessed social determinant of health barriers    Provided education on kidney disease progression    The patient did have a questions about the linzess he is taking. He states he is not having issues with constipation but now his stools are loose and he sometimes thinks he might be "wet". Encouraged the patient to call the office if he notices diarrhea. Did send a message to the FNP for collaboration and recommendations for the patient.  Will advise the patient accordingly after collaborating with the FNP. The patient is thankful he is not dealing with constipation. Denies any acute distress.   Engage patient in early, proactive and ongoing discussion about goals of care and what matters most to them    Support coping and stress management by recognizing current strategies and assist in developing new strategies such as mindfulness, journaling, relaxation techniques, problem-solving     Symptom Management: Take medications as prescribed   Attend all scheduled provider appointments Call provider office for new concerns or questions  call the Suicide and Crisis Lifeline: 988 call the [place] National Suicide Prevention Lifeline: 727 222 0995 or TTY: 339-537-6651 TTY 949-872-1213) to talk to a trained counselor call 1-800-273-TALK (toll free, 24 hour hotline) go to [facility] [address], [place] [phone]) if experiencing a Mental Health or [place]   Follow Up Plan: Telephone follow up appointment with care management team member scheduled for: 05-13-2023 at 230 pm          Plan:Telephone follow up appointment with care management team member scheduled for:  05-13-2023 at 40 pm  [place], MSN, CCM RN Care Manager  Chronic Care Management Direct Number: [phone]

## 2023-04-01 ENCOUNTER — Ambulatory Visit (INDEPENDENT_AMBULATORY_CARE_PROVIDER_SITE_OTHER): Payer: Medicare PPO | Admitting: Podiatry

## 2023-04-01 VITALS — BP 153/56

## 2023-04-01 DIAGNOSIS — L84 Corns and callosities: Secondary | ICD-10-CM | POA: Diagnosis not present

## 2023-04-01 DIAGNOSIS — M79675 Pain in left toe(s): Secondary | ICD-10-CM | POA: Diagnosis not present

## 2023-04-01 DIAGNOSIS — B351 Tinea unguium: Secondary | ICD-10-CM

## 2023-04-01 DIAGNOSIS — M79674 Pain in right toe(s): Secondary | ICD-10-CM

## 2023-04-01 NOTE — Progress Notes (Signed)
  Subjective:  Patient ID: Nathan Morse, male    DOB: 18-Feb-1934,  MRN: [id number]  Nathan Morse presents to clinic today for corn(s) both feet, callus(es) b/l feet and painful mycotic nails.  Pain interferes with ambulation. Aggravating factors include wearing enclosed shoe gear. Painful toenails interfere with ambulation. Aggravating factors include wearing enclosed shoe gear. Pain is relieved with periodic professional debridement. Painful corns and calluses are aggravated when weightbearing with and without shoegear. Pain is relieved with periodic professional debridement.  Chief Complaint  Patient presents with   Nail Problem    [facility] PCP-[facility] PCP VST- 1 month ago   New problem(s): None.   PCP is [name], FNP.  No Known Allergies  Review of Systems: Negative except as noted in the HPI.  Objective:  Vitals:   04/01/23 1344  BP: (!) 153/56   [name] is a pleasant 89 y.o. male WD, WN in NAD. AAO x 3.  Vascular Examination: Vascular status intact b/l with palpable pedal pulses. CFT immediate b/l. No edema. No pain with calf compression b/l. Skin temperature gradient WNL b/l. Pedal hair sparse. No edema noted b/l LE.  Neurological Examination: Sensation grossly intact b/l with 10 gram monofilament. Vibratory sensation intact b/l.   Dermatological Examination: Pedal skin with normal turgor, texture and tone b/l. Toenails 1-5 b/l thick, discolored, elongated with subungual debris and pain on dorsal palpation.   Hyperkeratotic lesion(s) bilateral 5th toes and submet head 5 left foot.  No erythema, no edema, no drainage, no fluctuance.  Musculoskeletal Examination: Muscle strength 5/5 to b/l LE. No pain, crepitus or joint limitation noted with ROM bilateral LE. Hammertoe(s) noted to the bilateral 5th toes.  Radiographs: None  Assessment/Plan: 1. Pain due to onychomycosis of toenails of both feet   2. Corns and callosities   3. Pain in toes of both feet   -Consent  given for treatment as described below: -Examined patient. -Toenails 1-5 b/l were debrided in length and girth with sterile nail nippers and dremel without iatrogenic bleeding.  -Corn(s) bilateral 5th toes and callus(es) submet head 5 left foot were pared utilizing sterile scalpel blade without incident. Total number debrided =3. -Patient/POA to call should there be question/concern in the interim.   Return in about 3 months (around 07/01/2023).  Freddie Breech, DPM

## 2023-04-03 ENCOUNTER — Telehealth: Payer: Self-pay

## 2023-04-03 NOTE — Telephone Encounter (Signed)
Attempted to call pt this am reference next PREP class starting on 04/14/23 at [facility].  Unable to leave a message due to mailbox being full. Sent text to phone number [phone]

## 2023-04-05 ENCOUNTER — Encounter: Payer: Self-pay | Admitting: Podiatry

## 2023-04-14 DIAGNOSIS — N1831 Chronic kidney disease, stage 3a: Secondary | ICD-10-CM

## 2023-05-13 ENCOUNTER — Telehealth: Payer: Medicare HMO

## 2023-05-13 ENCOUNTER — Telehealth: Payer: Self-pay

## 2023-05-13 NOTE — Telephone Encounter (Signed)
   CCM RN Visit Note   05-13-2023 Name: Nathan Morse MRN: [id number]      DOB: Nov 03, 1934  Subjective: [name] is a 89 y.o. year old male who is a primary care patient of [name], FNP. The patient was referred to the Chronic Care Management team for assistance with care management needs subsequent to provider initiation of CCM services and plan of care.      An unsuccessful telephone outreach was attempted today to contact the patient about Chronic Care Management needs.    Plan:A HIPAA compliant phone message was left for the patient providing contact information and requesting a return call.  [name] RN, MSN, CCM RN Care Manager  Chronic Care Management Direct Number: [phone]

## 2023-05-21 ENCOUNTER — Telehealth: Payer: Self-pay

## 2023-05-21 NOTE — Telephone Encounter (Signed)
Voicemail was left by patients "family friend" to call patient because patient needed a appointment because he is stumbling a lot. Called patient to schedule appointment but patient did not answer, VM was left for PT.

## 2023-05-27 ENCOUNTER — Encounter: Payer: Self-pay | Admitting: Nurse Practitioner

## 2023-05-27 ENCOUNTER — Ambulatory Visit (INDEPENDENT_AMBULATORY_CARE_PROVIDER_SITE_OTHER): Payer: Medicare HMO | Admitting: Nurse Practitioner

## 2023-05-27 VITALS — Temp 98.8°F | Ht 70.0 in | Wt 150.2 lb

## 2023-05-27 DIAGNOSIS — R2689 Other abnormalities of gait and mobility: Secondary | ICD-10-CM | POA: Diagnosis not present

## 2023-05-27 NOTE — Progress Notes (Signed)
I,[name],acting as a Neurosurgeon for Nathan Felts, FNP.,have documented all relevant documentation on the behalf of Nathan Felts, FNP,as directed by  Nathan Felts, FNP while in the presence of Nathan Felts, FNP.  Subjective:  Patient ID: Nathan Morse , male    DOB: 1934/04/08 , 89 y.o.   MRN: [id number]  Chief Complaint  Patient presents with   stumble    HPI  Patient presents today for concerns about stumbling a lot, patient hasn't had any falls but he is having a lot of stumbles. Patient reports when he wakes up he has to sit for about 5 minutes before getting up if not he will feel like he is stumbling. Denies having any falls.   BP Readings from Last 3 Encounters: 05/27/23 : 120/64 04/01/23 : (!) 153/56 02/19/23 : Marland Kitchen 112/52       Past Medical History:  Diagnosis Date   Cancer (HCC)    Seborrheic keratosis, inflamed 10/16/2020   Skin Surgery      Family History  Adopted: Yes  Family history unknown: Yes     Current Outpatient Medications:    atorvastatin (LIPITOR) 10 MG tablet, TAKE 1 TABLET(10 MG) BY MOUTH DAILY, Disp: 90 tablet, Rfl: 1   docusate sodium (COLACE) 100 MG capsule, Take 100 mg by mouth 2 (two) times daily., Disp: , Rfl:    linaclotide (LINZESS) 145 MCG CAPS capsule, Take 1 capsule (145 mcg total) by mouth daily before breakfast., Disp: 90 capsule, Rfl: 1   mirtazapine (REMERON) 7.5 MG tablet, TAKE 1 TABLET(7.5 MG) BY MOUTH AT BEDTIME, Disp: 90 tablet, Rfl: 1   oxybutynin (DITROPAN XL) 10 MG 24 hr tablet, Take 1 tablet (10 mg total) by mouth at bedtime., Disp: 90 tablet, Rfl: 1   diclofenac Sodium (VOLTAREN) 1 % GEL, Apply 2 g topically 4 (four) times daily. (Patient not taking: Reported on 11/05/2022), Disp: 100 g, Rfl: 2   No Known Allergies   Review of Systems  Constitutional: Negative.   Respiratory: Negative.    Cardiovascular: Negative.   Neurological: Negative.  Negative for dizziness and headaches.       Balance problems upon standing.    Psychiatric/Behavioral: Negative.       Today's Vitals   05/27/23 1609  Temp: 98.8 F (37.1 C)  TempSrc: Oral  Weight: 150 lb 3.2 oz (68.1 kg)  Height: 5\' 10"  (1.778 m)  PainSc: 0-No pain   Body mass index is 21.55 kg/m.  Wt Readings from Last 3 Encounters:  05/27/23 150 lb 3.2 oz (68.1 kg)  02/19/23 151 lb (68.5 kg)  02/04/23 154 lb (69.9 kg)    The ASCVD Risk score (Arnett DK, et al., 2019) failed to calculate for the following reasons:   The 2019 ASCVD risk score is only valid for ages 65 to 68  No data found.   Objective:  Physical Exam Vitals reviewed.  Constitutional:      General: He is not in acute distress.    Appearance: Normal appearance.  HENT:     Mouth/Throat:     Dentition: Normal dentition. Does not have dentures.  Pulmonary:     Effort: Pulmonary effort is normal. No respiratory distress.     Breath sounds: Normal breath sounds. No wheezing.  Abdominal:     General: Abdomen is flat. Bowel sounds are normal. There is no distension.     Palpations: Abdomen is soft.     Tenderness: There is no abdominal tenderness.  Musculoskeletal:  General: No swelling, tenderness or deformity. Normal range of motion.     Right lower leg: No edema.     Left lower leg: No edema.     Comments: Gait is slow/steady  Skin:    General: Skin is warm and dry.     Capillary Refill: Capillary refill takes less than 2 seconds.  Neurological:     General: No focal deficit present.     Mental Status: He is alert and oriented to person, place, and time.     Cranial Nerves: No cranial nerve deficit.     Motor: No weakness.  Psychiatric:        Mood and Affect: Mood normal.        Behavior: Behavior normal.        Thought Content: Thought content normal.        Judgment: Judgment normal.         Assessment And Plan:  1. Stumbling gait Comments: will check CT scan of head due to worsening stumbling. No other abnormal findings. Negative orthostats - CT HEAD WO  CONTRAST ( ); Future - Ambulatory referral to Home Health    Return if symptoms worsen or fail to improve.  Patient was given opportunity to ask questions. Patient verbalized understanding of the plan and was able to repeat key elements of the plan. All questions were answered to their satisfaction.  Nathan Felts, FNP  I, Nathan Felts, FNP, have reviewed all documentation for this visit. The documentation on 05/27/23 for the exam, diagnosis, procedures, and orders are all accurate and complete.   IF YOU HAVE BEEN REFERRED TO A SPECIALIST, IT MAY TAKE 1-2 WEEKS TO SCHEDULE/PROCESS THE REFERRAL. IF YOU HAVE NOT HEARD FROM US/SPECIALIST IN TWO WEEKS, PLEASE GIVE [place] A CALL AT [phone].

## 2023-06-03 ENCOUNTER — Encounter: Payer: Self-pay | Admitting: Nurse Practitioner

## 2023-06-05 ENCOUNTER — Encounter: Payer: Self-pay | Admitting: Nurse Practitioner

## 2023-06-12 DIAGNOSIS — N1831 Chronic kidney disease, stage 3a: Secondary | ICD-10-CM | POA: Diagnosis not present

## 2023-06-12 DIAGNOSIS — Z859 Personal history of malignant neoplasm, unspecified: Secondary | ICD-10-CM | POA: Diagnosis not present

## 2023-06-12 DIAGNOSIS — M62838 Other muscle spasm: Secondary | ICD-10-CM | POA: Diagnosis not present

## 2023-06-12 DIAGNOSIS — F17200 Nicotine dependence, unspecified, uncomplicated: Secondary | ICD-10-CM | POA: Diagnosis not present

## 2023-06-12 DIAGNOSIS — K5904 Chronic idiopathic constipation: Secondary | ICD-10-CM | POA: Diagnosis not present

## 2023-06-12 DIAGNOSIS — R634 Abnormal weight loss: Secondary | ICD-10-CM | POA: Diagnosis not present

## 2023-06-12 DIAGNOSIS — Z681 Body mass index (BMI) 19 or less, adult: Secondary | ICD-10-CM | POA: Diagnosis not present

## 2023-06-12 DIAGNOSIS — E559 Vitamin D deficiency, unspecified: Secondary | ICD-10-CM | POA: Diagnosis not present

## 2023-06-12 DIAGNOSIS — R2689 Other abnormalities of gait and mobility: Secondary | ICD-10-CM | POA: Diagnosis not present

## 2023-06-15 ENCOUNTER — Encounter: Payer: Medicare HMO | Admitting: Nurse Practitioner

## 2023-06-16 DIAGNOSIS — R2689 Other abnormalities of gait and mobility: Secondary | ICD-10-CM | POA: Diagnosis not present

## 2023-06-16 DIAGNOSIS — K5904 Chronic idiopathic constipation: Secondary | ICD-10-CM | POA: Diagnosis not present

## 2023-06-16 DIAGNOSIS — R634 Abnormal weight loss: Secondary | ICD-10-CM | POA: Diagnosis not present

## 2023-06-16 DIAGNOSIS — M62838 Other muscle spasm: Secondary | ICD-10-CM | POA: Diagnosis not present

## 2023-06-16 DIAGNOSIS — N1831 Chronic kidney disease, stage 3a: Secondary | ICD-10-CM | POA: Diagnosis not present

## 2023-06-16 DIAGNOSIS — F17200 Nicotine dependence, unspecified, uncomplicated: Secondary | ICD-10-CM | POA: Diagnosis not present

## 2023-06-16 DIAGNOSIS — E559 Vitamin D deficiency, unspecified: Secondary | ICD-10-CM | POA: Diagnosis not present

## 2023-06-16 DIAGNOSIS — Z681 Body mass index (BMI) 19 or less, adult: Secondary | ICD-10-CM | POA: Diagnosis not present

## 2023-06-16 DIAGNOSIS — Z859 Personal history of malignant neoplasm, unspecified: Secondary | ICD-10-CM | POA: Diagnosis not present

## 2023-06-17 ENCOUNTER — Ambulatory Visit
Admission: RE | Admit: 2023-06-17 | Discharge: 2023-06-17 | Disposition: A | Discharge status: Home / Self Care | Payer: BC Managed Care – PPO | Source: Ambulatory Visit | Attending: Nurse Practitioner | Admitting: Nurse Practitioner

## 2023-06-17 DIAGNOSIS — R270 Ataxia, unspecified: Secondary | ICD-10-CM | POA: Diagnosis not present

## 2023-06-17 DIAGNOSIS — R2689 Other abnormalities of gait and mobility: Secondary | ICD-10-CM

## 2023-06-18 DIAGNOSIS — R2689 Other abnormalities of gait and mobility: Secondary | ICD-10-CM | POA: Diagnosis not present

## 2023-06-18 DIAGNOSIS — E559 Vitamin D deficiency, unspecified: Secondary | ICD-10-CM | POA: Diagnosis not present

## 2023-06-18 DIAGNOSIS — M62838 Other muscle spasm: Secondary | ICD-10-CM | POA: Diagnosis not present

## 2023-06-18 DIAGNOSIS — Z859 Personal history of malignant neoplasm, unspecified: Secondary | ICD-10-CM | POA: Diagnosis not present

## 2023-06-18 DIAGNOSIS — K5904 Chronic idiopathic constipation: Secondary | ICD-10-CM | POA: Diagnosis not present

## 2023-06-18 DIAGNOSIS — Z681 Body mass index (BMI) 19 or less, adult: Secondary | ICD-10-CM | POA: Diagnosis not present

## 2023-06-18 DIAGNOSIS — F17200 Nicotine dependence, unspecified, uncomplicated: Secondary | ICD-10-CM | POA: Diagnosis not present

## 2023-06-18 DIAGNOSIS — N1831 Chronic kidney disease, stage 3a: Secondary | ICD-10-CM | POA: Diagnosis not present

## 2023-06-18 DIAGNOSIS — R634 Abnormal weight loss: Secondary | ICD-10-CM | POA: Diagnosis not present

## 2023-06-23 ENCOUNTER — Telehealth: Payer: Medicare HMO

## 2023-06-23 DIAGNOSIS — N1831 Chronic kidney disease, stage 3a: Secondary | ICD-10-CM | POA: Diagnosis not present

## 2023-06-23 DIAGNOSIS — F17200 Nicotine dependence, unspecified, uncomplicated: Secondary | ICD-10-CM | POA: Diagnosis not present

## 2023-06-23 DIAGNOSIS — R634 Abnormal weight loss: Secondary | ICD-10-CM | POA: Diagnosis not present

## 2023-06-23 DIAGNOSIS — R2689 Other abnormalities of gait and mobility: Secondary | ICD-10-CM | POA: Diagnosis not present

## 2023-06-23 DIAGNOSIS — M62838 Other muscle spasm: Secondary | ICD-10-CM | POA: Diagnosis not present

## 2023-06-23 DIAGNOSIS — Z681 Body mass index (BMI) 19 or less, adult: Secondary | ICD-10-CM | POA: Diagnosis not present

## 2023-06-23 DIAGNOSIS — K5904 Chronic idiopathic constipation: Secondary | ICD-10-CM | POA: Diagnosis not present

## 2023-06-23 DIAGNOSIS — Z859 Personal history of malignant neoplasm, unspecified: Secondary | ICD-10-CM | POA: Diagnosis not present

## 2023-06-23 DIAGNOSIS — E559 Vitamin D deficiency, unspecified: Secondary | ICD-10-CM | POA: Diagnosis not present

## 2023-06-24 ENCOUNTER — Telehealth: Payer: Medicare HMO

## 2023-06-24 ENCOUNTER — Telehealth: Payer: Self-pay

## 2023-06-24 NOTE — Telephone Encounter (Signed)
   CCM RN Visit Note   06-24-2023 Name: Nathan Morse MRN: [id number]      DOB: [birth year]  Subjective: Nathan Morse is a 89 y.o. year old male who is a primary care patient of Hassel Neth, FNP. The patient was referred to the Chronic Care Management team for assistance with care management needs subsequent to provider initiation of CCM services and plan of care.      An unsuccessful telephone outreach was attempted today to contact the patient about Chronic Care Management needs.    Plan:A HIPAA compliant phone message was left for the patient providing contact information and requesting a return call.  [name] RN, MSN, CCM RN Care Manager  Chronic Care Management Direct Number: [phone]

## 2023-06-30 DIAGNOSIS — E559 Vitamin D deficiency, unspecified: Secondary | ICD-10-CM | POA: Diagnosis not present

## 2023-06-30 DIAGNOSIS — F17200 Nicotine dependence, unspecified, uncomplicated: Secondary | ICD-10-CM | POA: Diagnosis not present

## 2023-06-30 DIAGNOSIS — K5904 Chronic idiopathic constipation: Secondary | ICD-10-CM | POA: Diagnosis not present

## 2023-06-30 DIAGNOSIS — Z681 Body mass index (BMI) 19 or less, adult: Secondary | ICD-10-CM | POA: Diagnosis not present

## 2023-06-30 DIAGNOSIS — R634 Abnormal weight loss: Secondary | ICD-10-CM | POA: Diagnosis not present

## 2023-06-30 DIAGNOSIS — N1831 Chronic kidney disease, stage 3a: Secondary | ICD-10-CM | POA: Diagnosis not present

## 2023-06-30 DIAGNOSIS — R2689 Other abnormalities of gait and mobility: Secondary | ICD-10-CM | POA: Diagnosis not present

## 2023-06-30 DIAGNOSIS — M62838 Other muscle spasm: Secondary | ICD-10-CM | POA: Diagnosis not present

## 2023-06-30 DIAGNOSIS — Z859 Personal history of malignant neoplasm, unspecified: Secondary | ICD-10-CM | POA: Diagnosis not present

## 2023-07-07 DIAGNOSIS — M62838 Other muscle spasm: Secondary | ICD-10-CM | POA: Diagnosis not present

## 2023-07-07 DIAGNOSIS — N1831 Chronic kidney disease, stage 3a: Secondary | ICD-10-CM | POA: Diagnosis not present

## 2023-07-07 DIAGNOSIS — K5904 Chronic idiopathic constipation: Secondary | ICD-10-CM | POA: Diagnosis not present

## 2023-07-07 DIAGNOSIS — F17200 Nicotine dependence, unspecified, uncomplicated: Secondary | ICD-10-CM | POA: Diagnosis not present

## 2023-07-07 DIAGNOSIS — Z859 Personal history of malignant neoplasm, unspecified: Secondary | ICD-10-CM | POA: Diagnosis not present

## 2023-07-07 DIAGNOSIS — Z681 Body mass index (BMI) 19 or less, adult: Secondary | ICD-10-CM | POA: Diagnosis not present

## 2023-07-07 DIAGNOSIS — E559 Vitamin D deficiency, unspecified: Secondary | ICD-10-CM | POA: Diagnosis not present

## 2023-07-07 DIAGNOSIS — R2689 Other abnormalities of gait and mobility: Secondary | ICD-10-CM | POA: Diagnosis not present

## 2023-07-07 DIAGNOSIS — R634 Abnormal weight loss: Secondary | ICD-10-CM | POA: Diagnosis not present

## 2023-07-16 ENCOUNTER — Other Ambulatory Visit: Payer: Self-pay

## 2023-07-16 ENCOUNTER — Telehealth: Payer: Medicare HMO

## 2023-07-16 ENCOUNTER — Other Ambulatory Visit: Payer: BC Managed Care – PPO

## 2023-07-16 NOTE — Patient Instructions (Signed)
Visit Information  Thank you for taking time to visit with me today. Please don't hesitate to contact me if I can be of assistance to you before our next scheduled telephone appointment.  Following are the goals we discussed today:   Goals Addressed             This Visit's Progress    RNCM Care Coordination Expected Outcome:  Monitor, Self-Manage and Reduce Symptoms of: Aortic Atherosclerosis       Current Barriers:  Chronic Disease Management support and education needs related to effective management of aortic atherosclerosis  Lab Results  Component Value Date   CHOL 154 06/10/2022   HDL 46 06/10/2022   LDLCALC 97 06/10/2022   TRIG 54 06/10/2022   CHOLHDL 3.3 06/10/2022     Planned Interventions: Provider established cholesterol goals reviewed. The patient is at goal. The patient is compliant with the plan of care for effective management of his aortic atherosclerosis. Patient has AWV scheduled for November, reminder given for [facility] and appointment ; Counseled on importance of regular laboratory monitoring as prescribed; Provided HLD/Aortic Atherosclerosis  educational materials; Reviewed role and benefits of statin for ASCVD risk reduction. The patient is compliant with Lipitor. Denies any issues with medications and taking as prescribed. Sets his pills up without any issues. Is independent. States that his memory is not as good as it use to be but he does not forget to take his medications. He denies any issues with medication management; Discussed strategies to manage statin-induced myalgias; Reviewed importance of limiting foods high in cholesterol. Will send educational information on heart healthy diet.States that he is eating well and denies any issues with dietary compliance. States that he eats a lot at the [employer]" usually one meal last him for 2 days. Reviewed exercise goals and target of 150 minutes per week; Screening for signs and symptoms of depression  related to chronic disease state;  Assessed social determinant of health barriers;   Symptom Management: Take medications as prescribed   Attend all scheduled provider appointments Call provider office for new concerns or questions  call the Suicide and Crisis Lifeline: 988 call the [place] National Suicide Prevention Lifeline: (432)282-7741 or TTY: 337 637 8699 TTY 740-876-5192) to talk to a trained counselor call 1-800-273-TALK (toll free, 24 hour hotline) go to [facility] [address] [place] [phone]) if experiencing a Mental Health or Behavioral Health Crisis  - take all medications exactly as prescribed - call doctor with any symptoms you believe are related to your medicine - call doctor when you experience any new symptoms - go to all doctor appointments as scheduled - adhere to prescribed diet: Heart Healthy diet  - develop an exercise routine  Follow Up Plan: Telephone follow up appointment with care management team member scheduled for: 09-17-2023 at 145 pm       RNCM Care Management Expected Outcome:  Monitor, Self-Manage and Reduce Symptoms of: CKD       Current Barriers:  Knowledge Deficits related to changes in bowel habits and the medication he is taking. States that his stools are now sometimes liquid and asking for recommendations Care Coordination needs related to ongoing support and education for effective management of CKD and other chronic conditions in a patient with CKD Chronic Disease Management support and education needs related to effective management of CKD  Planned Interventions: Assessed the patient     understanding of chronic kidney disease. The patient denies any issues in his urinary  function. Did have some concerns with constipation earlier in the year but he is doing well with this currently. He says after eating he goes to the bathroom about 45 minutes later. Is staying hydrated. Denies any acute findings  today. Follows up with the provider on a regular basis.    Evaluation of current treatment plan related to chronic kidney disease self management and patient's adherence to plan as established by provider      Provided education to patient re: stroke prevention, s/s of heart attack and stroke    Reviewed prescribed diet heart healthy Reviewed medications with patient and discussed importance of compliance. States compliance with medications. Denies any needs with his medication management.    Discussed complications of poorly controlled blood pressure such as heart disease, stroke, circulatory complications, vision complications, kidney impairment, sexual dysfunction    Reviewed scheduled/upcoming provider appointments including: 11-11-2023- reminder given today    Advised patient to discuss changes in kidney function, questions, or concerns with provider    Discussed plans with patient for ongoing care management follow up and provided patient with direct contact information for care management team    Screening for signs and symptoms of depression related to chronic disease state      Discussed the impact of chronic kidney disease on daily life and mental health and acknowledged and normalized feelings of disempowerment, fear, and frustration    Assessed social determinant of health barriers    Provided education on kidney disease progression    The patient did have a questions about the linzess he is taking. He states he is not having issues with constipation but now his stools are loose and he sometimes thinks he might be "wet". Encouraged the patient to call the office if he notices diarrhea. He denies new concerns and states the medications he takes is working well.  Engage patient in early, proactive and ongoing discussion about goals of care and what matters most to them    Support coping and stress management by recognizing current strategies and assist in developing new strategies such as  mindfulness, journaling, relaxation techniques, problem-solving     Symptom Management: Take medications as prescribed   Attend all scheduled provider appointments Call provider office for new concerns or questions  call the Suicide and Crisis Lifeline: 988 call the [place] National Suicide Prevention Lifeline: 828-583-3498 or TTY: (503) 189-8533 TTY (574)276-8532) to talk to a trained counselor call 1-800-273-TALK (toll free, 24 hour hotline) go to [facility] [address] [place] [phone]) if experiencing a Mental Health or Behavioral Health Crisis   Follow Up Plan: Telephone follow up appointment with care management team member scheduled for: 09-17-2023 at 145 pm           Our next appointment is by telephone on 09-17-2023  at 145 pm  Please call the care guide team at [phone] if you need to cancel or reschedule your appointment.   If you are experiencing a Mental Health or Behavioral Health Crisis or need someone to talk to, please call the Suicide and Crisis Lifeline: 988 call the [place] National Suicide Prevention Lifeline: (909) 045-2558 or TTY: (412) 686-0469 TTY 9108305420) to talk to a trained counselor call 1-800-273-TALK (toll free, 24 hour hotline) go to [facility] [address] [place] [phone])   The patient verbalized understanding of instructions, educational materials, and care plan provided today and DECLINED offer to receive copy of patient instructions, educational materials, and care plan.  [name] RN, MSN, CCM RN Care Manager  [facility]  Ambulatory Care Management  Direct Number: [phone]

## 2023-07-16 NOTE — Patient Outreach (Signed)
Care Management   Visit Note  07/16/2023 Name: Nathan Morse MRN: [id number] DOB: [birth year]  Subjective: Nathan Morse is a 89 y.o. year old male who is a primary care patient of [name], FNP. The Care Management team was consulted for assistance.      Engaged with patient Engaged with patient by telephone.   Assessment:   Goals Addressed             This Visit's Progress    RNCM Care Coordination Expected Outcome:  Monitor, Self-Manage and Reduce Symptoms of: Aortic Atherosclerosis       Current Barriers:  Chronic Disease Management support and education needs related to effective management of aortic atherosclerosis  Lab Results  Component Value Date   CHOL 154 06/10/2022   HDL 46 06/10/2022   LDLCALC 97 06/10/2022   TRIG 54 06/10/2022   CHOLHDL 3.3 06/10/2022     Planned Interventions: Provider established cholesterol goals reviewed. The patient is at goal. The patient is compliant with the plan of care for effective management of his aortic atherosclerosis. Patient has AWV scheduled for November, reminder given for [facility] and appointment ; Counseled on importance of regular laboratory monitoring as prescribed; Provided HLD/Aortic Atherosclerosis  educational materials; Reviewed role and benefits of statin for ASCVD risk reduction. The patient is compliant with Lipitor. Denies any issues with medications and taking as prescribed. Sets his pills up without any issues. Is independent. States that his memory is not as good as it use to be but he does not forget to take his medications. He denies any issues with medication management; Discussed strategies to manage statin-induced myalgias; Reviewed importance of limiting foods high in cholesterol. Will send educational information on heart healthy diet.States that he is eating well and denies any issues with dietary compliance. States that he eats a lot at the [employer]" usually one meal last him for 2 days. Reviewed  exercise goals and target of 150 minutes per week; Screening for signs and symptoms of depression related to chronic disease state;  Assessed social determinant of health barriers;   Symptom Management: Take medications as prescribed   Attend all scheduled provider appointments Call provider office for new concerns or questions  call the Suicide and Crisis Lifeline: 988 call the [place] National Suicide Prevention Lifeline: (906)259-0041 or TTY: (650)316-0746 TTY 226-289-1655) to talk to a trained counselor call 1-800-273-TALK (toll free, 24 hour hotline) go to [facility] [address], [place] [phone]) if experiencing a Mental Health or Behavioral Health Crisis  - take all medications exactly as prescribed - call doctor with any symptoms you believe are related to your medicine - call doctor when you experience any new symptoms - go to all doctor appointments as scheduled - adhere to prescribed diet: Heart Healthy diet  - develop an exercise routine  Follow Up Plan: Telephone follow up appointment with care management team member scheduled for: 09-17-2023 at 145 pm       RNCM Care Management Expected Outcome:  Monitor, Self-Manage and Reduce Symptoms of: CKD       Current Barriers:  Knowledge Deficits related to changes in bowel habits and the medication he is taking. States that his stools are now sometimes liquid and asking for recommendations Care Coordination needs related to ongoing support and education for effective management of CKD and other chronic conditions in a patient with CKD Chronic Disease Management support and education needs related to effective management of CKD  Planned Interventions: Assessed  the patient     understanding of chronic kidney disease. The patient denies any issues in his urinary function. Did have some concerns with constipation earlier in the year but he is doing well with this currently. He says after  eating he goes to the bathroom about 45 minutes later. Is staying hydrated. Denies any acute findings today. Follows up with the provider on a regular basis.    Evaluation of current treatment plan related to chronic kidney disease self management and patient's adherence to plan as established by provider      Provided education to patient re: stroke prevention, s/s of heart attack and stroke    Reviewed prescribed diet heart healthy Reviewed medications with patient and discussed importance of compliance. States compliance with medications. Denies any needs with his medication management.    Discussed complications of poorly controlled blood pressure such as heart disease, stroke, circulatory complications, vision complications, kidney impairment, sexual dysfunction    Reviewed scheduled/upcoming provider appointments including: 11-11-2023- reminder given today    Advised patient to discuss changes in kidney function, questions, or concerns with provider    Discussed plans with patient for ongoing care management follow up and provided patient with direct contact information for care management team    Screening for signs and symptoms of depression related to chronic disease state      Discussed the impact of chronic kidney disease on daily life and mental health and acknowledged and normalized feelings of disempowerment, fear, and frustration    Assessed social determinant of health barriers    Provided education on kidney disease progression    The patient did have a questions about the linzess he is taking. He states he is not having issues with constipation but now his stools are loose and he sometimes thinks he might be "wet". Encouraged the patient to call the office if he notices diarrhea. He denies new concerns and states the medications he takes is working well.  Engage patient in early, proactive and ongoing discussion about goals of care and what matters most to them    Support coping and  stress management by recognizing current strategies and assist in developing new strategies such as mindfulness, journaling, relaxation techniques, problem-solving     Symptom Management: Take medications as prescribed   Attend all scheduled provider appointments Call provider office for new concerns or questions  call the Suicide and Crisis Lifeline: 988 call the [place] National Suicide Prevention Lifeline: (973) 486-7917 or TTY: (513) 325-3921 TTY (862)459-1853) to talk to a trained counselor call 1-800-273-TALK (toll free, 24 hour hotline) go to [facility] [address], [place] [phone]) if experiencing a Mental Health or Behavioral Health Crisis   Follow Up Plan: Telephone follow up appointment with care management team member scheduled for: 09-17-2023 at 145 pm            Consent to Services:  Patient was given information about care management services, agreed to services, and gave verbal consent to participate.   Plan: Telephone follow up appointment with care management team member scheduled for: 09-17-2023 at 145 pm  [name] RN, MSN, CCM RN Care Manager  [facility]  Ambulatory Care Management  Direct Number: [phone]

## 2023-07-21 DIAGNOSIS — M62838 Other muscle spasm: Secondary | ICD-10-CM | POA: Diagnosis not present

## 2023-07-21 DIAGNOSIS — N1831 Chronic kidney disease, stage 3a: Secondary | ICD-10-CM | POA: Diagnosis not present

## 2023-07-21 DIAGNOSIS — K5904 Chronic idiopathic constipation: Secondary | ICD-10-CM | POA: Diagnosis not present

## 2023-07-21 DIAGNOSIS — R2689 Other abnormalities of gait and mobility: Secondary | ICD-10-CM | POA: Diagnosis not present

## 2023-07-21 DIAGNOSIS — Z681 Body mass index (BMI) 19 or less, adult: Secondary | ICD-10-CM | POA: Diagnosis not present

## 2023-07-21 DIAGNOSIS — R634 Abnormal weight loss: Secondary | ICD-10-CM | POA: Diagnosis not present

## 2023-07-21 DIAGNOSIS — F17200 Nicotine dependence, unspecified, uncomplicated: Secondary | ICD-10-CM | POA: Diagnosis not present

## 2023-07-21 DIAGNOSIS — E559 Vitamin D deficiency, unspecified: Secondary | ICD-10-CM | POA: Diagnosis not present

## 2023-07-21 DIAGNOSIS — Z859 Personal history of malignant neoplasm, unspecified: Secondary | ICD-10-CM | POA: Diagnosis not present

## 2023-08-07 DIAGNOSIS — N1831 Chronic kidney disease, stage 3a: Secondary | ICD-10-CM | POA: Diagnosis not present

## 2023-08-07 DIAGNOSIS — Z681 Body mass index (BMI) 19 or less, adult: Secondary | ICD-10-CM | POA: Diagnosis not present

## 2023-08-07 DIAGNOSIS — K5904 Chronic idiopathic constipation: Secondary | ICD-10-CM | POA: Diagnosis not present

## 2023-08-07 DIAGNOSIS — F17200 Nicotine dependence, unspecified, uncomplicated: Secondary | ICD-10-CM | POA: Diagnosis not present

## 2023-08-07 DIAGNOSIS — R634 Abnormal weight loss: Secondary | ICD-10-CM | POA: Diagnosis not present

## 2023-08-07 DIAGNOSIS — E559 Vitamin D deficiency, unspecified: Secondary | ICD-10-CM | POA: Diagnosis not present

## 2023-08-07 DIAGNOSIS — M62838 Other muscle spasm: Secondary | ICD-10-CM | POA: Diagnosis not present

## 2023-08-07 DIAGNOSIS — Z859 Personal history of malignant neoplasm, unspecified: Secondary | ICD-10-CM | POA: Diagnosis not present

## 2023-08-07 DIAGNOSIS — R2689 Other abnormalities of gait and mobility: Secondary | ICD-10-CM | POA: Diagnosis not present

## 2023-09-02 ENCOUNTER — Ambulatory Visit: Payer: Medicare HMO | Admitting: Podiatry

## 2023-09-02 ENCOUNTER — Encounter: Payer: Self-pay | Admitting: Podiatry

## 2023-09-02 DIAGNOSIS — M79674 Pain in right toe(s): Secondary | ICD-10-CM

## 2023-09-02 DIAGNOSIS — L84 Corns and callosities: Secondary | ICD-10-CM | POA: Diagnosis not present

## 2023-09-02 DIAGNOSIS — B351 Tinea unguium: Secondary | ICD-10-CM

## 2023-09-02 DIAGNOSIS — M79675 Pain in left toe(s): Secondary | ICD-10-CM

## 2023-09-02 NOTE — Progress Notes (Signed)
Subjective:  Patient ID: Nathan Morse, male    DOB: 1934-08-03,  MRN: [id number]  Nathan Morse presents to clinic today for: at risk foot care with h/o CKD and corn(s) of both feet, callus(es) of both feet and painful mycotic nails.  Pain interferes with ambulation. Aggravating factors include wearing enclosed shoe gear. Painful toenails interfere with ambulation. Aggravating factors include wearing enclosed shoe gear. Pain is relieved with periodic professional debridement. Painful corns and calluses are aggravated when weightbearing with and without shoegear. Pain is relieved with periodic professional debridement.   PCP is [name], FNP.  No Known Allergies  Review of Systems: Negative except as noted in the HPI.  Objective: No changes noted in today's physical examination. There were no vitals filed for this visit.  Nathan Morse is a pleasant 89 y.o. male in NAD. AAO x 3.  Vascular Examination: Capillary refill time <3 seconds b/l LE. Palpable pedal pulses b/l LE. Digital hair sparse b/l. No pedal edema b/l. Skin temperature gradient WNL b/l. No varicosities b/l. Marland Kitchen  Dermatological Examination: Pedal skin with normal turgor, texture and tone b/l. No open wounds. No interdigital macerations b/l. Toenails 1-5 b/l thickened, discolored, dystrophic with subungual debris. There is pain on palpation to dorsal aspect of nailplates. Hyperkeratotic lesion(s) L 5th toe, R 5th toe, and submet head 5 left foot.  No erythema, no edema, no drainage, no fluctuance..  Neurological Examination: Protective sensation intact with 10 gram monofilament b/l LE. Vibratory sensation intact b/l LE.   Musculoskeletal Examination: Muscle strength 5/5 to all lower extremity muscle groups bilaterally. Hammertoe(s) noted to the L 5th toe and R 5th toe.  Assessment/Plan: 1. Pain due to onychomycosis of toenails of both feet   2. Corns and callosities   3. Pain in toes of both feet     -Patient was evaluated  and treated. All patient's and/or POA's questions/concerns answered on today's visit. -Patient to continue soft, supportive shoe gear daily. -Mycotic toenails 1-5 bilaterally were debrided in length and girth with sterile nail nippers and dremel without incident. -Corn(s) L 5th toe and R 5th toe pared utilizing sterile scalpel blade without complication or incident. Total number debrided=2. -Callus(es) submet head 5 left foot pared utilizing sterile scalpel blade without complication or incident. Total number debrided =1. -Patient/POA to call should there be question/concern in the interim.   Return in about 3 months (around 12/02/2023).  Nathan Morse, DPM

## 2023-09-17 ENCOUNTER — Telehealth: Payer: Self-pay

## 2023-09-17 ENCOUNTER — Other Ambulatory Visit: Payer: BC Managed Care – PPO

## 2023-09-17 NOTE — Patient Outreach (Signed)
Care Management   Follow Up Note   09/17/2023 Name: Nathan Morse MRN: [id number] DOB: July 04, 1934   Referred by: [name], FNP Reason for referral : Care Management (RNCM: Follow up for Chronic Disease Management and Care Coordination Needs- attempt)   An unsuccessful telephone outreach was attempted today. The patient was referred to the case management team for assistance with care management and care coordination.   Follow Up Plan: A HIPPA compliant phone message was left for the patient providing contact information and requesting a return call.   [name] RN, MSN, CCM RN Care Manager  [facility]  Ambulatory Care Management  Direct Number: [phone]

## 2023-10-09 ENCOUNTER — Other Ambulatory Visit: Payer: Self-pay

## 2023-10-09 ENCOUNTER — Other Ambulatory Visit: Payer: Self-pay | Admitting: *Deleted

## 2023-10-09 NOTE — Patient Outreach (Signed)
Care Management   Visit Note  10/09/2023 Name: Nathan Morse MRN: [id number] DOB: Apr 25, 1934  Subjective: Nathan Morse is a 89 y.o. year old male who is a primary care patient of Nathan Felts, FNP. The Care Management team was consulted for assistance.      Engaged with patient spoke with patient by telephone.    Goals Addressed             This Visit's Progress    RNCM Care Coordination Expected Outcome:  Monitor, Self-Manage and Reduce Symptoms of: Aortic Atherosclerosis       Current Barriers:  Chronic Disease Management support and education needs related to effective management of aortic atherosclerosis  Lab Results  Component Value Date   CHOL 154 06/10/2022   HDL 46 06/10/2022   LDLCALC 97 06/10/2022   TRIG 54 06/10/2022   CHOLHDL 3.3 06/10/2022     Planned Interventions: Provider established cholesterol goals reviewed. The patient is at goal. The patient is compliant with the plan of care for effective management of his aortic atherosclerosis.  Counseled on importance of regular laboratory monitoring as prescribed; Provided HLD/Aortic Atherosclerosis  educational materials; Reviewed role and benefits of statin for ASCVD risk reduction. The patient is compliant with Lipitor. Denies any issues with medications and taking as prescribed. Sets his pills up without any issues. Is independent. States that his memory is not as good as it use to be but he does not forget to take his medications. He denies any issues with medication management; Discussed strategies to manage statin-induced myalgias; Reviewed importance of limiting foods high in cholesterol. States that he eats a lot at the [employer]" and he consumes rice, pinto beans and fish. Reviewed exercise goals and target of 150 minutes per week; Screening for signs and symptoms of depression related to chronic disease state;  Assessed social determinant of health barriers;   Symptom Management: Take medications as  prescribed   Attend all scheduled provider appointments Call provider office for new concerns or questions  call the Suicide and Crisis Lifeline: 988 call the [place] National Suicide Prevention Lifeline: 214-204-9807 or TTY: 220-182-1519 TTY [phone]) to talk to a trained counselor call 1-800-273-TALK (toll free, 24 hour hotline) go to [facility] [address], [place] [phone]) if experiencing a Mental Health or Behavioral Health Crisis  - take all medications exactly as prescribed - call doctor with any symptoms you believe are related to your medicine - call doctor when you experience any new symptoms - go to all doctor appointments as scheduled - adhere to prescribed diet: Heart Healthy diet  - develop an exercise routine  Follow Up Plan: Telephone follow up appointment with care management team member scheduled for: 11-27-2023 at 145 pm       RNCM Care Management Expected Outcome:  Monitor, Self-Manage and Reduce Symptoms of: CKD       Current Barriers:  Knowledge Deficits related to changes in bowel habits and the medication he is taking. States that his stools are now sometimes liquid and asking for recommendations Care Coordination needs related to ongoing support and education for effective management of CKD and other chronic conditions in a patient with CKD Chronic Disease Management support and education needs related to effective management of CKD  Planned Interventions: Assessed the patient     understanding of chronic kidney disease. The patient denies any issues in his urinary function. Denies any acute findings today. Follows up with the provider on a regular basis.  Evaluation of current treatment plan related to chronic kidney disease self management and patient's adherence to plan as established by provider      Provided education to patient re: stroke prevention, s/s of heart attack and stroke    Reviewed prescribed  diet heart healthy Reviewed medications with patient and discussed importance of compliance. States compliance with medications. Denies any needs with his medication management.    Discussed complications of poorly controlled blood pressure such as heart disease, stroke, circulatory complications, vision complications, kidney impairment, sexual dysfunction    Reviewed scheduled/upcoming provider appointments including: 11-11-2023- reminder given today    Advised patient to discuss changes in kidney function, questions, or concerns with provider    Discussed plans with patient for ongoing care management follow up and provided patient with direct contact information for care management team    Screening for signs and symptoms of depression related to chronic disease state      Discussed the impact of chronic kidney disease on daily life and mental health and acknowledged and normalized feelings of disempowerment, fear, and frustration    Assessed social determinant of health barriers    Provided education on kidney disease progression    The patient states that he has ran out of refills for his Linzess and has to follow up with his PCP before they will refill the medication. He is grateful that he is not having any issues with constipation at this time.  Engage patient in early, proactive and ongoing discussion about goals of care and what matters most to them    Support coping and stress management by recognizing current strategies and assist in developing new strategies such as mindfulness, journaling, relaxation techniques, problem-solving     Symptom Management: Take medications as prescribed   Attend all scheduled provider appointments Call provider office for new concerns or questions  call the Suicide and Crisis Lifeline: 988 call the [place] National Suicide Prevention Lifeline: 848-406-3627 or TTY: 5511453161 TTY 4456647590) to talk to a trained counselor call 1-800-273-TALK (toll  free, 24 hour hotline) go to [facility] [address], [place] [phone]) if experiencing a Mental Health or Behavioral Health Crisis   Follow Up Plan: Telephone follow up appointment with care management team member scheduled for: 11-27-2023 at 145 pm              Consent to Services:  Patient was given information about care management services, agreed to services, and gave verbal consent to participate.   Plan: Telephone follow up appointment with care management team member scheduled for: 11-27-2023 at 1:45 pm  Danise Edge, BSN RN RN Care Manager  [facility]  Ambulatory Care Management  Direct Number: [phone]

## 2023-10-09 NOTE — Patient Instructions (Signed)
Visit Information  Thank you for taking time to visit with me today. Please don't hesitate to contact me if I can be of assistance to you before our next scheduled telephone appointment.  Following are the goals we discussed today:   Goals Addressed             This Visit's Progress    RNCM Care Coordination Expected Outcome:  Monitor, Self-Manage and Reduce Symptoms of: Aortic Atherosclerosis       Current Barriers:  Chronic Disease Management support and education needs related to effective management of aortic atherosclerosis  Lab Results  Component Value Date   CHOL 154 06/10/2022   HDL 46 06/10/2022   LDLCALC 97 06/10/2022   TRIG 54 06/10/2022   CHOLHDL 3.3 06/10/2022     Planned Interventions: Provider established cholesterol goals reviewed. The patient is at goal. The patient is compliant with the plan of care for effective management of his aortic atherosclerosis.  Counseled on importance of regular laboratory monitoring as prescribed; Provided HLD/Aortic Atherosclerosis  educational materials; Reviewed role and benefits of statin for ASCVD risk reduction. The patient is compliant with Lipitor. Denies any issues with medications and taking as prescribed. Sets his pills up without any issues. Is independent. States that his memory is not as good as it use to be but he does not forget to take his medications. He denies any issues with medication management; Discussed strategies to manage statin-induced myalgias; Reviewed importance of limiting foods high in cholesterol. States that he eats a lot at the [employer]" and he consumes rice, pinto beans and fish. Reviewed exercise goals and target of 150 minutes per week; Screening for signs and symptoms of depression related to chronic disease state;  Assessed social determinant of health barriers;   Symptom Management: Take medications as prescribed   Attend all scheduled provider appointments Call provider office for new  concerns or questions  call the Suicide and Crisis Lifeline: 988 call the [place] National Suicide Prevention Lifeline: 410-842-7116 or TTY: 541-703-5124 TTY 8658856674) to talk to a trained counselor call 1-800-273-TALK (toll free, 24 hour hotline) go to [facility] [address] [place] [phone]) if experiencing a Mental Health or Behavioral Health Crisis  - take all medications exactly as prescribed - call doctor with any symptoms you believe are related to your medicine - call doctor when you experience any new symptoms - go to all doctor appointments as scheduled - adhere to prescribed diet: Heart Healthy diet  - develop an exercise routine  Follow Up Plan: Telephone follow up appointment with care management team member scheduled for: 11-27-2023 at 145 pm       RNCM Care Management Expected Outcome:  Monitor, Self-Manage and Reduce Symptoms of: CKD       Current Barriers:  Knowledge Deficits related to changes in bowel habits and the medication he is taking. States that his stools are now sometimes liquid and asking for recommendations Care Coordination needs related to ongoing support and education for effective management of CKD and other chronic conditions in a patient with CKD Chronic Disease Management support and education needs related to effective management of CKD  Planned Interventions: Assessed the patient     understanding of chronic kidney disease. The patient denies any issues in his urinary function. Denies any acute findings today. Follows up with the provider on a regular basis.    Evaluation of current treatment plan related to chronic kidney disease self management and patient's  adherence to plan as established by provider      Provided education to patient re: stroke prevention, s/s of heart attack and stroke    Reviewed prescribed diet heart healthy Reviewed medications with patient and discussed importance of  compliance. States compliance with medications. Denies any needs with his medication management.    Discussed complications of poorly controlled blood pressure such as heart disease, stroke, circulatory complications, vision complications, kidney impairment, sexual dysfunction    Reviewed scheduled/upcoming provider appointments including: 11-11-2023- reminder given today    Advised patient to discuss changes in kidney function, questions, or concerns with provider    Discussed plans with patient for ongoing care management follow up and provided patient with direct contact information for care management team    Screening for signs and symptoms of depression related to chronic disease state      Discussed the impact of chronic kidney disease on daily life and mental health and acknowledged and normalized feelings of disempowerment, fear, and frustration    Assessed social determinant of health barriers    Provided education on kidney disease progression    The patient states that he has ran out of refills for his Linzess and has to follow up with his PCP before they will refill the medication. He is grateful that he is not having any issues with constipation at this time.  Engage patient in early, proactive and ongoing discussion about goals of care and what matters most to them    Support coping and stress management by recognizing current strategies and assist in developing new strategies such as mindfulness, journaling, relaxation techniques, problem-solving     Symptom Management: Take medications as prescribed   Attend all scheduled provider appointments Call provider office for new concerns or questions  call the Suicide and Crisis Lifeline: 988 call the [place] National Suicide Prevention Lifeline: 480-039-2973 or TTY: (906) 182-2928 TTY 319 601 2056) to talk to a trained counselor call 1-800-273-TALK (toll free, 24 hour hotline) go to [facility] [address], [place] [phone]) if experiencing a Mental Health or Behavioral Health Crisis   Follow Up Plan: Telephone follow up appointment with care management team member scheduled for: 11-27-2023 at 145 pm           Our next appointment is by telephone on 11-27-2023 at 1:45 pm  Please call the care guide team at [phone] if you need to cancel or reschedule your appointment.   If you are experiencing a Mental Health or Behavioral Health Crisis or need someone to talk to, please call the Suicide and Crisis Lifeline: 988 call the [place] National Suicide Prevention Lifeline: 573 049 9681 or TTY: 402-550-1858 TTY 662-065-2523) to talk to a trained counselor call 1-800-273-TALK (toll free, 24 hour hotline) go to [facility] [address], [place] [phone]) call 911   The patient verbalized understanding of instructions, educational materials, and care plan provided today and DECLINED offer to receive copy of patient instructions, educational materials, and care plan.   Telephone follow up appointment with care management team member scheduled for:11-27-2023 at 1:45 pm  Danise Edge, BSN RN RN Care Manager  [facility]  Ambulatory Care Management  Direct Number: [phone]

## 2023-10-14 ENCOUNTER — Other Ambulatory Visit: Payer: Self-pay

## 2023-10-14 DIAGNOSIS — I700 Atherosclerosis of aorta: Secondary | ICD-10-CM

## 2023-10-14 DIAGNOSIS — K5904 Chronic idiopathic constipation: Secondary | ICD-10-CM

## 2023-10-14 MED ORDER — ATORVASTATIN CALCIUM 10 MG PO TABS
ORAL_TABLET | ORAL | 1 refills | Status: DC
Start: 2023-10-14 — End: 2024-07-04

## 2023-10-14 MED ORDER — LINACLOTIDE 145 MCG PO CAPS
145.0000 ug | ORAL_CAPSULE | Freq: Every day | ORAL | 1 refills | Status: AC
Start: 2023-10-14 — End: ?

## 2023-11-11 ENCOUNTER — Encounter: Payer: Self-pay | Admitting: Nurse Practitioner

## 2023-11-11 ENCOUNTER — Ambulatory Visit: Payer: Medicare HMO | Admitting: Nurse Practitioner

## 2023-11-11 VITALS — BP 110/74 | HR 59 | Temp 97.5°F | Ht 70.0 in | Wt 165.2 lb

## 2023-11-11 DIAGNOSIS — N1831 Chronic kidney disease, stage 3a: Secondary | ICD-10-CM | POA: Diagnosis not present

## 2023-11-11 DIAGNOSIS — I700 Atherosclerosis of aorta: Secondary | ICD-10-CM | POA: Diagnosis not present

## 2023-11-11 DIAGNOSIS — Z79899 Other long term (current) drug therapy: Secondary | ICD-10-CM

## 2023-11-11 DIAGNOSIS — R634 Abnormal weight loss: Secondary | ICD-10-CM | POA: Diagnosis not present

## 2023-11-11 DIAGNOSIS — Z2821 Immunization not carried out because of patient refusal: Secondary | ICD-10-CM

## 2023-11-11 DIAGNOSIS — R5383 Other fatigue: Secondary | ICD-10-CM | POA: Diagnosis not present

## 2023-11-11 DIAGNOSIS — E559 Vitamin D deficiency, unspecified: Secondary | ICD-10-CM

## 2023-11-11 DIAGNOSIS — Z8546 Personal history of malignant neoplasm of prostate: Secondary | ICD-10-CM

## 2023-11-11 MED ORDER — MIRTAZAPINE 7.5 MG PO TABS
ORAL_TABLET | ORAL | 1 refills | Status: DC
Start: 2023-11-11 — End: 2024-06-28

## 2023-11-11 NOTE — Assessment & Plan Note (Signed)
Will check for metabolic cause.

## 2023-11-11 NOTE — Assessment & Plan Note (Signed)

## 2023-11-11 NOTE — Assessment & Plan Note (Signed)
He has gained approximately 15 lbs since his last visit.

## 2023-11-11 NOTE — Assessment & Plan Note (Signed)
Previous history of prostate cancer approximately 11 years ago, I have given him the number to [facility] because he has questions about his penile pump. I will also check a PSA.

## 2023-11-11 NOTE — Assessment & Plan Note (Signed)
Will check eGFR. Encouraged to limit intake of NSAIDs

## 2023-11-11 NOTE — Progress Notes (Signed)
Madelaine Bhat, CMA,acting as a Neurosurgeon for [name], FNP.,have documented all relevant documentation on the behalf of [name], FNP,as directed by  [name], FNP while in the presence of [name], FNP.  Subjective:  Patient ID: Nathan Morse , male    DOB: 05-09-1934 , 89 y.o.   MRN: [id number]  No chief complaint on file.   HPI  Patient presents today for low energy. Patient reports he hasn't been feeling well he feels like "nothing". Patient reports he can no longer hold his urine, he reports when he has to go he has to go right then he sometimes doesn't make it.      Past Medical History:  Diagnosis Date   Cancer [facility])    Muscle spasm 05/19/2019   Seborrheic keratosis, inflamed 10/16/2020   Skin Surgery    Urinary tract infection with hematuria 03/22/2019     Family History  Adopted: Yes  Family history unknown: Yes     Current Outpatient Medications:    atorvastatin (LIPITOR) 10 MG tablet, TAKE 1 TABLET(10 MG) BY MOUTH DAILY, Disp: 90 tablet, Rfl: 1   linaclotide (LINZESS) 145 MCG CAPS capsule, Take 1 capsule (145 mcg total) by mouth daily before breakfast., Disp: 90 capsule, Rfl: 1   oxybutynin (DITROPAN XL) 10 MG 24 hr tablet, Take 1 tablet (10 mg total) by mouth at bedtime., Disp: 90 tablet, Rfl: 1   diclofenac Sodium (VOLTAREN) 1 % GEL, Apply 2 g topically 4 (four) times daily. (Patient not taking: Reported on 11/05/2022), Disp: 100 g, Rfl: 2   docusate sodium (COLACE) 100 MG capsule, Take 100 mg by mouth 2 (two) times daily. (Patient not taking: Reported on 11/11/2023), Disp: , Rfl:    mirtazapine (REMERON) 7.5 MG tablet, TAKE 1 TABLET(7.5 MG) BY MOUTH AT BEDTIME, Disp: 90 tablet, Rfl: 1   No Known Allergies   Review of Systems  Constitutional: Negative.   Respiratory: Negative.    Cardiovascular: Negative.   Neurological: Negative.   Psychiatric/Behavioral: Negative.       Today's Vitals   11/11/23 1033  BP: 110/74  Pulse: (!) 59  Temp: (!)  97.5 F (36.4 C)  TempSrc: Oral  Weight: 165 lb 3.2 oz (74.9 kg)  Height: 5\' 10"  (1.778 m)  PainSc: 0-No pain   Body mass index is 23.7 kg/m.  Wt Readings from Last 3 Encounters:  11/11/23 165 lb 3.2 oz (74.9 kg)  05/27/23 150 lb 3.2 oz (68.1 kg)  02/19/23 151 lb (68.5 kg)     Objective:  Physical Exam Vitals reviewed.  Constitutional:      General: He is not in acute distress.    Appearance: Normal appearance.  HENT:     Mouth/Throat:     Dentition: Normal dentition. Does not have dentures.  Cardiovascular:     Rate and Rhythm: Normal rate and regular rhythm.     Pulses: Normal pulses.     Heart sounds: Normal heart sounds. No murmur heard. Pulmonary:     Effort: Pulmonary effort is normal. No respiratory distress.     Breath sounds: Normal breath sounds. No wheezing.  Abdominal:     General: Abdomen is flat. Bowel sounds are normal. There is no distension.     Palpations: Abdomen is soft.     Tenderness: There is no abdominal tenderness.  Skin:    General: Skin is warm and dry.     Capillary Refill: Capillary refill takes less than 2 seconds.  Neurological:  General: No focal deficit present.     Mental Status: He is alert and oriented to person, place, and time.     Cranial Nerves: No cranial nerve deficit.     Motor: No weakness.  Psychiatric:        Mood and Affect: Mood normal.        Behavior: Behavior normal.        Thought Content: Thought content normal.        Judgment: Judgment normal.         Assessment And Plan:  Low energy Assessment & Plan: Will check for metabolic cause   Orders: -     Vitamin B12 -     Iron, TIBC and Ferritin Panel  Other fatigue Assessment & Plan: Will check for metabolic causes.   Orders: -     Vitamin B12 -     Iron, TIBC and Ferritin Panel  Unintended weight loss Assessment & Plan: He has gained approximately 15 lbs since his last visit.   Orders: -     Mirtazapine; TAKE 1 TABLET(7.5 MG) BY MOUTH AT  BEDTIME  Dispense: 90 tablet; Refill: 1  Vitamin D deficiency Assessment & Plan: Will check vitamin D level and supplement as needed.    Also encouraged to spend 15 minutes in the sun daily.    Orders: -     VITAMIN D 25 Hydroxy (Vit-D Deficiency, Fractures)  Stage 3a chronic kidney disease (HCC) Assessment & Plan: Will check eGFR. Encouraged to limit intake of NSAIDs   Aortic atherosclerosis (HCC)  COVID-19 vaccination declined Assessment & Plan: Declines covid 19 vaccine. Discussed risk of covid 73 and if he changes her mind about the vaccine to call the office. Education has been provided regarding the importance of this vaccine but patient still declined. Advised may receive this vaccine at local pharmacy or Health Dept.or vaccine clinic. Aware to provide a copy of the vaccination record if obtained from local pharmacy or Health Dept.  Encouraged to take multivitamin, vitamin d, vitamin c and zinc to increase immune system. Aware can call office if would like to have vaccine here at office. Verbalized acceptance and understanding.     Other long term (current) drug therapy -     CBC with Differential/Platelet  History of prostate cancer Assessment & Plan: Previous history of prostate cancer approximately 11 years ago, I have given him the number to [facility] because he has questions about his penile pump. I will also check a PSA.   Orders: -     PSA   Return in about 5 months (around 04/10/2024).  Patient was given opportunity to ask questions. Patient verbalized understanding of the plan and was able to repeat key elements of the plan. All questions were answered to their satisfaction.    Jeanell Sparrow, FNP, have reviewed all documentation for this visit. The documentation on 11/11/23 for the exam, diagnosis, procedures, and orders are all accurate and complete.   IF YOU HAVE BEEN REFERRED TO A SPECIALIST, IT MAY TAKE 1-2 WEEKS TO SCHEDULE/PROCESS THE REFERRAL.  IF YOU HAVE NOT HEARD FROM US/SPECIALIST IN TWO WEEKS, PLEASE GIVE [place] A CALL AT [phone].

## 2023-11-11 NOTE — Patient Instructions (Addendum)
[facility] [phone] - call him about your concerns on your history of prostate cancer

## 2023-11-11 NOTE — Assessment & Plan Note (Signed)
Will check vitamin D level and supplement as needed.    Also encouraged to spend 15 minutes in the sun daily.   

## 2023-11-11 NOTE — Assessment & Plan Note (Signed)
Will check for metabolic causes.

## 2023-11-12 LAB — VITAMIN D 25 HYDROXY (VIT D DEFICIENCY, FRACTURES): Vit D, 25-Hydroxy: 31.2 ng/mL (ref 30.0–100.0)

## 2023-11-12 LAB — CBC WITH DIFFERENTIAL/PLATELET
Basophils Absolute: 0 10*3/uL (ref 0.0–0.2)
Basos: 0 %
EOS (ABSOLUTE): 0 10*3/uL (ref 0.0–0.4)
Eos: 1 %
Hematocrit: 41.9 % (ref 37.5–51.0)
Hemoglobin: 12.9 g/dL — ABNORMAL LOW (ref 13.0–17.7)
Immature Grans (Abs): 0 10*3/uL (ref 0.0–0.1)
Immature Granulocytes: 0 %
Lymphocytes Absolute: 1.3 10*3/uL (ref 0.7–3.1)
Lymphs: 33 %
MCH: 27.7 pg (ref 26.6–33.0)
MCHC: 30.8 g/dL — ABNORMAL LOW (ref 31.5–35.7)
MCV: 90 fL (ref 79–97)
Monocytes Absolute: 0.3 10*3/uL (ref 0.1–0.9)
Monocytes: 8 %
Neutrophils Absolute: 2.3 10*3/uL (ref 1.4–7.0)
Neutrophils: 58 %
Platelets: 129 10*3/uL — ABNORMAL LOW (ref 150–450)
RBC: 4.65 x10E6/uL (ref 4.14–5.80)
RDW: 11.9 % (ref 11.6–15.4)
WBC: 3.9 10*3/uL (ref 3.4–10.8)

## 2023-11-12 LAB — IRON,TIBC AND FERRITIN PANEL
Ferritin: 145 ng/mL (ref 30–400)
Iron Saturation: 39 % (ref 15–55)
Iron: 73 ug/dL (ref 38–169)
Total Iron Binding Capacity: 189 ug/dL — ABNORMAL LOW (ref 250–450)
UIBC: 116 ug/dL (ref 111–343)

## 2023-11-12 LAB — VITAMIN B12: Vitamin B-12: 139 pg/mL — ABNORMAL LOW (ref 232–1245)

## 2023-11-12 LAB — PSA: Prostate Specific Ag, Serum: 0.3 ng/mL (ref 0.0–4.0)

## 2023-11-20 ENCOUNTER — Ambulatory Visit (INDEPENDENT_AMBULATORY_CARE_PROVIDER_SITE_OTHER): Payer: Medicare HMO

## 2023-11-20 VITALS — BP 112/72 | HR 75 | Temp 98.5°F | Ht 70.0 in | Wt 165.0 lb

## 2023-11-20 DIAGNOSIS — E538 Deficiency of other specified B group vitamins: Secondary | ICD-10-CM | POA: Diagnosis not present

## 2023-11-20 MED ORDER — CYANOCOBALAMIN 1000 MCG/ML IJ SOLN
1000.0000 ug | Freq: Once | INTRAMUSCULAR | Status: AC
Start: 2023-11-20 — End: 2023-11-20
  Administered 2023-11-20: 1000 ug via INTRAMUSCULAR

## 2023-11-20 NOTE — Progress Notes (Signed)
Presented today for a b12. Patients second b12 was schedule.

## 2023-11-26 ENCOUNTER — Ambulatory Visit: Payer: Medicare HMO

## 2023-11-26 VITALS — BP 120/70 | HR 75 | Temp 98.5°F | Ht 70.0 in | Wt 165.0 lb

## 2023-11-26 DIAGNOSIS — E538 Deficiency of other specified B group vitamins: Secondary | ICD-10-CM | POA: Diagnosis not present

## 2023-11-26 MED ORDER — CYANOCOBALAMIN 1000 MCG/ML IJ SOLN
1000.0000 ug | Freq: Once | INTRAMUSCULAR | Status: AC
Start: 2023-11-26 — End: 2023-11-26
  Administered 2023-11-26: 1000 ug via INTRAMUSCULAR

## 2023-11-26 NOTE — Progress Notes (Signed)
Patient presents today for b12 shot. Patient was scheduled for his next b12.

## 2023-11-27 ENCOUNTER — Ambulatory Visit: Payer: Self-pay

## 2023-11-27 ENCOUNTER — Other Ambulatory Visit: Payer: BC Managed Care – PPO

## 2023-11-30 ENCOUNTER — Ambulatory Visit: Payer: Self-pay

## 2023-11-30 NOTE — Patient Instructions (Signed)
Visit Information  Thank you for taking time to visit with me today. Please don't hesitate to contact me if I can be of assistance to you.   Following are the goals we discussed today:   Goals Addressed             This Visit's Progress    To get help with preparing meals in order to eat better       Care Coordination Interventions: Evaluation of current treatment plan related to Nutritional Imbalance and patient's adherence to plan as established by provider Determined patient would like to improve his Nutritional intake, he is currently having difficulty self-preparing meals, he is averaging 2 meals per day, breakfast at [employer] and dinner at [employer] Discussed with patient he continues to drive himself therefore he is not eligible for MOW Determined patient has Vitamin B12 deficiency and is receiving B12 injections at his PCP office, this has improved his energy level Discussed with patient he has never applied for Medicaid but would like to know if he is eligible in case he can get additional assistance Sent SW referral to Remigio Eisenmenger BSW to assist patient with Medicaid eligibility and or to offer additional resources for custodial care Discussed plans with patient for ongoing care coordination follow up and provided patient with direct contact information for nurse care coordinator Body mass index is 23.7 kg/m.     Wt Readings from Last 3 Encounters:  11/11/23 165 lb 3.2 oz (74.9 kg)  05/27/23 150 lb 3.2 oz (68.1 kg)  02/19/23 151 lb (68.5 kg)             Our next appointment is by telephone on 12/28/23 at 10:30 AM  Please call the care guide team at [phone] if you need to cancel or reschedule your appointment.   If you are experiencing a Mental Health or Behavioral Health Crisis or need someone to talk to, please call 1-800-273-TALK (toll free, 24 hour hotline)  The patient verbalized understanding of instructions, educational materials, and care plan  provided today and DECLINED offer to receive copy of patient instructions, educational materials, and care plan.   Delsa Sale RN BSN CCM [place]  [facility], [facility] Nurse Care Coordinator  Direct Dial: [phone] Website: [email or web address]

## 2023-11-30 NOTE — Patient Outreach (Signed)
  Care Coordination   Follow Up Visit Note   11/30/2023 Name: Westley Simonelli MRN: [id number] DOB: 10-Mar-1934  Mynor Hilfiker is a 89 y.o. year old male who sees [name], FNP for primary care. I spoke with  [name] by phone today.  What matters to the patients health and wellness today?  Patient would like help with meal preparation in order to eat better.    Goals Addressed             This Visit's Progress    To get help with preparing meals in order to eat better       Care Coordination Interventions: Evaluation of current treatment plan related to Nutritional Imbalance and patient's adherence to plan as established by provider Determined patient would like to improve his Nutritional intake, he is currently having difficulty self-preparing meals, he is averaging 2 meals per day, breakfast at [employer] and dinner at [employer] Discussed with patient he continues to drive himself therefore he is not eligible for MOW Determined patient has Vitamin B12 deficiency and is receiving B12 injections at his PCP office, this has improved his energy level Discussed with patient he has never applied for Medicaid but would like to know if he is eligible in case he can get additional assistance Sent SW referral to Remigio Eisenmenger BSW to assist patient with Medicaid eligibility and or to offer additional resources for custodial care Discussed plans with patient for ongoing care coordination follow up and provided patient with direct contact information for nurse care coordinator Body mass index is 23.7 kg/m.     Wt Readings from Last 3 Encounters:  11/11/23 165 lb 3.2 oz (74.9 kg)  05/27/23 150 lb 3.2 oz (68.1 kg)  02/19/23 151 lb (68.5 kg)     Interventions Today    Flowsheet Row Most Recent Value  Chronic Disease   Chronic disease during today's visit Other  [Nutritional Imbalance]  General Interventions   General Interventions Discussed/Reviewed General Interventions Discussed,  General Interventions Reviewed, Doctor Visits, Communication with  Doctor Visits Discussed/Reviewed PCP, Doctor Visits Reviewed, Doctor Visits Discussed  Communication with Social Work, PCP/Specialists  [Carla Doran Durand,  [name] FNP]  Exercise Interventions   Exercise Discussed/Reviewed Physical Activity  Physical Activity Discussed/Reviewed Physical Activity Reviewed  Education Interventions   Education Provided Provided Education  Provided Verbal Education On Nutrition, When to see the doctor  Nutrition Interventions   Nutrition Discussed/Reviewed Nutrition Discussed, Nutrition Reviewed, Increasing proteins  Pharmacy Interventions   Pharmacy Dicussed/Reviewed Pharmacy Topics Discussed, Pharmacy Topics Reviewed, Medications and their functions          SDOH assessments and interventions completed:  Yes  SDOH Interventions Today    Flowsheet Row Most Recent Value  SDOH Interventions   Food Insecurity Interventions Intervention Not Indicated  Housing Interventions Intervention Not Indicated  Transportation Interventions Intervention Not Indicated  Utilities Interventions Intervention Not Indicated        Care Coordination Interventions:  Yes, provided   Follow up plan: Referral made to Remigio Eisenmenger BSW to assist with Medicaid eligibility and or resources for custodial care to assist with meal prep, call scheduled for 12/08/23 @1 :45 PM Follow up call scheduled for 12/28/23 @10 :30 AM    Encounter Outcome:  Patient Visit Completed

## 2023-12-02 ENCOUNTER — Ambulatory Visit (INDEPENDENT_AMBULATORY_CARE_PROVIDER_SITE_OTHER): Payer: Medicare HMO | Admitting: Podiatry

## 2023-12-02 ENCOUNTER — Encounter: Payer: Self-pay | Admitting: Podiatry

## 2023-12-02 DIAGNOSIS — M79674 Pain in right toe(s): Secondary | ICD-10-CM

## 2023-12-02 DIAGNOSIS — L84 Corns and callosities: Secondary | ICD-10-CM | POA: Diagnosis not present

## 2023-12-02 DIAGNOSIS — M79675 Pain in left toe(s): Secondary | ICD-10-CM

## 2023-12-02 DIAGNOSIS — B351 Tinea unguium: Secondary | ICD-10-CM | POA: Diagnosis not present

## 2023-12-02 NOTE — Progress Notes (Signed)
  Subjective:  Patient ID: Nathan Morse, male    DOB: March 29, 1934,  MRN: [id number]  Nathan Morse presents to clinic today for: at risk foot care with h/o CKD and corn(s) of both feet, callus(es) of both feet and painful mycotic nails.  Pain interferes with ambulation. Aggravating factors include wearing enclosed shoe gear. Painful toenails interfere with ambulation. Aggravating factors include wearing enclosed shoe gear. Pain is relieved with periodic professional debridement. Painful corns and calluses are aggravated when weightbearing with and without shoegear. Pain is relieved with periodic professional debridement.   PCP is [name], FNP.  No Known Allergies  Review of Systems: Negative except as noted in the HPI.  Objective: No changes noted in today's physical examination. There were no vitals filed for this visit.  Nathan Morse is a pleasant 89 y.o. male in NAD. AAO x 3.  Vascular Examination: Capillary refill time <3 seconds b/l LE. Palpable pedal pulses b/l LE. Digital hair sparse b/l. No pedal edema b/l. Skin temperature gradient WNL b/l. No varicosities b/l. Marland Kitchen  Dermatological Examination: Pedal skin with normal turgor, texture and tone b/l. No open wounds. No interdigital macerations b/l. Toenails 1-5 b/l thickened, discolored, dystrophic with subungual debris. There is pain on palpation to dorsal aspect of nailplates. Hyperkeratotic lesion(s) L 5th toe, R 5th toe, and submet head 5 left foot.  No erythema, no edema, no drainage, no fluctuance..  Neurological Examination: Protective sensation intact with 10 gram monofilament b/l LE. Vibratory sensation intact b/l LE.   Musculoskeletal Examination: Muscle strength 5/5 to all lower extremity muscle groups bilaterally. Hammertoe(s) noted to the L 5th toe and R 5th toe.  Assessment/Plan: 1. Pain due to onychomycosis of toenails of both feet   2. Corns and callosities     -Patient was evaluated and treated. All patient's  and/or POA's questions/concerns answered on today's visit. -Patient to continue soft, supportive shoe gear daily. -Mycotic toenails 1-5 bilaterally were debrided in length and girth with sterile nail nippers and dremel without incident. -Corn(s) L 5th toe and R 5th toe pared utilizing sterile scalpel blade without complication or incident. Total number debrided=2. -Callus(es) submet head 5 left foot pared utilizing sterile scalpel blade without complication or incident. Total number debrided =1. -Patient/POA to call should there be question/concern in the interim.   No follow-ups on file.  Louann Sjogren, DPM

## 2023-12-03 ENCOUNTER — Ambulatory Visit: Payer: Medicare HMO

## 2023-12-03 DIAGNOSIS — Z0000 Encounter for general adult medical examination without abnormal findings: Secondary | ICD-10-CM | POA: Diagnosis not present

## 2023-12-03 NOTE — Patient Instructions (Signed)
Mr. Wolaver , Thank you for taking time to come for your Medicare Wellness Visit. I appreciate your ongoing commitment to your health goals. Please review the following plan we discussed and let me know if I can assist you in the future.   Referrals/Orders/Follow-Ups/Clinician Recommendations: none  This is a list of the screening recommended for you and due dates:  Health Maintenance  Topic Date Due   COVID-19 Vaccine (6 - 2024-25 season) 10/29/2023   Medicare Annual Wellness Visit  12/02/2024   DTaP/Tdap/Td vaccine (3 - Td or Tdap) 06/10/2032   Pneumonia Vaccine  Completed   Flu Shot  Completed   Zoster (Shingles) Vaccine  Completed   HPV Vaccine  Aged Out    Advanced directives: (Declined) Advance directive discussed with you today. Even though you declined this today, please call our office should you change your mind, and we can give you the proper paperwork for you to fill out.  Next Medicare Annual Wellness Visit scheduled for next year: No, office will schedule  insert Preventive Care attachment Insert FALL PREVENTION attachment if needed

## 2023-12-03 NOTE — Progress Notes (Signed)
Subjective:   Nathan Morse is a 89 y.o. male who presents for Medicare Annual/Subsequent preventive examination.  Visit Complete: Virtual I connected with  Nathan Morse on 12/03/23 by a audio enabled telemedicine application and verified that I am speaking with the correct person using two identifiers.  Patient Location: Home  Provider Location: Office/Clinic  I discussed the limitations of evaluation and management by telemedicine. The patient expressed understanding and agreed to proceed.  Vital Signs: Because this visit was a virtual/telehealth visit, some criteria may be missing or patient reported. Any vitals not documented were not able to be obtained and vitals that have been documented are patient reported.    Cardiac Risk Factors include: advanced age (>42men, >33 women);male gender     Objective:    Today's Vitals   There is no height or weight on file to calculate BMI.     12/03/2023    4:31 PM 11/05/2022   11:22 AM 05/14/2022    6:24 PM 04/06/2022    8:09 PM 10/24/2021   11:17 AM 11/28/2020   10:09 AM 06/21/2020    8:09 AM  Advanced Directives  Does Patient Have a Medical Advance Directive? No Yes No No Yes Yes No  Type of Furniture conservator/restorer   Living will Healthcare Power of [place];Living will   Copy of Healthcare Power of Attorney in Chart?  No - copy requested    No - copy requested   Would patient like information on creating a medical advance directive?   No - Patient declined    No - Patient declined    Current Medications (verified) Outpatient Encounter Medications as of 12/03/2023  Medication Sig   atorvastatin (LIPITOR) 10 MG tablet TAKE 1 TABLET(10 MG) BY MOUTH DAILY   docusate sodium (COLACE) 100 MG capsule Take 100 mg by mouth 2 (two) times daily.   linaclotide (LINZESS) 145 MCG CAPS capsule Take 1 capsule (145 mcg total) by mouth daily before breakfast.   mirtazapine (REMERON) 7.5 MG tablet TAKE 1 TABLET(7.5 MG) BY  MOUTH AT BEDTIME   oxybutynin (DITROPAN XL) 10 MG 24 hr tablet Take 1 tablet (10 mg total) by mouth at bedtime.   diclofenac Sodium (VOLTAREN) 1 % GEL Apply 2 g topically 4 (four) times daily. (Patient not taking: Reported on 12/03/2023)   No facility-administered encounter medications on file as of 12/03/2023.    Allergies (verified) Patient has no known allergies.   History: Past Medical History:  Diagnosis Date   Cancer (HCC)    Muscle spasm 05/19/2019   Seborrheic keratosis, inflamed 10/16/2020   Skin Surgery    Urinary tract infection with hematuria 03/22/2019   Past Surgical History:  Procedure Laterality Date   APPENDECTOMY     INGUINAL HERNIA REPAIR Right 06/26/2020   Procedure: right open inguinal hernia repair with mesh;  Surgeon: Kinsinger, De Blanch, MD;  Location: [facility];  Service: General;  Laterality: Right;   PENILE BIOPSY     SHOULDER ARTHROSCOPY     TONSILLECTOMY     TONSILLECTOMY AND ADENOIDECTOMY     Family History  Adopted: Yes  Family history unknown: Yes   Social History   Socioeconomic History   Marital status: Widowed    Spouse name: Not on file   Number of children: Not on file   Years of education: Not on file   Highest education level: Not on file  Occupational History   Occupation: retired  Tobacco Use   Smoking status:  Never   Smokeless tobacco: Current    Types: Chew   Tobacco comments:    wants to quit, but hard. 01/03/22 - has only chewed tobacco twice since his last visit.   Vaping Use   Vaping status: Never Used  Substance and Sexual Activity   Alcohol use: No    Alcohol/week: 0.0 standard drinks of alcohol   Drug use: No   Sexual activity: Not Currently  Other Topics Concern   Not on file  Social History Narrative   Not on file   Social Drivers of Health   Financial Resource Strain: Low Risk  (12/03/2023)   Overall Financial Resource Strain (CARDIA)    Difficulty of Paying Living Expenses: Not hard at all  Food  Insecurity: No Food Insecurity (12/03/2023)   Hunger Vital Sign    Worried About Running Out of Food in the Last Year: Never true    Ran Out of Food in the Last Year: Never true  Transportation Needs: No Transportation Needs (12/03/2023)   PRAPARE - Administrator, Civil Service (Medical): No    Lack of Transportation (Non-Medical): No  Physical Activity: Inactive (12/03/2023)   Exercise Vital Sign    Days of Exercise per Week: 0 days    Minutes of Exercise per Session: 0 min  Stress: No Stress Concern Present (12/03/2023)   [employer] of Occupational Health - Occupational Stress Questionnaire    Feeling of Stress : Not at all  Social Connections: Moderately Isolated (12/03/2023)   Social Connection and Isolation Panel [NHANES]    Frequency of Communication with Friends and Family: Twice a week    Frequency of Social Gatherings with Friends and Family: Once a week    Attends Religious Services: More than 4 times per year    Active Member of [employer] or Organizations: No    Attends Banker Meetings: Never    Marital Status: Widowed    Tobacco Counseling Ready to quit: Not Answered Counseling given: Not Answered Tobacco comments: wants to quit, but hard. 01/03/22 - has only chewed tobacco twice since his last visit.    Clinical Intake:  Pre-visit preparation completed: Yes  Pain : No/denies pain     Nutritional Risks: None Diabetes: No  How often do you need to have someone help you when you read instructions, pamphlets, or other written materials from your doctor or pharmacy?: 1 - Never  Interpreter Needed?: No  Information entered by :: NAllen LPN   Activities of Daily Living    12/03/2023    4:26 PM  In your present state of health, do you have any difficulty performing the following activities:  Hearing? 0  Vision? 0  Difficulty concentrating or making decisions? 0  Comment has someone that helps with decisions  Walking or  climbing stairs? 1  Dressing or bathing? 0  Doing errands, shopping? 0  Preparing Food and eating ? N  Using the Toilet? N  In the past six months, have you accidently leaked urine? N  Do you have problems with loss of bowel control? N  Managing your Medications? N  Managing your Finances? N  Housekeeping or managing your Housekeeping? N    Patient Care Team: [name], FNP as PCP - General (General Practice)  Indicate any recent Medical Services you may have received from other than [facility] providers in the past year (date may be approximate).     Assessment:   This is a routine wellness examination for Nathan Morse.  Hearing/Vision screen Hearing Screening - Comments:: Denies hearing issues Vision Screening - Comments:: Regular eye exams,    Goals Addressed             This Visit's Progress    Patient Stated       12/03/2023, denies goals       Depression Screen    12/03/2023    4:33 PM 02/19/2023    9:56 AM 11/05/2022   11:23 AM 10/24/2021   11:18 AM 11/28/2020   10:10 AM 11/24/2019   10:46 AM 09/29/2019    2:11 PM  PHQ 2/9 Scores  PHQ - 2 Score 0 0 0 0 0 0 0    Fall Risk    12/03/2023    4:32 PM 03/17/2023    3:12 PM 02/19/2023    9:55 AM 11/05/2022   11:22 AM 10/24/2021   11:18 AM  Fall Risk   Falls in the past year? 0 0 0 1 0  Comment    tripped   Number falls in past yr: 0 0  0   Injury with Fall? 0 0  0   Risk for fall due to : Medication side effect No Fall Risks  Impaired mobility;Medication side effect Medication side effect  Follow up Falls prevention discussed;Falls evaluation completed Falls evaluation completed;Education provided  Falls evaluation completed;Education provided;Falls prevention discussed Falls evaluation completed;Education provided;Falls prevention discussed    MEDICARE RISK AT HOME: Medicare Risk at Home Any stairs in or around the home?: No If so, are there any without handrails?: No Home free of loose throw rugs in  walkways, pet beds, electrical cords, etc?: Yes Adequate lighting in your home to reduce risk of falls?: Yes Life alert?: No Use of a cane, walker or w/c?: No Grab bars in the bathroom?: Yes Shower chair or bench in shower?: Yes Elevated toilet seat or a handicapped toilet?: No  TIMED UP AND GO:  Was the test performed?  No    Cognitive Function:  6 CIT not administered. Patient states that he can not read or write.        11/28/2020   10:14 AM 07/12/2019    9:35 AM 11/17/2018   11:12 AM  6CIT Screen  What Year? 4 points 0 points 0 points  What month? 0 points 0 points 0 points  What time? 0 points 0 points 0 points  Count back from 20 2 points 2 points 2 points  Months in reverse 4 points 4 points 4 points  Repeat phrase 10 points 0 points 6 points  Total Score 20 points 6 points 12 points    Immunizations Immunization History  Administered Date(s) Administered   Fluad Quad(high Dose 65+) 08/23/2019, 09/03/2020, 10/30/2021, 08/21/2022   Hepatitis B, PED/ADOLESCENT 05/12/2023   Hepb-cpg 05/12/2023   Influenza Nasal 08/23/2019   Influenza, High Dose Seasonal PF 11/17/2018   Influenza-Unspecified 09/03/2023   Moderna Sars-Covid-2 Vaccination 01/28/2020, 02/25/2020   PFIZER(Purple Top)SARS-COV-2 Vaccination 04/30/2021   PNEUMOCOCCAL CONJUGATE-20 06/05/2021, 06/05/2021   Pfizer(Comirnaty)Fall Seasonal Vaccine 12 years and older 02/04/2023, 09/03/2023   Tdap 05/21/2012, 06/10/2022   Zoster Recombinant(Shingrix) 06/15/2017, 08/17/2017    TDAP status: Up to date  Flu Vaccine status: Up to date  Pneumococcal vaccine status: Up to date  Covid-19 vaccine status: Completed vaccines  Qualifies for Shingles Vaccine? Yes   Zostavax completed Yes   Shingrix Completed?: Yes  Screening Tests Health Maintenance  Topic Date Due   COVID-19 Vaccine (6 - 2024-25 season) 10/29/2023   Medicare  Annual Wellness (AWV)  12/02/2024   DTaP/Tdap/Td (3 - Td or Tdap) 06/10/2032    Pneumonia Vaccine 54+ Years old  Completed   INFLUENZA VACCINE  Completed   Zoster Vaccines- Shingrix  Completed   HPV VACCINES  Aged Out    Health Maintenance  Health Maintenance Due  Topic Date Due   COVID-19 Vaccine (6 - 2024-25 season) 10/29/2023    Colorectal cancer screening: No longer required.   Lung Cancer Screening: (Low Dose CT Chest recommended if Age 38-80 years, 20 pack-year currently smoking OR have quit w/in 15years.) does not qualify.   Lung Cancer Screening Referral: no  Additional Screening:  Hepatitis C Screening: does not qualify;   Vision Screening: Recommended annual ophthalmology exams for early detection of glaucoma and other disorders of the eye. Is the patient up to date with their annual eye exam?  Yes  Who is the provider or what is the name of the office in which the patient attends annual eye exams? Can't remember name If pt is not established with a provider, would they like to be referred to a provider to establish care? No .   Dental Screening: Recommended annual dental exams for proper oral hygiene  Diabetic Foot Exam: n/a  Community Resource Referral / Chronic Care Management: CRR required this visit?  No   CCM required this visit?  No     Plan:     I have personally reviewed and noted the following in the patient's chart:   Medical and social history Use of alcohol, tobacco or illicit drugs  Current medications and supplements including opioid prescriptions. Patient is not currently taking opioid prescriptions. Functional ability and status Nutritional status Physical activity Advanced directives List of other physicians Hospitalizations, surgeries, and ER visits in previous 12 months Vitals Screenings to include cognitive, depression, and falls Referrals and appointments  In addition, I have reviewed and discussed with patient certain preventive protocols, quality metrics, and best practice recommendations. A written  personalized care plan for preventive services as well as general preventive health recommendations were provided to patient.     Barb Merino, LPN   09/81/1914   After Visit Summary: (Pick Up) Due to this being a telephonic visit, with patients personalized plan was offered to patient and patient has requested to Pick up at office.  Nurse Notes: none

## 2023-12-04 ENCOUNTER — Ambulatory Visit: Payer: Medicare HMO

## 2023-12-04 VITALS — BP 110/70 | HR 74 | Temp 98.5°F | Ht 70.0 in | Wt 165.0 lb

## 2023-12-04 DIAGNOSIS — E538 Deficiency of other specified B group vitamins: Secondary | ICD-10-CM | POA: Diagnosis not present

## 2023-12-04 MED ORDER — CYANOCOBALAMIN 1000 MCG/ML IJ SOLN
1000.0000 ug | Freq: Once | INTRAMUSCULAR | Status: AC
Start: 2023-12-04 — End: 2023-12-04
  Administered 2023-12-04: 1000 ug via INTRAMUSCULAR

## 2023-12-04 NOTE — Progress Notes (Cosign Needed)
Patient presents today 3rd b12 shot- follow up with Nathan Morse was scheduled.

## 2023-12-08 ENCOUNTER — Ambulatory Visit: Payer: Self-pay | Admitting: Licensed Clinical Social Worker

## 2023-12-08 NOTE — Patient Outreach (Signed)
  Care Coordination   12/08/2023 Name: Jacsen Powles MRN: [id number] DOB: 07-Jul-1934   Care Coordination Outreach Attempts:  An unsuccessful outreach was attempted for an appointment today.  Follow Up Plan:  Additional outreach attempts will be made to offer the patient complex care management information and services.   Encounter Outcome:  No Answer   Care Coordination Interventions:  No, not indicated    Jeanie Cooks, PhD [facility], [facility] Social Worker Direct Dial: [phone]  Fax: [phone]

## 2023-12-21 ENCOUNTER — Ambulatory Visit: Payer: No Typology Code available for payment source | Admitting: Nurse Practitioner

## 2023-12-21 ENCOUNTER — Encounter: Payer: Self-pay | Admitting: Nurse Practitioner

## 2023-12-21 VITALS — BP 130/70 | HR 62 | Temp 97.7°F | Ht 70.0 in | Wt 162.4 lb

## 2023-12-21 DIAGNOSIS — I700 Atherosclerosis of aorta: Secondary | ICD-10-CM | POA: Diagnosis not present

## 2023-12-21 DIAGNOSIS — R3981 Functional urinary incontinence: Secondary | ICD-10-CM

## 2023-12-21 DIAGNOSIS — Z2821 Immunization not carried out because of patient refusal: Secondary | ICD-10-CM

## 2023-12-21 DIAGNOSIS — E538 Deficiency of other specified B group vitamins: Secondary | ICD-10-CM | POA: Diagnosis not present

## 2023-12-21 DIAGNOSIS — N1831 Chronic kidney disease, stage 3a: Secondary | ICD-10-CM

## 2023-12-21 MED ORDER — OXYBUTYNIN CHLORIDE ER 10 MG PO TB24: 10.0000 mg | ORAL_TABLET | Freq: Every day | ORAL | 1 refills | Status: DC

## 2023-12-21 MED ORDER — CYANOCOBALAMIN 1000 MCG/ML IJ SOLN
1000.0000 ug | Freq: Once | INTRAMUSCULAR | Status: AC
Start: 2023-12-21 — End: 2023-12-21
  Administered 2023-12-21: 1000 ug via INTRAMUSCULAR

## 2023-12-21 NOTE — Progress Notes (Signed)
 LILLETTE Kristeen JINNY Gladis, CMA,acting as a neurosurgeon for Gaines Ada, FNP.,have documented all relevant documentation on the behalf of Gaines Ada, FNP,as directed by  Gaines Ada, FNP while in the presence of Gaines Ada, FNP.  Subjective:  Patient ID: Nathan Morse , male    DOB: 1934-08-06 , 89 y.o.   MRN: [id number]  Chief Complaint  Patient presents with   B12 Injection    HPI  Patient presents today for a b12 follow up, Patient reports compliance with medication. Patient denies any chest pain, SOB, or headaches. Patient has no concerns today. Patient reports he thinks he got a little more energy.  He will sometimes drive to [facility] to his home alone.      Past Medical History:  Diagnosis Date   Cancer [facility])    Muscle spasm 05/19/2019   Seborrheic keratosis, inflamed 10/16/2020   Skin Surgery    Urinary tract infection with hematuria 03/22/2019     Family History  Adopted: Yes  Family history unknown: Yes     Current Outpatient Medications:    atorvastatin  (LIPITOR) 10 MG tablet, TAKE 1 TABLET(10 MG) BY MOUTH DAILY, Disp: 90 tablet, Rfl: 1   docusate sodium (COLACE) 100 MG capsule, Take 100 mg by mouth 2 (two) times daily., Disp: , Rfl:    linaclotide  (LINZESS ) 145 MCG CAPS capsule, Take 1 capsule (145 mcg total) by mouth daily before breakfast., Disp: 90 capsule, Rfl: 1   mirtazapine  (REMERON ) 7.5 MG tablet, TAKE 1 TABLET(7.5 MG) BY MOUTH AT BEDTIME, Disp: 90 tablet, Rfl: 1   cyanocobalamin  (VITAMIN B12) 1000 MCG tablet, Take 1 tablet (1,000 mcg total) by mouth daily., Disp: 90 tablet, Rfl: 1   diclofenac  Sodium (VOLTAREN ) 1 % GEL, Apply 2 g topically 4 (four) times daily. (Patient not taking: Reported on 12/21/2023), Disp: 100 g, Rfl: 2   oxybutynin  (DITROPAN  XL) 10 MG 24 hr tablet, Take 1 tablet (10 mg total) by mouth at bedtime., Disp: 90 tablet, Rfl: 1   No Known Allergies   Review of Systems  Constitutional: Negative.   Respiratory: Negative.    Cardiovascular: Negative.    Neurological: Negative.   Psychiatric/Behavioral: Negative.       Today's Vitals   12/21/23 1606 12/21/23 1627  BP: (!) 140/60 130/70  Pulse: 62   Temp: 97.7 F (36.5 C)   TempSrc: Oral   Weight: 162 lb 6.4 oz (73.7 kg)   Height: 5' 10 (1.778 m)   PainSc: 0-No pain    Body mass index is 23.3 kg/m.  Wt Readings from Last 3 Encounters:  12/21/23 162 lb 6.4 oz (73.7 kg)  12/04/23 165 lb (74.8 kg)  11/26/23 165 lb (74.8 kg)    The ASCVD Risk score (Arnett DK, et al., 2019) failed to calculate for the following reasons:   The 2019 ASCVD risk score is only valid for ages 21 to 3  Objective:  Physical Exam Vitals reviewed.  Constitutional:      General: He is not in acute distress.    Appearance: Normal appearance.  HENT:     Mouth/Throat:     Dentition: Normal dentition. Does not have dentures.  Cardiovascular:     Rate and Rhythm: Normal rate and regular rhythm.     Pulses: Normal pulses.     Heart sounds: Normal heart sounds. No murmur heard. Pulmonary:     Effort: Pulmonary effort is normal. No respiratory distress.     Breath sounds: Normal breath sounds. No wheezing.  Abdominal:  General: Abdomen is flat. Bowel sounds are normal. There is no distension.     Palpations: Abdomen is soft.     Tenderness: There is no abdominal tenderness.  Skin:    General: Skin is warm and dry.     Capillary Refill: Capillary refill takes less than 2 seconds.  Neurological:     General: No focal deficit present.     Mental Status: He is alert and oriented to person, place, and time.     Cranial Nerves: No cranial nerve deficit.     Motor: No weakness.  Psychiatric:        Mood and Affect: Mood normal.        Behavior: Behavior normal.        Thought Content: Thought content normal.        Judgment: Judgment normal.         Assessment And Plan:  B12 deficiency Assessment & Plan: He is feeling better after his vitamin B12 injections, will give one today and check his  levels.  Orders: -     Vitamin B12 -     Cyanocobalamin   Stage 3a chronic kidney disease (HCC) Assessment & Plan: Stable continue to stay well hydrated with water.    Functional urinary incontinence Assessment & Plan: He would like to continue taking the oxybutynin , this has been helpful  Orders: -     oxyBUTYnin  Chloride ER; Take 1 tablet (10 mg total) by mouth at bedtime.  Dispense: 90 tablet; Refill: 1  Aortic atherosclerosis (HCC) Assessment & Plan: Continue statin, tolerating well.   COVID-19 vaccination declined Assessment & Plan: Declines covid 19 vaccine. Discussed risk of covid 47 and if he changes her mind about the vaccine to call the office. Education has been provided regarding the importance of this vaccine but patient still declined. Advised may receive this vaccine at local pharmacy or Health Dept.or vaccine clinic. Aware to provide a copy of the vaccination record if obtained from local pharmacy or Health Dept.  Encouraged to take multivitamin, vitamin d , vitamin c and zinc to increase immune system. Aware can call office if would like to have vaccine here at office. Verbalized acceptance and understanding.      Return for keep same next.  Patient was given opportunity to ask questions. Patient verbalized understanding of the plan and was able to repeat key elements of the plan. All questions were answered to their satisfaction.   LILLETTE Gaines Ada, FNP, have reviewed all documentation for this visit. The documentation on 12/21/23 for the exam, diagnosis, procedures, and orders are all accurate and complete.    IF YOU HAVE BEEN REFERRED TO A SPECIALIST, IT MAY TAKE 1-2 WEEKS TO SCHEDULE/PROCESS THE REFERRAL. IF YOU HAVE NOT HEARD FROM US /SPECIALIST IN TWO WEEKS, PLEASE GIVE US  A CALL AT [phone].

## 2023-12-22 ENCOUNTER — Other Ambulatory Visit: Payer: Self-pay | Admitting: Nurse Practitioner

## 2023-12-22 ENCOUNTER — Telehealth: Payer: Self-pay | Admitting: *Deleted

## 2023-12-22 DIAGNOSIS — E538 Deficiency of other specified B group vitamins: Secondary | ICD-10-CM

## 2023-12-22 LAB — VITAMIN B12: Vitamin B-12: 644 pg/mL (ref 232–1245)

## 2023-12-22 MED ORDER — VITAMIN B-12 1000 MCG PO TABS
1000.0000 ug | ORAL_TABLET | Freq: Every day | ORAL | 1 refills | Status: DC
Start: 2023-12-22 — End: 2024-08-08

## 2023-12-22 NOTE — Progress Notes (Signed)
 Complex Care Management Care Guide Note  12/22/2023 Name: Nathan Morse MRN: [id number] DOB: 1934-02-29  Garyn Arlotta is a 89 y.o. year old male who is a primary care patient of Georgina Speaks, FNP and is actively engaged with the care management team. I reached out to Legrand Gata by phone today to assist with re-scheduling  with the BSW.  Follow up plan: Unsuccessful telephone outreach attempt made. A HIPAA compliant phone message was left for the patient providing contact information and requesting a return call.  Thedford Franks, CCMA Care Coordination Care Guide Direct Dial: [phone]

## 2023-12-27 DIAGNOSIS — I700 Atherosclerosis of aorta: Secondary | ICD-10-CM | POA: Insufficient documentation

## 2023-12-27 DIAGNOSIS — E538 Deficiency of other specified B group vitamins: Secondary | ICD-10-CM | POA: Insufficient documentation

## 2023-12-27 DIAGNOSIS — R3981 Functional urinary incontinence: Secondary | ICD-10-CM | POA: Insufficient documentation

## 2023-12-27 NOTE — Assessment & Plan Note (Signed)
 Stable continue to stay well hydrated with water.

## 2023-12-27 NOTE — Assessment & Plan Note (Signed)

## 2023-12-27 NOTE — Assessment & Plan Note (Signed)
 Continue statin, tolerating well

## 2023-12-27 NOTE — Assessment & Plan Note (Signed)
 He is feeling better after his vitamin B12 injections, will give one today and check his levels.

## 2023-12-27 NOTE — Assessment & Plan Note (Signed)
 He would like to continue taking the oxybutynin, this has been helpful

## 2023-12-28 ENCOUNTER — Ambulatory Visit: Payer: Self-pay

## 2023-12-28 NOTE — Patient Instructions (Signed)
 Visit Information  Thank you for taking time to visit with me today. Please don't hesitate to contact me if I can be of assistance to you.   Following are the goals we discussed today:   Goals Addressed             This Visit's Progress    To get help with preparing meals in order to eat better   On track    Care Coordination Interventions: Evaluation of current treatment plan related to Nutritional Imbalance and patient's adherence to plan as established by provider Determined patient missed the call from SW [facility], rescheduled call for 01/08/24 @1 :30 PM Determined patient continues to eat 2 meals per day, his meals come [employer] and [employer] Instructed patient to continue to drink Ensure 1-2 times daily for protein supplementation and patient verbalizes understanding, sent request to Leita Lyme, VBCI admin assistant to mail Ensure Coupons Discussed plans with patient for ongoing care coordination follow up and provided patient with direct contact information for nurse care coordinator Body mass index is 23.7 kg/m.     Wt Readings from Last 3 Encounters:  11/11/23 165 lb 3.2 oz (74.9 kg)  05/27/23 150 lb 3.2 oz (68.1 kg)  02/19/23 151 lb (68.5 kg)             Our next appointment is by telephone on 12/25/23 at 1:30 PM  Please call the care guide team at [phone] if you need to cancel or reschedule your appointment.   If you are experiencing a Mental Health or Behavioral Health Crisis or need someone to talk to, please call 1-800-273-TALK (toll free, 24 hour hotline)  The patient verbalized understanding of instructions, educational materials, and care plan provided today and DECLINED offer to receive copy of patient instructions, educational materials, and care plan.   Clayborne Ly RN BSN CCM [place]  [facility], [facility] Nurse Care Coordinator  Direct Dial: [phone] Website: [email or web address]

## 2023-12-28 NOTE — Patient Outreach (Signed)
  Care Coordination   Follow Up Visit Note   12/28/2023 Name: Nathan Morse MRN: [id number] DOB: 1934/10/06  Nathan Morse is a 89 y.o. year old male who sees Georgina Speaks, FNP for primary care. I spoke with  Legrand Gata by phone today.  What matters to the patients health and wellness today?  Patient would like to get assistance with meal preparation.     Goals Addressed             This Visit's Progress    To get help with preparing meals in order to eat better   On track    Care Coordination Interventions: Evaluation of current treatment plan related to Nutritional Imbalance and patient's adherence to plan as established by provider Determined patient missed the call from SW [facility], rescheduled call for 01/08/24 @1 :30 PM Determined patient continues to eat 2 meals per day, his meals come [employer] and [employer] Instructed patient to continue to drink Ensure 1-2 times daily for protein supplementation and patient verbalizes understanding, sent request to Leita Lyme, VBCI admin assistant to mail Ensure Coupons Discussed plans with patient for ongoing care coordination follow up and provided patient with direct contact information for nurse care coordinator Body mass index is 23.7 kg/m.     Wt Readings from Last 3 Encounters:  11/11/23 165 lb 3.2 oz (74.9 kg)  05/27/23 150 lb 3.2 oz (68.1 kg)  02/19/23 151 lb (68.5 kg)     Interventions Today    Flowsheet Row Most Recent Value  Chronic Disease   Chronic disease during today's visit Other  [Self-Care deficit]  General Interventions   General Interventions Discussed/Reviewed General Interventions Discussed, General Interventions Reviewed, Communication with  Communication with Social Work  Jeb Moose BSW]  Education Interventions   Education Provided Provided Education  Provided Verbal Education On Nutrition  Nutrition Interventions   Nutrition Discussed/Reviewed Nutrition Reviewed, Nutrition Discussed,  Increasing proteins, Supplemental nutrition          SDOH assessments and interventions completed:  No     Care Coordination Interventions:  Yes, provided   Follow up plan: Follow up call scheduled for 01/25/24 @1 :30 PM    Encounter Outcome:  Patient Visit Completed

## 2023-12-28 NOTE — Progress Notes (Signed)
 Pt rescheduled with BSW during appt today by RN

## 2024-01-08 ENCOUNTER — Ambulatory Visit: Payer: Self-pay | Admitting: Licensed Clinical Social Worker

## 2024-01-08 NOTE — Patient Outreach (Signed)
  Care Coordination   01/08/2024 Name: Nathan Morse MRN: [id number] DOB: [birth year]   Care Coordination Outreach Attempts:  An unsuccessful outreach was attempted for an appointment today.  Follow Up Plan:  Additional outreach attempts will be made to offer the patient complex care management information and services.   Encounter Outcome:  No Answer   Care Coordination Interventions:  No, not indicated   This was the 2nd attempt for an initial, SW will reroute back to care guides to reschedule with SW  Jeanie Cooks, PhD [facility], [facility] Social Worker Direct Dial: [phone]  Fax: [phone]

## 2024-01-13 ENCOUNTER — Telehealth: Payer: Self-pay | Admitting: *Deleted

## 2024-01-13 NOTE — Progress Notes (Signed)
Complex Care Management Care Guide Note  01/13/2024 Name: Mikail Goostree MRN: [id number] DOB: 07-13-1934  Granite Godman is a 89 y.o. year old male who is a primary care patient of [name], FNP and is actively engaged with the care management team. I reached out to [name] by phone today to assist with re-scheduling  with the BSW.  Follow up plan: Unsuccessful telephone outreach attempt made. A HIPAA compliant phone message was left for the patient providing contact information and requesting a return call.  Burman Nieves, CMA, Care Guide [facility]  [facility] Guide Direct Dial: [phone]  Fax: [phone] Website: [email or web address]

## 2024-01-19 NOTE — Progress Notes (Signed)
 Complex Care Management Care Guide Note  01/19/2024 Name: Nathan Morse MRN: [id number] DOB: January 03, 1934  Nathan Morse is a 89 y.o. year old male who is a primary care patient of Georgina Speaks, FNP and is actively engaged with the care management team. I reached out to Legrand Gata by phone today to assist with re-scheduling  with the BSW.  Follow up plan: Telephone appointment with complex care management team member scheduled for:  02/04/2024  Thedford Franks, CMA, Care Guide [facility], [facility] Guide Direct Dial: [phone]  Fax: [phone] Website: [email or web address]

## 2024-01-22 ENCOUNTER — Ambulatory Visit: Payer: Self-pay

## 2024-01-25 ENCOUNTER — Ambulatory Visit: Payer: Self-pay

## 2024-01-25 ENCOUNTER — Ambulatory Visit (INDEPENDENT_AMBULATORY_CARE_PROVIDER_SITE_OTHER): Payer: Medicare PPO

## 2024-01-25 VITALS — BP 126/70 | HR 85 | Temp 98.6°F | Ht 70.0 in | Wt 162.0 lb

## 2024-01-25 DIAGNOSIS — E538 Deficiency of other specified B group vitamins: Secondary | ICD-10-CM

## 2024-01-25 MED ORDER — CYANOCOBALAMIN 1000 MCG/ML IJ SOLN
1000.0000 ug | Freq: Once | INTRAMUSCULAR | Status: AC
Start: 2024-01-25 — End: 2024-01-25
  Administered 2024-01-25: 1000 ug via INTRAMUSCULAR

## 2024-01-25 NOTE — Patient Outreach (Signed)
  Care Coordination   Follow Up Visit Note   01/25/2024 Name: Nathan Morse MRN: [id number] DOB: Dec 06, 1934  Nathan Morse is a 89 y.o. year old male who sees [name], FNP for primary care. I spoke with  Nathan Morse by phone today.  What matters to the patients health and wellness today?  Patient would like to have help with meal preparation.     Goals Addressed             This Visit's Progress    To get help with preparing meals in order to eat better       Care Coordination Interventions: Evaluation of current treatment plan related to Nutritional Imbalance and patient's adherence to plan as established by provider Determined patient missed the call from SW [facility], rescheduled call for 02/04/24 @11 :00 AM Reviewed and discussed recent B12 injection, patient has 1 subsequent injection remaining, he feels a Pope Brunty more energetic with improved appetite since receiving these injections  Discussed plans with patient for ongoing care coordination follow up and provided patient with direct contact information for nurse care coordinator Body mass index is 23.3 kg/m.     Wt Readings from Last 3 Encounters:  12/21/23 162 lb 6.4 oz (73.7 kg)  12/04/23 165 lb (74.8 kg)  11/26/23 165 lb (74.8 kg)     Interventions Today    Flowsheet Row Most Recent Value  Chronic Disease   Chronic disease during today's visit Other  [Vitamin B12 deficiency]  General Interventions   General Interventions Discussed/Reviewed General Interventions Discussed, General Interventions Reviewed, Doctor Visits  Doctor Visits Discussed/Reviewed Doctor Visits Discussed, Doctor Visits Reviewed, PCP  Education Interventions   Education Provided Provided Education  Provided Verbal Education On Nutrition, Medication, When to see the doctor  Nutrition Interventions   Nutrition Discussed/Reviewed Nutrition Discussed, Nutrition Reviewed  Pharmacy Interventions   Pharmacy Dicussed/Reviewed Pharmacy Topics  Reviewed, Pharmacy Topics Discussed, Medications and their functions          SDOH assessments and interventions completed:  No     Care Coordination Interventions:  Yes, provided   Follow up plan: Follow up call scheduled for 02/22/24 @1 :00 PM    Encounter Outcome:  Patient Visit Completed

## 2024-01-25 NOTE — Progress Notes (Signed)
Patient presents today for his b12 shot. Patient has no other concerns today.

## 2024-01-25 NOTE — Patient Instructions (Addendum)
 Visit Information  Thank you for taking time to visit with me today. Please don't hesitate to contact me if I can be of assistance to you.   Following are the goals we discussed today:   Goals Addressed             This Visit's Progress    To get help with preparing meals in order to eat better       Care Coordination Interventions: Evaluation of current treatment plan related to Nutritional Imbalance and patient's adherence to plan as established by provider Determined patient missed the call from SW [facility], rescheduled call for 02/04/24 @11 :00 AM Reviewed and discussed recent B12 injection, patient has 1 subsequent injection remaining, he feels a Blayn Whetsell more energetic with improved appetite since receiving these injections  Discussed plans with patient for ongoing care coordination follow up and provided patient with direct contact information for nurse care coordinator Body mass index is 23.3 kg/m.     Wt Readings from Last 3 Encounters:  12/21/23 162 lb 6.4 oz (73.7 kg)  12/04/23 165 lb (74.8 kg)  11/26/23 165 lb (74.8 kg)              Our next appointment is by telephone on 02/22/24 at 1:00 PM  Please call the care guide team at [phone] if you need to cancel or reschedule your appointment.   If you are experiencing a Mental Health or Behavioral Health Crisis or need someone to talk to, please call 1-800-273-TALK (toll free, 24 hour hotline)  The patient verbalized understanding of instructions, educational materials, and care plan provided today and DECLINED offer to receive copy of patient instructions, educational materials, and care plan.   Louanne Roussel RN BSN CCM [place]  [facility], [facility] Nurse Care Coordinator  Direct Dial: [phone] Website: [email or web address]

## 2024-02-03 ENCOUNTER — Ambulatory Visit (INDEPENDENT_AMBULATORY_CARE_PROVIDER_SITE_OTHER): Payer: Medicare PPO | Admitting: Podiatry

## 2024-02-03 ENCOUNTER — Encounter: Payer: Self-pay | Admitting: Podiatry

## 2024-02-03 VITALS — Ht 70.0 in | Wt 162.0 lb

## 2024-02-03 DIAGNOSIS — M79674 Pain in right toe(s): Secondary | ICD-10-CM

## 2024-02-03 DIAGNOSIS — M79675 Pain in left toe(s): Secondary | ICD-10-CM

## 2024-02-03 DIAGNOSIS — N1831 Chronic kidney disease, stage 3a: Secondary | ICD-10-CM | POA: Diagnosis not present

## 2024-02-03 DIAGNOSIS — L84 Corns and callosities: Secondary | ICD-10-CM | POA: Diagnosis not present

## 2024-02-03 DIAGNOSIS — B351 Tinea unguium: Secondary | ICD-10-CM

## 2024-02-03 NOTE — Progress Notes (Signed)
  Subjective:  Patient ID: Nathan Morse, male    DOB: 1934/12/07,  MRN: [id number]  Shoichi Mielke presents to clinic today for at risk foot care with h/o CKD and corn(s) b/l feet, callus(es) b/l feet and painful mycotic nails.  Pain interferes with ambulation. Aggravating factors include wearing enclosed shoe gear. Painful toenails interfere with ambulation. Aggravating factors include wearing enclosed shoe gear. Pain is relieved with periodic professional debridement. Painful corns and calluses are aggravated when weightbearing with and without shoegear. Pain is relieved with periodic professional debridement.  Chief Complaint  Patient presents with   RFC    He is here for a nail trim,. PCP is Marianne Sofia, last seen 3-4 months ago.,    New problem(s): None.   PCP is [name], FNP.  No Known Allergies  Review of Systems: Negative except as noted in the HPI.  Objective: There were no vitals filed for this visit. Anirudh Baiz is a pleasant 89 y.o. male WD, WN in NAD. AAO x 3.  Vascular Examination: Capillary refill time <3 seconds b/l LE. Palpable pedal pulses b/l LE. Digital hair sparse b/l. No pedal edema b/l. Skin temperature gradient WNL b/l. No varicosities b/l. Marland Kitchen  Dermatological Examination: Pedal skin with normal turgor, texture and tone b/l. No open wounds. No interdigital macerations b/l. Toenails 1-5 b/l thickened, discolored, dystrophic with subungual debris. There is pain on palpation to dorsal aspect of nailplates. Hyperkeratotic lesion(s)  left 5th toe and submet head 5 left foot.  No erythema, no edema, no drainage, no fluctuance..  Neurological Examination: Protective sensation intact with 10 gram monofilament b/l LE. Vibratory sensation intact b/l LE.   Musculoskeletal Examination: Muscle strength 5/5 to all lower extremity muscle groups bilaterally. Hammertoe(s) noted to the L 5th toe and R 5th toe.  Assessment/Plan: 1. Pain due to onychomycosis of toenails of  both feet   2. Corns and callosities   3. Pain in toes of both feet     Patient was evaluated and treated. All patient's and/or POA's questions/concerns addressed on today's visit. Toenails 1-5 debrided in length and girth without incident. Corn(s) and Callus(es) L 5th toe and submet head 5 left foot pared with sharp debridement without incident. Continue daily foot inspections and monitor blood glucose per PCP/Endocrinologist's recommendations Continue soft, supportive shoe gear daily. Report any pedal injuries to medical professional. Call office if there are any questions/concerns. -Patient/POA to call should there be question/concern in the interim.   Return in about 3 months (around 05/02/2024).  Freddie Breech, DPM      [place] LOCATION: [address], [place] [address]                   Office [phone]   [facility] LOCATION: [address] [place], [place] [address] Office [phone]

## 2024-02-04 ENCOUNTER — Ambulatory Visit: Payer: Self-pay | Admitting: Licensed Clinical Social Worker

## 2024-02-04 NOTE — Patient Outreach (Signed)
 Care Coordination   Initial Visit Note   02/04/2024 Name: Nathan Morse MRN: [id number] DOB: 07-25-1934  Nathan Morse is a 89 y.o. year old male who sees Nathan Felts, FNP for primary care. I spoke with  Nathan Morse by phone today.  What matters to the patients health and wellness today?  Medicaid and SDOH    Goals Addressed             This Visit's Progress    Care Coordination Activities       Care Coordination Interventions: Patient stated that he has spoke to several people about Medicaid and they told him that he may not be approved due to his age Patient stated that he does not know how to read or write and would need help the SW educated the patient on the Walk in clinic and how a referral can be made to them and also offered to assist with completing the application, but the patient was headed out to go get breakfast. Patient agreed to having the Medicaid worker call him  The SW went over the SDOH needs and no other needs at this time.  Sw will follow on 02/23/2024 at 11:30 am        SDOH assessments and interventions completed:  Yes  SDOH Interventions Today    Flowsheet Row Most Recent Value  SDOH Interventions   Food Insecurity Interventions Intervention Not Indicated  [Patient stated that he likes going to [employer] and [place] [employer]]  Housing Interventions Intervention Not Indicated  Transportation Interventions Intervention Not Indicated  [Patient drives]  Utilities Interventions Intervention Not Indicated        Care Coordination Interventions:  Yes, provided  Interventions Today    Flowsheet Row Most Recent Value  General Interventions   General Interventions Discussed/Reviewed General Interventions Discussed, Community Resources  [Referral sent to the [employer] in clinic]        Follow up plan: Follow up call scheduled for 02/23/2024 at 11:30 am    Encounter Outcome:  Patient Visit Completed   Nathan Cooks, PhD [facility], [facility] Social Worker Direct Dial: [phone]  Fax: [phone]

## 2024-02-04 NOTE — Patient Instructions (Signed)
 Visit Information  Thank you for taking time to visit with me today. Please don't hesitate to contact me if I can be of assistance to you.   Following are the goals we discussed today:   Goals Addressed             This Visit's Progress    Care Coordination Activities       Care Coordination Interventions: Patient stated that he has spoke to several people about Medicaid and they told him that he may not be approved due to his age Patient stated that he does not know how to read or write and would need help the SW educated the patient on the Walk in clinic and how a referral can be made to them and also offered to assist with completing the application, but the patient was headed out to go get breakfast. Patient agreed to having the Medicaid worker call him  The SW went over the SDOH needs and no other needs at this time.  Sw will follow on 02/23/2024 at 11:30 am        Our next appointment is by telephone on 02/23/2024 at 11:30 am  Please call the care guide team at [phone] if you need to cancel or reschedule your appointment.   If you are experiencing a Mental Health or Behavioral Health Crisis or need someone to talk to, please call the Suicide and Crisis Lifeline: 988 go to [facility] [address], [place] [phone]) call 911  The patient verbalized understanding of instructions, educational materials, and care plan provided today and DECLINED offer to receive copy of patient instructions, educational materials, and care plan.   Jeanie Cooks, PhD [facility], [facility] Social Worker Direct Dial: [phone]  Fax: [phone]

## 2024-02-12 DIAGNOSIS — C61 Malignant neoplasm of prostate: Secondary | ICD-10-CM | POA: Diagnosis not present

## 2024-02-12 DIAGNOSIS — Z8546 Personal history of malignant neoplasm of prostate: Secondary | ICD-10-CM | POA: Diagnosis not present

## 2024-02-12 DIAGNOSIS — N5201 Erectile dysfunction due to arterial insufficiency: Secondary | ICD-10-CM | POA: Diagnosis not present

## 2024-02-12 DIAGNOSIS — R3915 Urgency of urination: Secondary | ICD-10-CM | POA: Diagnosis not present

## 2024-02-22 ENCOUNTER — Ambulatory Visit: Payer: Self-pay

## 2024-02-22 NOTE — Patient Instructions (Signed)
 Visit Information  Thank you for taking time to visit with me today. Please don't hesitate to contact me if I can be of assistance to you.   Following are the goals we discussed today:   Goals Addressed             This Visit's Progress    To get help with preparing meals in order to eat better   On track    Care Coordination Interventions: Evaluation of current treatment plan related to Nutritional Imbalance and patient's adherence to plan as established by provider Spoke with patient, confirmed he completed his Vitamin B12 injections per his PCP, he feels more energetic  Determined patient was directed by SW Remigio Eisenmenger regarding walk in clinic to help determine Medicaid eligibility, he has yet to follow up on this but plans to do so once his friend is available to accompany him  Reviewed and discussed patient's next scheduled follow up call with Remigio Eisenmenger BSW, set for 02/23/24 @11 :30 AM Reviewed and discussed patient's next scheduled PCP office visit with Dr. Alvis Lemmings scheduled for 04/11/24 @10 :40 AM Discussed plans with patient for ongoing care coordination follow up and provided patient with direct contact information for nurse care coordinator Scheduled next nurse follow up call with patient for 04/13/24 @09 :30 AM          Our next appointment is by telephone on 04/13/24 at 09:30 AM  Please call the care guide team at [phone] if you need to cancel or reschedule your appointment.   If you are experiencing a Mental Health or Behavioral Health Crisis or need someone to talk to, please call 1-800-273-TALK (toll free, 24 hour hotline)  The patient verbalized understanding of instructions, educational materials, and care plan provided today and DECLINED offer to receive copy of patient instructions, educational materials, and care plan.   Delsa Sale RN BSN CCM [place]  [facility], [facility] Nurse Care Coordinator  Direct Dial:  [phone] Website: [email or web address]

## 2024-02-22 NOTE — Patient Outreach (Signed)
 Care Coordination   Follow Up Visit Note   02/22/2024 Name: Nathan Morse MRN: [id number] DOB: 1934/02/15  Nathan Morse is a 89 y.o. year old male who sees [name], FNP for primary care. I spoke with  Nathan Morse by phone today.  What matters to the patients health and wellness today?  Patient would like to continue to feel energetic and manage his chronic health conditions.     Goals Addressed             This Visit's Progress    To get help with preparing meals in order to eat better   On track    Care Coordination Interventions: Evaluation of current treatment plan related to Nutritional Imbalance and patient's adherence to plan as established by provider Spoke with patient, confirmed he completed his Vitamin B12 injections per his PCP, he feels more energetic  Determined patient was directed by SW Remigio Eisenmenger regarding walk in clinic to help determine Medicaid eligibility, he has yet to follow up on this but plans to do so once his friend is available to accompany him  Reviewed and discussed patient's next scheduled follow up call with Remigio Eisenmenger BSW, set for 02/23/24 @11 :30 AM Reviewed and discussed patient's next scheduled PCP office visit with Dr. Alvis Lemmings scheduled for 04/11/24 @10 :40 AM Discussed plans with patient for ongoing care coordination follow up and provided patient with direct contact information for nurse care coordinator Scheduled next nurse follow up call with patient for 04/13/24 @09 :30 AM      Interventions Today    Flowsheet Row Most Recent Value  Chronic Disease   Chronic disease during today's visit Other  [low Vitamin B12]  General Interventions   General Interventions Discussed/Reviewed General Interventions Reviewed, General Interventions Discussed, Labs, Doctor Visits  Doctor Visits Discussed/Reviewed PCP, Doctor Visits Reviewed, Doctor Visits Discussed  Education Interventions   Education Provided Provided Education  Provided Verbal Education  On When to see the doctor, Medication  Pharmacy Interventions   Pharmacy Dicussed/Reviewed Pharmacy Topics Reviewed, Pharmacy Topics Discussed, Medications and their functions          SDOH assessments and interventions completed:  No     Care Coordination Interventions:  Yes, provided   Follow up plan: Follow up call scheduled for 04/13/24 @09 :30 AM    Encounter Outcome:  Patient Visit Completed

## 2024-02-23 ENCOUNTER — Ambulatory Visit: Payer: Self-pay | Admitting: Licensed Clinical Social Worker

## 2024-02-23 NOTE — Patient Outreach (Signed)
 Care Coordination   Follow Up Visit Note   02/23/2024 Name: Nathan Morse MRN: [id number] DOB: 1934/09/14  Nathan Morse is a 89 y.o. year old male who sees Nathan Felts, Nathan Morse for primary care. I spoke with  [name] and his friend Lavena Bullion by phone today.  What matters to the patients health and wellness today?  Medicaid Application     Goals Addressed             This Visit's Progress    Care Coordination Activities   On track    Care Coordination Interventions: Patient stated that he has spoke to several people about Medicaid and they told him that he may not be approved due to his age Patient stated that he does not know how to read or write and would need help the SW educated the patient on the Walk in clinic and how a referral can be made to them and also offered to assist with completing the application, but the patient was headed out to go get breakfast. Patient agreed to having the Medicaid worker call him, SW will do another referral.  The SW went over the SDOH needs and no other needs at this time.  Sw will follow on 03/10/2024 at 10:30 am        SDOH assessments and interventions completed:  Yes  SDOH Interventions Today    Flowsheet Row Most Recent Value  SDOH Interventions   Food Insecurity Interventions Intervention Not Indicated  Housing Interventions Intervention Not Indicated  Transportation Interventions Intervention Not Indicated  Utilities Interventions Intervention Not Indicated        Care Coordination Interventions:  Yes, provided  Interventions Today    Flowsheet Row Most Recent Value  General Interventions   General Interventions Discussed/Reviewed General Interventions Reviewed, Community Resources  [facility] referral to walk in clinic]        Follow up plan: Follow up call scheduled for 03/10/2024 at 10:30 am    Encounter Outcome:  Patient Visit Completed   Jeanie Cooks, PhD [facility],  [facility] Social Worker Direct Dial: [phone]  Fax: [phone]

## 2024-02-23 NOTE — Patient Instructions (Signed)
 Visit Information  Thank you for taking time to visit with me today. Please don't hesitate to contact me if I can be of assistance to you.   Following are the goals we discussed today:   Goals Addressed             This Visit's Progress    Care Coordination Activities   On track    Care Coordination Interventions: Patient stated that he has spoke to several people about Medicaid and they told him that he may not be approved due to his age Patient stated that he does not know how to read or write and would need help the SW educated the patient on the Walk in clinic and how a referral can be made to them and also offered to assist with completing the application, but the patient was headed out to go get breakfast. Patient agreed to having the Medicaid worker call him, SW will do another referral.  The SW went over the SDOH needs and no other needs at this time.  Sw will follow on 03/10/2024 at 10:30 am        Our next appointment is by telephone on 03/10/2024 at 10:30 am  Please call the care guide team at [phone] if you need to cancel or reschedule your appointment.   If you are experiencing a Mental Health or Behavioral Health Crisis or need someone to talk to, please call the Suicide and Crisis Lifeline: 988 go to [facility] [address] [place] [phone]) call 911  The patient verbalized understanding of instructions, educational materials, and care plan provided today and DECLINED offer to receive copy of patient instructions, educational materials, and care plan.   Jeanie Cooks, PhD [facility], [facility] Social Worker Direct Dial: [phone]  Fax: [phone]

## 2024-03-10 ENCOUNTER — Ambulatory Visit: Payer: Self-pay | Admitting: Licensed Clinical Social Worker

## 2024-03-10 NOTE — Patient Outreach (Signed)
 Care Coordination   03/10/2024 Name: Nathan Morse MRN: [id number] DOB: 01-04-1934   Care Coordination Outreach Attempts:  An unsuccessful outreach was attempted for an appointment today.  Follow Up Plan:  Additional outreach attempts will be made to offer the patient complex care management information and services.   Encounter Outcome:  No Answer   Care Coordination Interventions:  No, not indicated    Sw will attempt another call on 03/17/2024 at 2:00 pm  Jeanie Cooks, PhD [facility], [facility] Social Worker Direct Dial: [phone]  Fax: [phone]

## 2024-03-17 ENCOUNTER — Ambulatory Visit: Payer: Self-pay | Admitting: Licensed Clinical Social Worker

## 2024-03-17 NOTE — Patient Outreach (Signed)
 Care Coordination   Follow Up Visit Note   03/17/2024 Name: Nathan Morse MRN: [id number] DOB: 11-Dec-1934  Nathan Morse is a 89 y.o. year old male who sees [name], FNP for primary care. I spoke with  Nathan Morse by phone today.  What matters to the patients health and wellness today?  Medicaid patient wanted SW to call his friend Nathan Morse to provide the information for the walk in clinic and address so that the can go     Goals Addressed             This Visit's Progress    Care Coordination Activities   On track    Care Coordination Interventions: Patient stated that he has spoke to several people about Medicaid and they told him that he may not be approved due to his age Patient stated that he does not know how to read or write and would need help the SW educated the patient on the Walk in clinic and how a referral can be made to them and also offered to assist with completing the application, but the patient was headed out to go get breakfast. Patient agreed to having the Medicaid worker call him, SW will do another referral.  The SW went over the SDOH needs and no other needs at this time.  Sw will follow on 03/31/2024 at 2:00 pm        SDOH assessments and interventions completed:  Yes  SDOH Interventions Today    Flowsheet Row Most Recent Value  SDOH Interventions   Food Insecurity Interventions Intervention Not Indicated  Housing Interventions Intervention Not Indicated  Transportation Interventions Intervention Not Indicated  Utilities Interventions Intervention Not Indicated        Care Coordination Interventions:  Yes, provided  Interventions Today    Flowsheet Row Most Recent Value  General Interventions   General Interventions Discussed/Reviewed General Interventions Reviewed, [employer] is going to go to the walk in clinic]        Follow up plan: Follow up call scheduled for 03/31/2024 at 2:00 pm    Encounter Outcome:   Patient Visit Completed   Jeanie Cooks, PhD [facility], [facility] Social Worker Direct Dial: [phone]  Fax: [phone]

## 2024-03-17 NOTE — Patient Instructions (Signed)
 Visit Information  Thank you for taking time to visit with me today. Please don't hesitate to contact me if I can be of assistance to you.   Following are the goals we discussed today:   Goals Addressed             This Visit's Progress    Care Coordination Activities   On track    Care Coordination Interventions: Patient stated that he has spoke to several people about Medicaid and they told him that he may not be approved due to his age Patient stated that he does not know how to read or write and would need help the SW educated the patient on the Walk in clinic and how a referral can be made to them and also offered to assist with completing the application, but the patient was headed out to go get breakfast. Patient agreed to having the Medicaid worker call him, SW will do another referral.  The SW went over the SDOH needs and no other needs at this time.  Sw will follow on 03/31/2024 at 2:00 pm        Our next appointment is by telephone on 03/31/2024 at 2:00 pm  Please call the care guide team at [phone] if you need to cancel or reschedule your appointment.   If you are experiencing a Mental Health or Behavioral Health Crisis or need someone to talk to, please call the Suicide and Crisis Lifeline: 988 go to [facility] [address], [place] [phone]) call 911  The patient verbalized understanding of instructions, educational materials, and care plan provided today and DECLINED offer to receive copy of patient instructions, educational materials, and care plan.   Jeanie Cooks, PhD [facility], [facility] Social Worker Direct Dial: [phone]  Fax: [phone]

## 2024-03-31 ENCOUNTER — Encounter: Admitting: Licensed Clinical Social Worker

## 2024-04-07 ENCOUNTER — Other Ambulatory Visit: Payer: Self-pay | Admitting: Licensed Clinical Social Worker

## 2024-04-07 NOTE — Patient Outreach (Addendum)
 Complex Care Management   Visit Note  04/07/2024  Name:  Nathan Morse MRN: [id number] DOB: Jan 02, 1934  Situation: Referral received for Complex Care Management related to  [employer]  I obtained verbal consent from Patient.  Visit completed with patient  on the phone  Background:   Past Medical History:  Diagnosis Date   Cancer [facility])    Muscle spasm 05/19/2019   Seborrheic keratosis, inflamed 10/16/2020   Skin Surgery    Urinary tract infection with hematuria 03/22/2019    Assessment: Patient stated that he is fine for now and he has [facility] and that his frined Amalia Badder helps him out around the [place] and he likes going to [employer] and out to eat     Recommendation:   No provider recommendation at this time  Follow Up Plan:   Closing From:  Complex Care Management  Jonda Neighbours, PhD [facility], [facility] Social Worker Direct Dial: [phone]  Fax: [phone]

## 2024-04-07 NOTE — Patient Instructions (Signed)

## 2024-04-11 ENCOUNTER — Encounter: Payer: Self-pay | Admitting: Nurse Practitioner

## 2024-04-11 ENCOUNTER — Ambulatory Visit (INDEPENDENT_AMBULATORY_CARE_PROVIDER_SITE_OTHER): Payer: Medicare HMO | Admitting: Nurse Practitioner

## 2024-04-11 VITALS — BP 110/80 | HR 63 | Temp 98.3°F | Ht 70.0 in | Wt 163.0 lb

## 2024-04-11 DIAGNOSIS — R634 Abnormal weight loss: Secondary | ICD-10-CM

## 2024-04-11 DIAGNOSIS — N1831 Chronic kidney disease, stage 3a: Secondary | ICD-10-CM | POA: Diagnosis not present

## 2024-04-11 DIAGNOSIS — Z960 Presence of urogenital implants: Secondary | ICD-10-CM | POA: Insufficient documentation

## 2024-04-11 DIAGNOSIS — I700 Atherosclerosis of aorta: Secondary | ICD-10-CM

## 2024-04-11 DIAGNOSIS — N5234 Erectile dysfunction following simple prostatectomy: Secondary | ICD-10-CM

## 2024-04-11 NOTE — Patient Instructions (Signed)
 [facility] [phone]

## 2024-04-11 NOTE — Progress Notes (Signed)
 I,Jameka J Llittleton, CMA,acting as a Neurosurgeon for [employer], FNP.,have documented all relevant documentation on the behalf of Nathan Epley, FNP,as directed by  Nathan Epley, FNP while in the presence of Nathan Epley, FNP.  Subjective:  Patient ID: Nathan Morse , male    DOB: [birth year] , 89 y.o.   MRN: [id number]  Chief Complaint  Patient presents with   Weight Loss    HPI  Patient presents today for weight loss. Patient reports he is not eating well. He reports it takes him 2 days to eat one meal. He reports his sex life is just gone, he reports the pump that was put in is just not working. He chews tobacco daily.   Patient is here for a follow-up appointment. The main concern is a urinary pump that is no longer functioning properly. Patient reports, 'They put a pump in and it don't work no more.' Additionally, the patient mentions urinary urgency, stating, 'When I got to go, I got to go. I mean right then.' Patient initially thought they were losing weight, but it was confirmed that their weight is stable. The patient also reports a lack of sexual function, saying, 'My sex life don't go, don't don't even exist.' Patient tried Viagra  but reports it didn't work. The timeline for the pump insertion and prostate removal is unclear, but it predates the current doctor-patient relationship.     Past Medical History:  Diagnosis Date   Cancer [facility])    Muscle spasm 05/19/2019   Seborrheic keratosis, inflamed 10/16/2020   Skin Surgery    Urinary tract infection with hematuria 03/22/2019     Family History  Adopted: Yes  Family history unknown: Yes     Current Outpatient Medications:    atorvastatin  (LIPITOR) 10 MG tablet, TAKE 1 TABLET(10 MG) BY MOUTH DAILY, Disp: 90 tablet, Rfl: 1   cyanocobalamin  (VITAMIN B12) 1000 MCG tablet, Take 1 tablet (1,000 mcg total) by mouth daily., Disp: 90 tablet, Rfl: 1   docusate sodium (COLACE) 100 MG capsule, Take 100 mg by mouth 2 (two) times daily.,  Disp: , Rfl:    linaclotide  (LINZESS ) 145 MCG CAPS capsule, Take 1 capsule (145 mcg total) by mouth daily before breakfast., Disp: 90 capsule, Rfl: 1   mirtazapine  (REMERON ) 7.5 MG tablet, TAKE 1 TABLET(7.5 MG) BY MOUTH AT BEDTIME, Disp: 90 tablet, Rfl: 1   oxybutynin  (DITROPAN  XL) 10 MG 24 hr tablet, Take 1 tablet (10 mg total) by mouth at bedtime., Disp: 90 tablet, Rfl: 1   No Known Allergies   Review of Systems  Constitutional: Negative.   Respiratory: Negative.    Cardiovascular: Negative.   Neurological: Negative.   Psychiatric/Behavioral: Negative.       Today's Vitals   04/11/24 1024  BP: 110/80  Pulse: 63  Temp: 98.3 F (36.8 C)  TempSrc: Oral  Weight: 163 lb (73.9 kg)  Height: 5\' 10"  (1.778 m)  PainSc: 0-No pain   Body mass index is 23.39 kg/m.  Wt Readings from Last 3 Encounters:  04/11/24 163 lb (73.9 kg)  02/03/24 162 lb (73.5 kg)  01/25/24 162 lb (73.5 kg)     Objective:  Physical Exam Vitals and nursing note reviewed.  Constitutional:      General: He is not in acute distress.    Appearance: Normal appearance.  HENT:     Mouth/Throat:     Dentition: Normal dentition. Does not have dentures.  Cardiovascular:     Rate and Rhythm: Normal rate and regular  rhythm.     Pulses: Normal pulses.     Heart sounds: Normal heart sounds. No murmur heard. Pulmonary:     Effort: Pulmonary effort is normal. No respiratory distress.     Breath sounds: Normal breath sounds. No wheezing.  Abdominal:     General: Abdomen is flat. Bowel sounds are normal. There is no distension.     Palpations: Abdomen is soft.     Tenderness: There is no abdominal tenderness.  Skin:    General: Skin is warm and dry.     Capillary Refill: Capillary refill takes less than 2 seconds.  Neurological:     General: No focal deficit present.     Mental Status: He is alert and oriented to person, place, and time.     Cranial Nerves: No cranial nerve deficit.     Motor: No weakness.   Psychiatric:        Mood and Affect: Mood normal.        Behavior: Behavior normal.        Thought Content: Thought content normal.        Judgment: Judgment normal.      Assessment And Plan:  Stage 3a chronic kidney disease (HCC) Assessment & Plan: Stable continue to stay well hydrated with water.   Orders: -     BMP8+eGFR  Aortic atherosclerosis (HCC) Assessment & Plan: Continue statin, tolerating well.  Orders: -     Lipid panel  Unintended weight loss Assessment & Plan: His weight is stable. He has seen GI for a workup of weight loss.   Orders: -     Vitamin B12 -     CBC with Differential/Platelet  History of penile implant -     Ambulatory referral to Urology  Erectile dysfunction following simple prostatectomy Assessment & Plan:    - Non-functioning urinary pump    - History of prostate removal    - Urinary urgency I have given him the number for [facility] as he is an active patient. I have also placed a referral as well since related to a new problem   Orders: -     Ambulatory referral to Urology    No follow-ups on file.  Patient was given opportunity to ask questions. Patient verbalized understanding of the plan and was able to repeat key elements of the plan. All questions were answered to their satisfaction.    Nathan Mangle, FNP, have reviewed all documentation for this visit. The documentation on 04/12/24 for the exam, diagnosis, procedures, and orders are all accurate and complete.   IF YOU HAVE BEEN REFERRED TO A SPECIALIST, IT MAY TAKE 1-2 WEEKS TO SCHEDULE/PROCESS THE REFERRAL. IF YOU HAVE NOT HEARD FROM US /SPECIALIST IN TWO WEEKS, PLEASE GIVE US  A CALL AT [phone] X [phone].

## 2024-04-11 NOTE — Assessment & Plan Note (Signed)
-   Non-functioning urinary pump    - History of prostate removal    - Urinary urgency I have given him the number for [facility] as he is an active patient. I have also placed a referral as well since related to a new problem

## 2024-04-12 LAB — LIPID PANEL
Chol/HDL Ratio: 2.5 ratio (ref 0.0–5.0)
Cholesterol, Total: 123 mg/dL (ref 100–199)
HDL: 50 mg/dL (ref >39)
LDL Chol Calc (NIH): 62 mg/dL (ref 0–99)
Triglycerides: 48 mg/dL (ref 0–149)
VLDL Cholesterol Cal: 11 mg/dL (ref 5–40)

## 2024-04-12 LAB — CBC WITH DIFFERENTIAL/PLATELET
Basophils Absolute: 0 10*3/uL (ref 0.0–0.2)
Basos: 1 %
EOS (ABSOLUTE): 0 10*3/uL (ref 0.0–0.4)
Eos: 1 %
Hematocrit: 40.4 % (ref 37.5–51.0)
Hemoglobin: 12.6 g/dL — ABNORMAL LOW (ref 13.0–17.7)
Immature Grans (Abs): 0 10*3/uL (ref 0.0–0.1)
Immature Granulocytes: 0 %
Lymphocytes Absolute: 1.2 10*3/uL (ref 0.7–3.1)
Lymphs: 31 %
MCH: 27.8 pg (ref 26.6–33.0)
MCHC: 31.2 g/dL — ABNORMAL LOW (ref 31.5–35.7)
MCV: 89 fL (ref 79–97)
Monocytes Absolute: 0.3 10*3/uL (ref 0.1–0.9)
Monocytes: 7 %
Neutrophils Absolute: 2.2 10*3/uL (ref 1.4–7.0)
Neutrophils: 60 %
Platelets: 129 10*3/uL — ABNORMAL LOW (ref 150–450)
RBC: 4.53 x10E6/uL (ref 4.14–5.80)
RDW: 11.9 % (ref 11.6–15.4)
WBC: 3.7 10*3/uL (ref 3.4–10.8)

## 2024-04-12 LAB — BMP8+EGFR
BUN/Creatinine Ratio: 9 — ABNORMAL LOW (ref 10–24)
BUN: 12 mg/dL (ref 8–27)
CO2: 25 mmol/L (ref 20–29)
Calcium: 8.9 mg/dL (ref 8.6–10.2)
Chloride: 102 mmol/L (ref 96–106)
Creatinine, Ser: 1.4 mg/dL — ABNORMAL HIGH (ref 0.76–1.27)
Glucose: 91 mg/dL (ref 70–99)
Potassium: 4 mmol/L (ref 3.5–5.2)
Sodium: 139 mmol/L (ref 134–144)
eGFR: 49 mL/min/{1.73_m2} — ABNORMAL LOW (ref >59)

## 2024-04-12 LAB — VITAMIN B12: Vitamin B-12: 309 pg/mL (ref 232–1245)

## 2024-04-12 NOTE — Assessment & Plan Note (Signed)
 His weight is stable. He has seen GI for a workup of weight loss.

## 2024-04-12 NOTE — Assessment & Plan Note (Signed)
 Continue statin, tolerating well

## 2024-04-12 NOTE — Assessment & Plan Note (Signed)
 Stable continue to stay well hydrated with water.

## 2024-04-13 ENCOUNTER — Ambulatory Visit: Payer: Self-pay

## 2024-04-14 NOTE — Patient Instructions (Signed)
 Visit Information  Thank you for taking time to visit with me today. Please don't hesitate to contact me if I can be of assistance to you before our next scheduled appointment.  Our next appointment is by telephone on Wednesday, May 28 at 11:30 AM Please call the care guide team at [phone] if you need to cancel or reschedule your appointment.   Following is a copy of your care plan:   Goals Addressed             This Visit's Progress    COMPLETED: RNCM Care Management Expected Outcome:  Monitor, Self-Manage and Reduce Symptoms of: CKD       See new goal      COMPLETED: To get help with preparing meals in order to eat better       Care Coordination Interventions: Evaluation of current treatment plan related to Nutritional Imbalance and patient's adherence to plan as established by provider Determined patient continues to eat 2 good meals each day, he prefers to eat breakfast at [employer] and dinner at [employer] Discussed patient enjoys the socialization he gets while eating out at the above listed restaurants Reviewed patient's current weight/height/bmi, educated patient regarding "healthy weight" bmi and discussed patient is in the range and has been maintaining his weight recently  Encouraged patient to aim for eating a well balanced diet and to stay physically active as tolerated and hydrated with 48-64 oz of water daily Determined patient is getting adequate sleep and states he averages 9 hours of sleep each night Instructed patient to keep his PCP well informed of new symptoms or concerns and patient verbalizes understanding  Body mass index is 23.39 kg/m.     Wt Readings from Last 3 Encounters:  04/11/24 163 lb (73.9 kg)  02/03/24 162 lb (73.5 kg)  01/25/24 162 lb (73.5 kg)        VBCI RN Care Plan Penile Implant Dysfunction       Problems:  Knowledge Deficits related to plan of care for treatment of Penile Implant Dysfunction Literacy barriers  Goal: Over  the next 90 days the Patient will verbalize basic understanding of Penile Implant Dysfunction disease process and self health management plan as evidenced by patient will be able to verbalize the recommended plan of care for replacing his Penile Implant per his Urologist recommendations   Interventions:   Evaluation of current treatment plan related to  Penile Implant Dysfunction , self-management and patient's adherence to plan as established by provider Review of patient status, including review of consultant's reports, relevant laboratory and other test results, and medication reconciliation completed Discussed patient has started the pre work up for replacing his Penile Implant per [facility]  Discussed plans with patient for ongoing care management follow up and provided patient with direct contact information for care management team   Patient Self-Care Activities:  Attend all scheduled provider appointments Call pharmacy for medication refills 3-7 days in advance of running out of medications Call provider office for new concerns or questions  Take medications as prescribed   Work with the nurse care manager to address care coordination needs and will continue to work with the clinical team to address health care and disease management related needs  Plan:  Follow up with provider re: plan for replacing Penile Implant Telephone follow up appointment with care management team member scheduled for:  Wednesday, May 28, at 11:30 AM           [facility] RN Care Plan  related to CKD stage 3a       Problems:  Chronic Disease Management support and education needs related to CKD Stage 3a Literacy barriers  Goal: Over the next 90 days the Patient will continue to work with Medical illustrator and/or Social Worker to address care management and care coordination needs related to CKD Stage 3a as evidenced by adherence to care management team scheduled appointments      Interventions:     Chronic Kidney Disease Interventions: Assessed the Patient understanding of chronic kidney disease    Evaluation of current treatment plan related to chronic kidney disease self management and patient's adherence to plan as established by provider      Reviewed prescribed diet aim for drinking 48-64 oz of water daily unless otherwise directed  Assessed social determinant of health barriers    Provided education on kidney disease progression    Last practice recorded BP readings:  BP Readings from Last 3 Encounters:  04/13/24 110/80  04/11/24 110/80  01/25/24 126/70   Most recent eGFR/CrCl:  Lab Results  Component Value Date   EGFR 49 (L) 04/11/2024    No components found for: "CRCL"  Patient Self-Care Activities:  Attend all scheduled provider appointments Call pharmacy for medication refills 3-7 days in advance of running out of medications Call provider office for new concerns or questions  Take medications as prescribed   Increase daily water intake to 48-64 oz daily unless otherwise directed  Work with the nurse care manager to address care coordination needs and will continue to work with the clinical team to address health care and disease management related needs  Plan:  Follow up with PCP for new symptoms or concerns Telephone follow up appointment with care management team member scheduled for:  Wednesday, May 28 at 11:30 AM             Please call 1-800-273-TALK (toll free, 24 hour hotline) if you are experiencing a Mental Health or Behavioral Health Crisis or need someone to talk to.  The patient verbalized understanding of instructions, educational materials, and care plan provided today and DECLINED offer to receive copy of patient instructions, educational materials, and care plan.   Louanne Roussel RN BSN CCM [place]  [facility], [facility] Health Nurse Care Coordinator  Direct Dial: [phone] Website: [email or web address]

## 2024-04-14 NOTE — Patient Outreach (Signed)
 Complex Care Management   Visit Note  04/14/2024  Name:  Nathan Morse MRN: [id number] DOB: 1934-11-29  Situation: Referral received for Complex Care Management related to Chronic Kidney Disease and Penile Implant Dysfunction.  I obtained verbal consent from Patient.  Visit completed with patient on the phone.  Background:   Past Medical History:  Diagnosis Date   Cancer (HCC)    Muscle spasm 05/19/2019   Seborrheic keratosis, inflamed 10/16/2020   Skin Surgery    Urinary tract infection with hematuria 03/22/2019    Assessment: Patient Reported Symptoms:  Cognitive Cognitive Status: Alert and oriented to person, place, and time Cognitive/Intellectual Conditions Management [RPT]: None reported or documented in medical history or problem list   Health Maintenance Behaviors: Annual physical exam, Immunizations, Healthy diet, Sleep adequate Healing Pattern: Average Health Facilitated by: Healthy diet, Rest  Neurological Neurological Review of Symptoms: No symptoms reported    HEENT HEENT Symptoms Reported: No symptoms reported      Cardiovascular Cardiovascular Symptoms Reported: No symptoms reported Does patient have uncontrolled Hypertension?: No Cardiovascular Conditions:  (Aortic Atherosclerosis) Cardiovascular Management Strategies: Routine screening, Medication therapy Weight: 163 lb (73.9 kg) Cardiovascular Self-Management Outcome: 4 (good)  Respiratory Respiratory Symptoms Reported: No symptoms reported    Endocrine Patient reports the following symptoms related to hypoglycemia or hyperglycemia : No symptoms reported Is patient diabetic?: No Endocrine Conditions: Vitamin D  deficiency Endocrine Management Strategies: Routine screening Endocrine Self-Management Outcome: 4 (good)  Gastrointestinal Gastrointestinal Symptoms Reported: Constipation, Change in appetite Gastrointestinal Conditions: Constipation Gastrointestinal Management Strategies: Medication  therapy Gastrointestinal Self-Management Outcome: 4 (good) Nutrition Risk Screen (CP): No indicators present  Genitourinary Genitourinary Symptoms Reported: Other, Urgency, Frequency Other Genitourinary Symptoms: erectile dysfunction Genitourinary Conditions: Frequency, Other, Chronic kidney disease Other Genitourinary Conditions: Penile Implant; Prostatectomy Genitourinary Management Strategies: Medication therapy, Fluid modification, Medical device Genitourinary Self-Management Outcome: 2 (bad)  Integumentary Integumentary Symptoms Reported: Skin changes Additional Integumentary Details: hands and legs get dry and "ashy" Skin Management Strategies: Routine screening (moisturizing) Skin Self-Management Outcome: 4 (good)  Musculoskeletal Musculoskelatal Symptoms Reviewed: Unsteady gait Musculoskeletal Conditions: Unsteady gait Musculoskeletal Management Strategies: Routine screening, Medical device Musculoskeletal Self-Management Outcome: 4 (good) Musculoskeletal Comment: uses a cane Falls in the past year?: No Number of falls in past year: 1 or less Was there an injury with Fall?: No Fall Risk Category Calculator: 0 Patient Fall Risk Level: Low Fall Risk Patient at Risk for Falls Due to: Impaired balance/gait Fall risk Follow up: Falls prevention discussed, Education provided, Falls evaluation completed  Psychosocial Psychosocial Symptoms Reported: No symptoms reported   Major Change/Loss/Stressor/Fears (CP): Denies Techniques to Cope with Loss/Stress/Change: None (support from friends) Quality of Family Relationships: supportive, involved, helpful Do you feel physically threatened by others?: No      04/13/2024    1:21 PM  Depression screen PHQ 2/9  Decreased Interest 1  Down, Depressed, Hopeless 0  PHQ - 2 Score 1    Vitals:   04/13/24 1304  BP: 110/80  Pulse: 63    Medications Reviewed Today     Reviewed by Kaylene Pascal, RN (Registered Nurse) on 04/13/24 at 1307   Med List Status: <None>   Medication Order Taking? Sig Documenting Provider Last Dose Status Informant  atorvastatin  (LIPITOR) 10 MG tablet [id number] Yes TAKE 1 TABLET(10 MG) BY MOUTH DAILY [name], FNP Taking Active   cyanocobalamin  (VITAMIN B12) 1000 MCG tablet [id number] Yes Take 1 tablet (1,000 mcg total) by mouth daily. [name], FNP Taking Active   docusate sodium (  COLACE) 100 MG capsule [id number] Yes Take 100 mg by mouth 2 (two) times daily. [provider] Taking Active   linaclotide  (LINZESS ) 145 MCG CAPS capsule [id number] Yes Take 1 capsule (145 mcg total) by mouth daily before breakfast. [name], FNP Taking Active   mirtazapine  (REMERON ) 7.5 MG tablet [id number] Yes TAKE 1 TABLET(7.5 MG) BY MOUTH AT BEDTIME [name], FNP Taking Active   oxybutynin  (DITROPAN  XL) 10 MG 24 hr tablet [id number] Yes Take 1 tablet (10 mg total) by mouth at bedtime. [name], FNP Taking Active             Recommendation:   PCP Follow-up  Follow Up Plan:   Telephone follow up appointment date/time:  Wednesday, May 28 at 11:30 AM  Louanne Roussel RN BSN CCM [place]  [facility], [facility] Health Nurse Care Coordinator  Direct Dial: [phone] Website: [email or web address]

## 2024-05-11 ENCOUNTER — Other Ambulatory Visit: Payer: Self-pay

## 2024-05-11 NOTE — Patient Instructions (Signed)
 Visit Information  Thank you for taking time to visit with me today. Please don't hesitate to contact me if I can be of assistance to you before our next scheduled appointment.  Your next care management appointment is by telephone on Thursday, June 12 at 12:15 PM  Please call the care guide team at [phone] if you need to cancel, schedule, or reschedule an appointment.   Please call 1-800-273-TALK (toll free, 24 hour hotline) if you are experiencing a Mental Health or Behavioral Health Crisis or need someone to talk to.  Louanne Roussel RN BSN CCM [place]  [facility], [facility] Nurse Care Coordinator  Direct Dial: [phone] Website: [email or web address]

## 2024-05-11 NOTE — Patient Outreach (Signed)
 Complex Care Management   Visit Note  05/11/2024  Name:  Nathan Morse MRN: [id number] DOB: 10-11-1934  Situation: Referral received for Complex Care Management related to Chronic Kidney Disease, Aortic Atherosclerosis; Erectile Dysfunction with Penile Implant. I obtained verbal consent from Patient.  Visit completed with patient on the phone  Background:   Past Medical History:  Diagnosis Date   Cancer [facility])    Muscle spasm 05/19/2019   Seborrheic keratosis, inflamed 10/16/2020   Skin Surgery    Urinary tract infection with hematuria 03/22/2019    Assessment: Patient Reported Symptoms:  Cognitive Cognitive Status: Alert and oriented to person, place, and time Cognitive/Intellectual Conditions Management [RPT]: None reported or documented in medical history or problem list   Health Maintenance Behaviors: Annual physical exam, Healthy diet, Hobbies, Sleep adequate Health Facilitated by: Healthy diet, Rest  Neurological Neurological Review of Symptoms: Not assessed    HEENT HEENT Symptoms Reported: Not assessed      Cardiovascular Cardiovascular Symptoms Reported: Not assessed    Respiratory Respiratory Symptoms Reported: Not assesed    Endocrine Patient reports the following symptoms related to hypoglycemia or hyperglycemia : Not assessed    Gastrointestinal Gastrointestinal Symptoms Reported: Not assessed      Genitourinary Genitourinary Symptoms Reported: Other Other Genitourinary Symptoms: Erectile Dysfunction due to arterial insufficiency; history of Prostate CA in 2022 Genitourinary Conditions: Chronic kidney disease Genitourinary Management Strategies: Medication therapy (routine screening) Genitourinary Self-Management Outcome: 4 (good) Genitourinary Comment: tentative penile implant replacment scheduled with [facility] on 05/15/24  Integumentary Integumentary Symptoms Reported: Not assessed    Musculoskeletal Musculoskelatal Symptoms Reviewed: Not assessed         Psychosocial Psychosocial Symptoms Reported: Not assessed            04/13/2024    1:21 PM  Depression screen PHQ 2/9  Decreased Interest 1  Down, Depressed, Hopeless 0  PHQ - 2 Score 1    There were no vitals filed for this visit.  Medications Reviewed Today     Reviewed by Kaylene Pascal, RN (Registered Nurse) on 05/11/24 at 1356  Med List Status: <None>   Medication Order Taking? Sig Documenting Provider Last Dose Status Informant  atorvastatin  (LIPITOR) 10 MG tablet [id number] No TAKE 1 TABLET(10 MG) BY MOUTH DAILY [name], FNP Taking Active   cyanocobalamin  (VITAMIN B12) 1000 MCG tablet [id number] No Take 1 tablet (1,000 mcg total) by mouth daily. [name], FNP Taking Active   docusate sodium (COLACE) 100 MG capsule [id number] No Take 100 mg by mouth 2 (two) times daily. [provider] Taking Active   linaclotide  (LINZESS ) 145 MCG CAPS capsule [id number] No Take 1 capsule (145 mcg total) by mouth daily before breakfast. [name], FNP Taking Active   mirtazapine  (REMERON ) 7.5 MG tablet [id number] No TAKE 1 TABLET(7.5 MG) BY MOUTH AT BEDTIME [name], FNP Taking Active   oxybutynin  (DITROPAN  XL) 10 MG 24 hr tablet [id number] No Take 1 tablet (10 mg total) by mouth at bedtime. [name], FNP Taking Active             Recommendation:   Specialty provider follow-up [facility] for management of ED w/penile implant replacement as directed  Follow Up Plan:   Telephone follow up appointment date/time:  Thursday, June 12 at 12:15 PM  Louanne Roussel RN BSN CCM [place]  [facility], [facility] Health Nurse Care Coordinator  Direct Dial: [phone] Website: [email or web address]

## 2024-05-13 ENCOUNTER — Other Ambulatory Visit (HOSPITAL_COMMUNITY): Payer: Self-pay | Admitting: Urology

## 2024-05-13 DIAGNOSIS — N5201 Erectile dysfunction due to arterial insufficiency: Secondary | ICD-10-CM

## 2024-05-26 ENCOUNTER — Telehealth: Payer: Self-pay

## 2024-05-30 ENCOUNTER — Other Ambulatory Visit: Payer: Self-pay | Admitting: Nurse Practitioner

## 2024-05-30 DIAGNOSIS — K5904 Chronic idiopathic constipation: Secondary | ICD-10-CM

## 2024-06-01 ENCOUNTER — Ambulatory Visit (HOSPITAL_COMMUNITY)
Admission: RE | Admit: 2024-06-01 | Discharge: 2024-06-01 | Disposition: A | Discharge status: Home / Self Care | Source: Ambulatory Visit | Attending: Urology | Admitting: Urology

## 2024-06-01 DIAGNOSIS — R160 Hepatomegaly, not elsewhere classified: Secondary | ICD-10-CM | POA: Diagnosis not present

## 2024-06-01 DIAGNOSIS — N281 Cyst of kidney, acquired: Secondary | ICD-10-CM | POA: Insufficient documentation

## 2024-06-01 DIAGNOSIS — Z1809 Other retained radioactive fragments: Secondary | ICD-10-CM | POA: Diagnosis present

## 2024-06-01 DIAGNOSIS — R5383 Other fatigue: Secondary | ICD-10-CM | POA: Diagnosis present

## 2024-06-01 DIAGNOSIS — Z960 Presence of urogenital implants: Secondary | ICD-10-CM | POA: Diagnosis not present

## 2024-06-01 DIAGNOSIS — N5201 Erectile dysfunction due to arterial insufficiency: Secondary | ICD-10-CM | POA: Diagnosis present

## 2024-06-01 LAB — POCT I-STAT CREATININE: Creatinine, Ser: 1.3 mg/dL — ABNORMAL HIGH (ref 0.61–1.24)

## 2024-06-01 MED ORDER — IOHEXOL 9 MG/ML PO SOLN
1000.0000 mL | ORAL | Status: AC
  Administered 2024-06-01: 1000 mL via ORAL

## 2024-06-01 MED ORDER — IOHEXOL 300 MG/ML  SOLN
100.0000 mL | Freq: Once | INTRAMUSCULAR | Status: AC | PRN
  Administered 2024-06-01: 100 mL via INTRAVENOUS

## 2024-06-01 MED ORDER — IOHEXOL 9 MG/ML PO SOLN
ORAL | Status: AC
  Filled 2024-06-01: qty 1000

## 2024-06-01 MED ORDER — SODIUM CHLORIDE (PF) 0.9 % IJ SOLN
INTRAMUSCULAR | Status: AC
  Filled 2024-06-01: qty 50

## 2024-06-08 ENCOUNTER — Encounter: Payer: Self-pay | Admitting: Podiatry

## 2024-06-08 ENCOUNTER — Ambulatory Visit: Admitting: Podiatry

## 2024-06-08 DIAGNOSIS — M79675 Pain in left toe(s): Secondary | ICD-10-CM

## 2024-06-08 DIAGNOSIS — M79674 Pain in right toe(s): Secondary | ICD-10-CM | POA: Diagnosis not present

## 2024-06-08 DIAGNOSIS — L84 Corns and callosities: Secondary | ICD-10-CM

## 2024-06-08 DIAGNOSIS — B351 Tinea unguium: Secondary | ICD-10-CM

## 2024-06-08 NOTE — Progress Notes (Signed)
  Subjective:  Patient ID: Nathan Morse, male    DOB: Jul 26, 1934,  MRN: [id number]  Nathan Morse presents to clinic today for: at risk foot care with h/o CKD and corn(s) of both feet, callus(es) of both feet and painful elongated, discolored, dystrophic nails.  Pain interferes with ambulation. Aggravating factors include wearing enclosed shoe gear. Painful toenails interfere with ambulation. Aggravating factors include wearing enclosed shoe gear. Pain is relieved with periodic professional debridement. Painful corns and calluses are aggravated when weightbearing with and without shoegear. Pain is relieved with periodic professional debridement.  Chief Complaint  Patient presents with   RFC    RM#15 RFC    PCP is Georgina Speaks, FNP.  No Known Allergies  Review of Systems: Negative except as noted in the HPI.  Objective: No changes noted in today's physical examination. There were no vitals filed for this visit.  Nathan Morse is a pleasant 89 y.o. male WD, WN in NAD. AAO x 3.  Vascular Examination: Capillary refill time <3 seconds b/l LE. Palpable pedal pulses b/l LE. Digital hair sparse b/l. No pedal edema b/l. Skin temperature gradient WNL b/l. No varicosities b/l. SABRA  Dermatological Examination: Pedal skin with normal turgor, texture and tone b/l. No open wounds. No interdigital macerations b/l. Toenails 1-5 b/l thickened, discolored, dystrophic with subungual debris. There is pain on palpation to dorsal aspect of nailplates.   Hyperkeratotic lesion(s) left 5th toe dorsal PIPJ and submet head 5 left foot.  No erythema, no edema, no drainage, no fluctuance..  Neurological Examination: Protective sensation intact with 10 gram monofilament b/l LE. Vibratory sensation intact b/l LE.   Musculoskeletal Examination: Muscle strength 5/5 to all lower extremity muscle groups bilaterally. Hammertoe(s) noted to the L 5th toe and R 5th toe. Patient ambulates independent of any assistive  aids.  Assessment/Plan: 1. Pain due to onychomycosis of toenails of both feet   2. Corns and callosities   3. Pain in toes of both feet     Consent given for treatment. Patient examined. All patient's and/or POA's questions/concerns addressed on today's visit. Mycotic toenails 1-5 debrided in length and girth without incident. Corn(s)/callus(es) dorsal PIPJ of L 5th toe and submet head 5 left foot pared with sharp debridement without incident.Continue soft, supportive shoe gear daily. Report any pedal injuries to medical professional. Call office if there are any quesitons/concerns.  Return in about 3 months (around 09/08/2024).  Delon LITTIE Merlin, DPM      [place] LOCATION: [address], [place] [address]                   Office [phone]   [facility] LOCATION: [address] [place], [place] [address] Office [phone]

## 2024-06-28 ENCOUNTER — Telehealth: Payer: Self-pay

## 2024-06-28 ENCOUNTER — Other Ambulatory Visit: Payer: Self-pay

## 2024-06-28 DIAGNOSIS — R634 Abnormal weight loss: Secondary | ICD-10-CM

## 2024-06-28 MED ORDER — MIRTAZAPINE 7.5 MG PO TABS
ORAL_TABLET | ORAL | 1 refills | Status: DC
Start: 2024-06-28 — End: 2024-08-08

## 2024-06-28 NOTE — Telephone Encounter (Signed)
 Copied from CRM [id number]. Topic: Clinical - Order For Equipment >> Jun 28, 2024  4:02 PM Fonda T wrote: Reason for CRM: [place] with  [employer] calling to check status of a faxed request for blood pressure monitor for patient. Per Lynwood faxed request on 06/27/24 around 3:00 pm. Per Lynwood will refax form at time of call.   Requesting a signed physician order for blood pressure monitor, and follow up call with status of form completion. Or return signed request via fax on form.   Can be reached at phone [phone] with any additional questions.  LVM for [place] patient needs appt to get arm measured. LVM for patient to call back and schedule NV for Friday or next Tuesday.

## 2024-06-28 NOTE — Telephone Encounter (Signed)
 Devere pouch called to get an appt made for pt to get arm measured for bp cuff.

## 2024-07-04 ENCOUNTER — Other Ambulatory Visit: Payer: Self-pay

## 2024-07-04 DIAGNOSIS — I700 Atherosclerosis of aorta: Secondary | ICD-10-CM

## 2024-07-04 DIAGNOSIS — N1832 Chronic kidney disease, stage 3b: Secondary | ICD-10-CM

## 2024-07-04 MED ORDER — ATORVASTATIN CALCIUM 10 MG PO TABS
ORAL_TABLET | ORAL | 1 refills | Status: DC
Start: 2024-07-04 — End: 2024-10-20

## 2024-07-04 NOTE — Patient Outreach (Signed)
 Complex Care Management   Visit Note  07/04/2024  Name:  Nathan Morse MRN: [id number] DOB: 12-23-1933  Situation: Referral received for Complex Care Management related to Chronic Kidney Disease, Aortic Atherosclerosis; Erectile Dysfunction with Penile Implant. I obtained verbal consent from Patient.  Visit completed with patient on the phone.  Background:   Past Medical History:  Diagnosis Date   Cancer (HCC)    Muscle spasm 05/19/2019   Seborrheic keratosis, inflamed 10/16/2020   Skin Surgery    Urinary tract infection with hematuria 03/22/2019    Assessment: Patient Reported Symptoms:  Cognitive Cognitive Status: Alert and oriented to person, place, and time Cognitive/Intellectual Conditions Management [RPT]: None reported or documented in medical history or problem list   Health Maintenance Behaviors: Annual physical exam  Neurological Neurological Review of Symptoms: Not assessed    HEENT HEENT Symptoms Reported: Not assessed      Cardiovascular Cardiovascular Symptoms Reported: Not assessed    Respiratory Respiratory Symptoms Reported: Not assesed    Endocrine Endocrine Symptoms Reported: Not assessed    Gastrointestinal Gastrointestinal Symptoms Reported: No symptoms reported      Genitourinary Genitourinary Symptoms Reported: Other Other Genitourinary Symptoms: patient completed a pelvic CT for evaluation of replacing his penile implant Genitourinary Self-Management Outcome: 3 (uncertain) Genitourinary Comment: Penile implant  Integumentary Integumentary Symptoms Reported: Not assessed    Musculoskeletal Musculoskelatal Symptoms Reviewed: Not assessed        Psychosocial Psychosocial Symptoms Reported: Not assessed     Quality of Family Relationships: helpful, involved, supportive Do you feel physically threatened by others?: No      04/13/2024    1:21 PM  Depression screen PHQ 2/9  Decreased Interest 1  Down, Depressed, Hopeless 0  PHQ - 2 Score 1     There were no vitals filed for this visit.  Medications Reviewed Today     Reviewed by Morgan Clayborne CROME, RN (Registered Nurse) on 07/04/24 at 1309  Med List Status: <None>   Medication Order Taking? Sig Documenting Provider Last Dose Status Informant  atorvastatin  (LIPITOR) 10 MG tablet [id number] No TAKE 1 TABLET(10 MG) BY MOUTH DAILY Georgina Speaks, FNP Taking Active   cyanocobalamin  (VITAMIN B12) 1000 MCG tablet [id number] No Take 1 tablet (1,000 mcg total) by mouth daily. Georgina Speaks, FNP Taking Active   docusate sodium (COLACE) 100 MG capsule [id number] No Take 100 mg by mouth 2 (two) times daily. [provider] Taking Active   LINZESS  145 MCG CAPS capsule [id number]  TAKE 1 CAPSULE(145 MCG) BY MOUTH DAILY BEFORE OFILIA Georgina Speaks, FNP  Active   mirtazapine  (REMERON ) 7.5 MG tablet [id number]  TAKE 1 TABLET(7.5 MG) BY MOUTH AT BEDTIME Georgina Speaks, FNP  Active   oxybutynin  (DITROPAN  XL) 10 MG 24 hr tablet [id number] No Take 1 tablet (10 mg total) by mouth at bedtime. Georgina Speaks, FNP Taking Active             Recommendation:   Specialty provider follow-up with [facility] as directed   Follow Up Plan:   Telephone follow up appointment date/time:  Tuesday, August 19 at 12:00 PM  Clayborne Morgan RN BSN CCM [place]  [facility], [facility] Health Nurse Care Coordinator  Direct Dial: [phone] Website: [name]

## 2024-07-04 NOTE — Patient Instructions (Signed)
 Visit Information  Thank you for taking time to visit with me today. Please don't hesitate to contact me if I can be of assistance to you before our next scheduled appointment.  Your next care management appointment is by telephone on Tuesday, August 19 at 12:00 PM  Please call the care guide team at [phone] if you need to cancel, schedule, or reschedule an appointment.   Please call 1-800-273-TALK (toll free, 24 hour hotline) if you are experiencing a Mental Health or Behavioral Health Crisis or need someone to talk to.  Clayborne Ly RN BSN CCM [place]  [facility], [facility] Health Nurse Care Coordinator  Direct Dial: [phone] Website: [email or web address]

## 2024-07-05 ENCOUNTER — Other Ambulatory Visit: Payer: Self-pay

## 2024-07-05 DIAGNOSIS — R3981 Functional urinary incontinence: Secondary | ICD-10-CM

## 2024-07-05 MED ORDER — OXYBUTYNIN CHLORIDE ER 10 MG PO TB24
10.0000 mg | ORAL_TABLET | Freq: Every day | ORAL | 1 refills | Status: DC
Start: 2024-07-05 — End: 2024-10-20

## 2024-07-06 ENCOUNTER — Telehealth: Payer: Self-pay | Admitting: *Deleted

## 2024-07-06 NOTE — Progress Notes (Signed)
 Complex Care Management Note Care Guide Note  07/06/2024 Name: Nathan Morse MRN: [id number] DOB: 14-Apr-1934   Complex Care Management Outreach Attempts: An unsuccessful telephone outreach was attempted today to offer the patient information about available complex care management services.  Follow Up Plan:  Additional outreach attempts will be made to offer the patient complex care management information and services.   Encounter Outcome:  No Answer  Asencion [facility]ee [facility] HealthPopulation Health Care Guide  Direct Dial:[phone] Fax:[phone] Website: [email or web address]

## 2024-07-07 ENCOUNTER — Telehealth: Payer: Self-pay | Admitting: *Deleted

## 2024-07-07 NOTE — Progress Notes (Signed)
 Complex Care Management Note Care Guide Note  07/07/2024 Name: Nathan Morse MRN: [id number] DOB: [birth year]   Complex Care Management Outreach Attempts: An unsuccessful telephone outreach was attempted today to offer the patient information about available complex care management services.  Follow Up Plan:  Additional outreach attempts will be made to offer the patient complex care management information and services.   Encounter Outcome:  No Answer  Asencion [facility]ee [facility] HealthPopulation Health Care Guide  Direct Dial:[phone] Fax:[phone] Website: [email or web address]

## 2024-07-08 ENCOUNTER — Telehealth: Payer: Self-pay | Admitting: *Deleted

## 2024-07-08 NOTE — Progress Notes (Signed)
 Complex Care Management Note Care Guide Note  07/08/2024 Name: Nathan Morse MRN: [id number] DOB: [birth year]  Nathan Morse is a 89 y.o. year old male who is a primary care patient of Georgina Speaks, FNP . The community resource team was consulted for assistance with Transportation Needs   SDOH screenings and interventions completed:  Yes  Social Drivers of Health From This Encounter   Transportation Needs: Unknown (07/08/2024)   PRAPARE - Administrator, Civil Service (Medical): No    SDOH Interventions Today    Flowsheet Row Most Recent Value  SDOH Interventions   Transportation Interventions SCAT (Specialized Community Area Transporation)  [Patient did not answer but mailed him the access [place] application as that would be his community option]     Care guide performed the following interventions: Patient provided with information about care guide support team and interviewed to confirm resource needs.  Follow Up Plan:  No further follow up planned at this time. The patient has been provided with needed resources.  Encounter Outcome: Voice mail is full mailed information  Asencion Gell  [facility] HealthPopulation Health Care Guide  Direct Dial:[phone] Fax:[phone] Website: [email or web address]

## 2024-07-12 ENCOUNTER — Ambulatory Visit: Payer: Self-pay

## 2024-07-13 ENCOUNTER — Telehealth: Payer: Self-pay

## 2024-07-13 NOTE — Telephone Encounter (Signed)
 Copied from CRM [id number]. Topic: General - Other >> Jul 11, 2024 12:15 PM Myrick T wrote: Reason for CRM: Salomon [phone] from [facility] called stated they need the most recent visit clinical notes. Please fax to [phone] >> Jul 13, 2024 10:36 AM Antwanette L wrote: Salomon from [employer] is calling because they need the most recent clinical notes. Please fax them to [phone]  Franceen Paris, MA

## 2024-08-02 ENCOUNTER — Telehealth: Payer: Self-pay

## 2024-08-03 ENCOUNTER — Other Ambulatory Visit: Payer: Self-pay

## 2024-08-03 NOTE — Patient Instructions (Signed)
 Visit Information  Thank you for taking time to visit with me today. Please don't hesitate to contact me if I can be of assistance to you before our next scheduled appointment.  Your next care management appointment is by telephone on Wednesday, September 17 at 09:30 AM  Please call the care guide team at [phone] if you need to cancel, schedule, or reschedule an appointment.   Please call 1-800-273-TALK (toll free, 24 hour hotline) if you are experiencing a Mental Health or Behavioral Health Crisis or need someone to talk to.  Clayborne Ly RN BSN CCM [place]  [facility], [facility] Health Nurse Care Coordinator  Direct Dial: [phone] Website: [email or web address]

## 2024-08-03 NOTE — Patient Outreach (Signed)
 Complex Care Management   Visit Note  08/03/2024  Name:  Nathan Morse MRN: [id number] DOB: [birth year]  Situation: Referral received for Complex Care Management related to Chronic Kidney Disease, Aortic Atherosclerosis; Erectile Dysfunction with Penile Implant, fatigue, loss of appetite. I obtained verbal consent from Patient.  Visit completed with Patient on the phone.  Background:   Past Medical History:  Diagnosis Date   Cancer (HCC)    Muscle spasm 05/19/2019   Seborrheic keratosis, inflamed 10/16/2020   Skin Surgery    Urinary tract infection with hematuria 03/22/2019    Assessment: Patient Reported Symptoms:  Cognitive Cognitive Status: No symptoms reported (spoke with friend Devere Fallow with patient present) Cognitive/Intellectual Conditions Management [RPT]: None reported or documented in medical history or problem list   Health Maintenance Behaviors: Annual physical exam Health Facilitated by: Rest  Neurological Neurological Review of Symptoms: No symptoms reported    HEENT HEENT Symptoms Reported: No symptoms reported      Cardiovascular Cardiovascular Symptoms Reported: Fatigue Does patient have uncontrolled Hypertension?: No Cardiovascular Management Strategies: Adequate rest, Routine screening  Respiratory Respiratory Symptoms Reported: No symptoms reported    Endocrine Endocrine Symptoms Reported: No symptoms reported Is patient diabetic?: No    Gastrointestinal Gastrointestinal Symptoms Reported: Change in appetite Gastrointestinal Management Strategies: Medication therapy Gastrointestinal Self-Management Outcome: 3 (uncertain)    Genitourinary Genitourinary Symptoms Reported: No symptoms reported    Integumentary Integumentary Symptoms Reported: No symptoms reported    Musculoskeletal Musculoskelatal Symptoms Reviewed: Weakness Musculoskeletal Management Strategies: Adequate rest, Routine screening Musculoskeletal Self-Management Outcome: 3  (uncertain)      Psychosocial Psychosocial Symptoms Reported: No symptoms reported     Quality of Family Relationships: involved, helpful, supportive Do you feel physically threatened by others?: No    08/03/2024    PHQ2-9 Depression Screening   Burnetta Kohls interest or pleasure in doing things    Feeling down, depressed, or hopeless    PHQ-2 - Total Score    Trouble falling or staying asleep, or sleeping too much    Feeling tired or having Deanne Bedgood energy    Poor appetite or overeating     Feeling bad about yourself - or that you are a failure or have let yourself or your family down    Trouble concentrating on things, such as reading the newspaper or watching television    Moving or speaking so slowly that other people could have noticed.  Or the opposite - being so fidgety or restless that you have been moving around a lot more than usual    Thoughts that you would be better off dead, or hurting yourself in some way    PHQ2-9 Total Score    If you checked off any problems, how difficult have these problems made it for you to do your work, take care of things at home, or get along with other people    Depression Interventions/Treatment      There were no vitals filed for this visit.  Medications Reviewed Today     Reviewed by Morgan Clayborne CROME, RN (Registered Nurse) on 08/03/24 at 1128  Med List Status: <None>   Medication Order Taking? Sig Documenting Provider Last Dose Status Informant  atorvastatin  (LIPITOR) 10 MG tablet [id number]  TAKE 1 TABLET(10 MG) BY MOUTH DAILY Georgina Speaks, FNP  Active   cyanocobalamin  (VITAMIN B12) 1000 MCG tablet [id number]  Take 1 tablet (1,000 mcg total) by mouth daily.  Patient not taking: Reported on 08/03/2024   Georgina Speaks, FNP  Active  docusate sodium (COLACE) 100 MG capsule [id number]  Take 100 mg by mouth 2 (two) times daily. [provider]  Active   LINZESS  145 MCG CAPS capsule [id number]  TAKE 1 CAPSULE(145 MCG) BY MOUTH DAILY BEFORE  OFILIA Georgina Speaks, FNP  Active   mirtazapine  (REMERON ) 7.5 MG tablet [id number]  TAKE 1 TABLET(7.5 MG) BY MOUTH AT BEDTIME Georgina Speaks, FNP  Active   oxybutynin  (DITROPAN  XL) 10 MG 24 hr tablet [id number]  Take 1 tablet (10 mg total) by mouth at bedtime. Georgina Speaks, FNP  Active             Recommendation:   PCP Follow-up with Speaks Georgina FNP scheduled for 09/22/24 at 11:40 AM Specialty provider follow-up with [facility] as directed   Follow Up Plan:   Telephone follow up appointment date/time:  Wednesday, September 17 at 09:30 AM  Clayborne Ly RN BSN CCM [place]  [facility], [facility] Health Nurse Care Coordinator  Direct Dial: [phone] Website: [email or web address]

## 2024-08-08 ENCOUNTER — Other Ambulatory Visit: Payer: Self-pay | Admitting: Nurse Practitioner

## 2024-08-08 DIAGNOSIS — R634 Abnormal weight loss: Secondary | ICD-10-CM

## 2024-08-08 DIAGNOSIS — E538 Deficiency of other specified B group vitamins: Secondary | ICD-10-CM

## 2024-08-08 MED ORDER — MIRTAZAPINE 15 MG PO TABS
ORAL_TABLET | ORAL | 1 refills | Status: DC
Start: 2024-08-08 — End: 2024-10-20

## 2024-08-08 MED ORDER — VITAMIN B-12 1000 MCG PO TABS
1000.0000 ug | ORAL_TABLET | Freq: Every day | ORAL | 1 refills | Status: DC
Start: 2024-08-08 — End: 2024-10-20

## 2024-08-23 ENCOUNTER — Telehealth: Payer: Self-pay

## 2024-08-23 NOTE — Telephone Encounter (Signed)
 Copied from CRM [id number]. Topic: Clinical - Order For Equipment >> Aug 22, 2024  4:40 PM Amy B wrote: Reason for CRM: Prescription request for orthotics.  Please call [phone].  [facility]   LVM WITH PATIENT TO VERIFY IF HE WOULD LIKE HE A BACK BRACE.

## 2024-08-25 ENCOUNTER — Telehealth: Payer: Self-pay

## 2024-08-25 NOTE — Telephone Encounter (Signed)
 Copied from CRM [id number]. Topic: Clinical - Prescription Issue >> Aug 25, 2024 11:11 AM Gustabo D wrote: [facility] [employer] for needs to be signed and faxed back to [phone] Aloysius is calling from [facility] -  [phone] Hasn't gotten back  LVM FOR PT X2 TO VERIFY ORDER WAS REQUESTED.

## 2024-08-31 ENCOUNTER — Telehealth: Payer: Self-pay

## 2024-09-14 ENCOUNTER — Telehealth: Payer: Self-pay

## 2024-09-21 ENCOUNTER — Encounter: Payer: Self-pay | Admitting: Podiatry

## 2024-09-21 ENCOUNTER — Ambulatory Visit: Admitting: Podiatry

## 2024-09-21 DIAGNOSIS — B351 Tinea unguium: Secondary | ICD-10-CM

## 2024-09-21 DIAGNOSIS — M79674 Pain in right toe(s): Secondary | ICD-10-CM | POA: Diagnosis not present

## 2024-09-21 DIAGNOSIS — M79675 Pain in left toe(s): Secondary | ICD-10-CM

## 2024-09-22 ENCOUNTER — Ambulatory Visit: Payer: Self-pay | Admitting: Nurse Practitioner

## 2024-09-22 NOTE — Progress Notes (Deleted)
 LILLETTE Kristeen JINNY Gladis, CMA,acting as a Neurosurgeon for Gaines Ada, FNP.,have documented all relevant documentation on the behalf of Gaines Ada, FNP,as directed by  Gaines Ada, FNP while in the presence of Gaines Ada, FNP.  Subjective:  Patient ID: Nathan Morse , male    DOB: 08-20-1934 , 89 y.o.   MRN: [id number]  No chief complaint on file.   HPI  HPI   Past Medical History:  Diagnosis Date   Cancer (HCC)    Muscle spasm 05/19/2019   Seborrheic keratosis, inflamed 10/16/2020   Skin Surgery    Urinary tract infection with hematuria 03/22/2019     Family History  Adopted: Yes  Family history unknown: Yes     Current Outpatient Medications:    atorvastatin  (LIPITOR) 10 MG tablet, TAKE 1 TABLET(10 MG) BY MOUTH DAILY, Disp: 90 tablet, Rfl: 1   cyanocobalamin  (VITAMIN B12) 1000 MCG tablet, Take 1 tablet (1,000 mcg total) by mouth daily., Disp: 90 tablet, Rfl: 1   docusate sodium (COLACE) 100 MG capsule, Take 100 mg by mouth 2 (two) times daily., Disp: , Rfl:    LINZESS  145 MCG CAPS capsule, TAKE 1 CAPSULE(145 MCG) BY MOUTH DAILY BEFORE BREAKFAST, Disp: 90 capsule, Rfl: 1   mirtazapine  (REMERON ) 15 MG tablet, TAKE 1 TABLET(7.5 MG) BY MOUTH AT BEDTIME, Disp: 90 tablet, Rfl: 1   oxybutynin  (DITROPAN  XL) 10 MG 24 hr tablet, Take 1 tablet (10 mg total) by mouth at bedtime., Disp: 90 tablet, Rfl: 1   No Known Allergies   Review of Systems   There were no vitals filed for this visit. There is no height or weight on file to calculate BMI.  Wt Readings from Last 3 Encounters:  04/13/24 163 lb (73.9 kg)  04/11/24 163 lb (73.9 kg)  02/03/24 162 lb (73.5 kg)    The ASCVD Risk score (Arnett DK, et al., 2019) failed to calculate for the following reasons:   The 2019 ASCVD risk score is only valid for ages 76 to 68  Objective:  Physical Exam      Assessment And Plan:  B12 deficiency  Vitamin D  deficiency    No follow-ups on file.  Patient was given opportunity to ask questions.  Patient verbalized understanding of the plan and was able to repeat key elements of the plan. All questions were answered to their satisfaction.    LILLETTE Gaines Ada, FNP, have reviewed all documentation for this visit. The documentation on 09/22/24 for the exam, diagnosis, procedures, and orders are all accurate and complete.   IF YOU HAVE BEEN REFERRED TO A SPECIALIST, IT MAY TAKE 1-2 WEEKS TO SCHEDULE/PROCESS THE REFERRAL. IF YOU HAVE NOT HEARD FROM US /SPECIALIST IN TWO WEEKS, PLEASE GIVE US  A CALL AT [phone] X [phone].

## 2024-09-26 ENCOUNTER — Telehealth: Payer: Self-pay

## 2024-09-26 NOTE — Patient Instructions (Signed)
 Nathan Morse - I am sorry I was unable to reach you today for our scheduled appointment. I work with Georgina Speaks, FNP and am calling to support your healthcare needs. Please contact me at [phone] at your earliest convenience. I look forward to speaking with you soon.   Thank you,   Clayborne Ly RN BSN CCM [place]  [facility], [facility] Health Nurse Care Coordinator  Direct Dial: [phone] Website: [email or web address]

## 2024-09-26 NOTE — Progress Notes (Signed)
  Subjective:  Patient ID: Nathan Morse, male    DOB: January 17, 1934,  MRN: [id number]  Nathan Morse presents to clinic today for at risk foot care with h/o CKD and painful mycotic toenails of both feet that are difficult to trim. Pain interferes with daily activities and wearing enclosed shoe gear comfortably.  Chief Complaint  Patient presents with   RFC    RFC NON diabetic toenail trim. LOV with PCP 04/11/24.    New problem(s): None.   PCP is Georgina Speaks, FNP.  No Known Allergies  Review of Systems: Negative except as noted in the HPI.  Objective: No changes noted in today's physical examination. There were no vitals filed for this visit. Nathan Morse is a pleasant 89 y.o. male WD, WN in NAD. AAO x 3.  Neurovascular status intact bilaterally and symmetrically.  Dermatological Examination: Pedal skin with normal turgor, texture and tone b/l. No open wounds. No interdigital macerations b/l. Toenails 1-5 b/l thickened, discolored, dystrophic with subungual debris. There is pain on palpation to dorsal aspect of nailplates.   Hyperkeratotic lesion(s) left 5th toe dorsal PIPJ and submet head 5 left foot.  No erythema, no edema, no drainage, no fluctuance..  Musculoskeletal Examination: Muscle strength 5/5 to all lower extremity muscle groups bilaterally. Hammertoe(s) noted to the L 5th toe and R 5th toe. Patient ambulates independent of any assistive aids.  Assessment/Plan: 1. Pain due to onychomycosis of toenails of both feet   Patient was evaluated and treated. All patient's and/or POA's questions/concerns addressed on today's visit. Toenails 1-5 debrided in length and girth without incident. Treatment was provided by assistant Andrez Manchester under my supervision. Continue soft, supportive shoe gear daily. Report any pedal injuries to medical professional. Call office if there are any questions/concerns.  Return in about 3 months (around 12/22/2024).  Nathan Morse, DPM       [place] [facility] LOCATION: [address], [place] [address]                   Office [phone]   [facility] LOCATION: [address] [place], [place] [address] Office [phone]

## 2024-10-13 DIAGNOSIS — M19071 Primary osteoarthritis, right ankle and foot: Secondary | ICD-10-CM | POA: Diagnosis not present

## 2024-10-13 DIAGNOSIS — M25511 Pain in right shoulder: Secondary | ICD-10-CM | POA: Diagnosis not present

## 2024-10-13 DIAGNOSIS — M19072 Primary osteoarthritis, left ankle and foot: Secondary | ICD-10-CM | POA: Diagnosis not present

## 2024-10-20 ENCOUNTER — Ambulatory Visit (INDEPENDENT_AMBULATORY_CARE_PROVIDER_SITE_OTHER): Payer: Self-pay | Admitting: Nurse Practitioner

## 2024-10-20 ENCOUNTER — Encounter: Payer: Self-pay | Admitting: Nurse Practitioner

## 2024-10-20 VITALS — BP 120/70 | HR 84 | Temp 97.8°F | Ht 70.0 in | Wt 153.0 lb

## 2024-10-20 DIAGNOSIS — R6889 Other general symptoms and signs: Secondary | ICD-10-CM

## 2024-10-20 DIAGNOSIS — I700 Atherosclerosis of aorta: Secondary | ICD-10-CM

## 2024-10-20 DIAGNOSIS — N1831 Chronic kidney disease, stage 3a: Secondary | ICD-10-CM

## 2024-10-20 DIAGNOSIS — E538 Deficiency of other specified B group vitamins: Secondary | ICD-10-CM | POA: Diagnosis not present

## 2024-10-20 DIAGNOSIS — Z23 Encounter for immunization: Secondary | ICD-10-CM

## 2024-10-20 DIAGNOSIS — R634 Abnormal weight loss: Secondary | ICD-10-CM | POA: Diagnosis not present

## 2024-10-20 DIAGNOSIS — E559 Vitamin D deficiency, unspecified: Secondary | ICD-10-CM | POA: Diagnosis not present

## 2024-10-20 DIAGNOSIS — K5904 Chronic idiopathic constipation: Secondary | ICD-10-CM | POA: Diagnosis not present

## 2024-10-20 DIAGNOSIS — R3981 Functional urinary incontinence: Secondary | ICD-10-CM

## 2024-10-20 MED ORDER — ATORVASTATIN CALCIUM 10 MG PO TABS
ORAL_TABLET | ORAL | 1 refills | Status: AC
Start: 2024-10-20 — End: ?

## 2024-10-20 MED ORDER — OXYBUTYNIN CHLORIDE ER 10 MG PO TB24
10.0000 mg | ORAL_TABLET | Freq: Every day | ORAL | 1 refills | Status: AC
Start: 2024-10-20 — End: ?

## 2024-10-20 MED ORDER — VITAMIN B-12 1000 MCG PO TABS: 1000.0000 ug | ORAL_TABLET | Freq: Every day | ORAL | 1 refills | Status: AC

## 2024-10-20 MED ORDER — LINACLOTIDE 145 MCG PO CAPS: 145.0000 ug | ORAL_CAPSULE | Freq: Every day | ORAL | 1 refills | Status: AC

## 2024-10-20 MED ORDER — MIRTAZAPINE 15 MG PO TABS
ORAL_TABLET | ORAL | 1 refills | Status: AC
Start: 2024-10-20 — End: ?

## 2024-10-20 MED ORDER — MIRTAZAPINE 15 MG PO TABS: ORAL_TABLET | ORAL | 1 refills | Status: DC

## 2024-10-20 NOTE — Assessment & Plan Note (Signed)
 Weight decreased from 163 to 153 pounds, likely due to inadequate dietary intake and possible malnutrition. - Encouraged increased intake of red meat and black beans to improve iron levels and overall nutrition. - Advised to eat more regularly and avoid relying on tobacco for oral activity.

## 2024-10-20 NOTE — Progress Notes (Signed)
 LILLETTE Kristeen JINNY Gladis, CMA,acting as a neurosurgeon for Gaines Ada, FNP.,have documented all relevant documentation on the behalf of Gaines Ada, FNP,as directed by  Gaines Ada, FNP while in the presence of Gaines Ada, FNP.  Subjective:  Patient ID: Nathan Morse , male    DOB: 03/31/1934 , 89 y.o.   MRN: [id number]  Chief Complaint  Patient presents with   B12 Injection    Patient presents today for a b12 and vitamin d  follow up, Patient reports compliance with medication. Patient denies any chest pain, SOB, or headaches. Patient reports he feels cold all the time.      HPI  Discussed the use of AI scribe software for clinical note transcription with the patient, who gave verbal consent to proceed.  History of Present Illness Nathan Morse is an 89 year old male who presents for follow-up on vitamin B levels and medication refills.  His vitamin B levels, previously low, improved but have started to decline again. He is not currently taking any vitamin B supplements, although a prescription was sent in August.  His vitamin D  levels were checked 11 months ago and are due for re-evaluation. He is not currently taking any vitamin D  supplements.  He has experienced a significant weight loss of 10 pounds since April, dropping from 163 pounds to 153 pounds. He and his significant other reports that he often chews tobacco, which occupies his mouth and reduces his food consumption. He frequents [place] twice a week and [place] every morning, but otherwise eats very little, often the same foods repeatedly.   He is currently taking Remeron  as part of his medication regimen. All medications that were on refill are being taken as prescribed.   Past Medical History:  Diagnosis Date   Cancer [facility])    Muscle spasm 05/19/2019   Seborrheic keratosis, inflamed 10/16/2020   Skin Surgery    Urinary tract infection with hematuria 03/22/2019     Family History  Adopted: Yes  Family history unknown:  Yes     Current Outpatient Medications:    docusate sodium (COLACE) 100 MG capsule, Take 100 mg by mouth 2 (two) times daily., Disp: , Rfl:    atorvastatin  (LIPITOR) 10 MG tablet, TAKE 1 TABLET(10 MG) BY MOUTH DAILY, Disp: 90 tablet, Rfl: 1   cyanocobalamin  (VITAMIN B12) 1000 MCG tablet, Take 1 tablet (1,000 mcg total) by mouth daily., Disp: 90 tablet, Rfl: 1   linaclotide  (LINZESS ) 145 MCG CAPS capsule, Take 1 capsule (145 mcg total) by mouth daily before breakfast., Disp: 90 capsule, Rfl: 1   mirtazapine  (REMERON ) 15 MG tablet, TAKE 1 TABLET(7.5 MG) BY MOUTH AT BEDTIME, Disp: 90 tablet, Rfl: 1   oxybutynin  (DITROPAN  XL) 10 MG 24 hr tablet, Take 1 tablet (10 mg total) by mouth at bedtime., Disp: 90 tablet, Rfl: 1   No Known Allergies   Review of Systems  Constitutional: Negative.   Respiratory: Negative.    Cardiovascular: Negative.   Neurological: Negative.   Psychiatric/Behavioral: Negative.       Today's Vitals   10/20/24 1146  BP: 120/70  Pulse: 84  Temp: 97.8 F (36.6 C)  TempSrc: Oral  Weight: 153 lb (69.4 kg)  Height: 5' 10 (1.778 m)  PainSc: 0-No pain   Body mass index is 21.95 kg/m.  Wt Readings from Last 3 Encounters:  10/20/24 153 lb (69.4 kg)  04/13/24 163 lb (73.9 kg)  04/11/24 163 lb (73.9 kg)     Objective:  Physical Exam Vitals and nursing note  reviewed.  Constitutional:      General: He is not in acute distress.    Appearance: Normal appearance.  HENT:     Mouth/Throat:     Dentition: Normal dentition. Does not have dentures.  Cardiovascular:     Rate and Rhythm: Normal rate and regular rhythm.     Pulses: Normal pulses.     Heart sounds: Normal heart sounds. No murmur heard. Pulmonary:     Effort: Pulmonary effort is normal. No respiratory distress.     Breath sounds: Normal breath sounds. No wheezing.  Abdominal:     General: Abdomen is flat. Bowel sounds are normal. There is no distension.     Palpations: Abdomen is soft.      Tenderness: There is no abdominal tenderness.  Skin:    General: Skin is warm and dry.     Capillary Refill: Capillary refill takes less than 2 seconds.  Neurological:     General: No focal deficit present.     Mental Status: He is alert and oriented to person, place, and time.     Cranial Nerves: No cranial nerve deficit.     Motor: No weakness.  Psychiatric:        Mood and Affect: Mood normal.        Behavior: Behavior normal.        Thought Content: Thought content normal.        Judgment: Judgment normal.         Assessment And Plan:  B12 deficiency Assessment & Plan: Vitamin B levels improved but decreasing again, increasing Alzheimer's and dementia risk. - Resent prescription for vitamin B tablet. - Advised to obtain vitamin B supplement over the counter if not available behind the counter.   Orders: -     Vitamin B12 -     Vitamin B-12; Take 1 tablet (1,000 mcg total) by mouth daily.  Dispense: 90 tablet; Refill: 1  Vitamin D  deficiency Assessment & Plan: Will check vitamin D  level and supplement as needed.    Also encouraged to spend 15 minutes in the sun daily.    Orders: -     VITAMIN D  25 Hydroxy (Vit-D Deficiency, Fractures)  Stage 3a chronic kidney disease (HCC) -     CMP14+EGFR  Functional urinary incontinence Assessment & Plan: Continue f/u with urology  Orders: -     oxyBUTYnin  Chloride ER; Take 1 tablet (10 mg total) by mouth at bedtime.  Dispense: 90 tablet; Refill: 1  Chronic idiopathic constipation Assessment & Plan: Continue current medications, this is better controlled.   Orders: -     linaCLOtide ; Take 1 capsule (145 mcg total) by mouth daily before breakfast.  Dispense: 90 capsule; Refill: 1  Aortic atherosclerosis Assessment & Plan: Continue statin, tolerating well.  Orders: -     Lipid panel -     Atorvastatin  Calcium ; TAKE 1 TABLET(10 MG) BY MOUTH DAILY  Dispense: 90 tablet; Refill: 1  Need for influenza  vaccination Assessment & Plan: Influenza vaccine administered Encouraged to take Tylenol  as needed for fever or muscle aches.   Orders: -     Flu vaccine HIGH DOSE PF(Fluzone Trivalent)  Sensation of feeling cold -     Iron, TIBC and Ferritin Panel  Unintended weight loss Assessment & Plan: Weight decreased from 163 to 153 pounds, likely due to inadequate dietary intake and possible malnutrition. - Encouraged increased intake of red meat and black beans to improve iron levels and overall nutrition. - Advised to  eat more regularly and avoid relying on tobacco for oral activity.   Other orders -     Mirtazapine ; TAKE 1 TABLET(7.5 MG) BY MOUTH AT BEDTIME  Dispense: 90 tablet; Refill: 1    Return in about 4 months (around 02/17/2025).  Patient was given opportunity to ask questions. Patient verbalized understanding of the plan and was able to repeat key elements of the plan. All questions were answered to their satisfaction.    LILLETTE Gaines Ada, FNP, have reviewed all documentation for this visit. The documentation on 10/20/24 for the exam, diagnosis, procedures, and orders are all accurate and complete.   IF YOU HAVE BEEN REFERRED TO A SPECIALIST, IT MAY TAKE 1-2 WEEKS TO SCHEDULE/PROCESS THE REFERRAL. IF YOU HAVE NOT HEARD FROM US /SPECIALIST IN TWO WEEKS, PLEASE GIVE US  A CALL AT [phone] X [phone].

## 2024-10-20 NOTE — Assessment & Plan Note (Signed)
 Continue statin, tolerating well

## 2024-10-20 NOTE — Assessment & Plan Note (Signed)
 Continue current medications, this is better controlled.

## 2024-10-20 NOTE — Assessment & Plan Note (Signed)
 Will check vitamin D  level and supplement as needed.    Also encouraged to spend 15 minutes in the sun daily.

## 2024-10-20 NOTE — Assessment & Plan Note (Signed)
Continue f/u with urology  

## 2024-10-20 NOTE — Assessment & Plan Note (Signed)
 Vitamin B levels improved but decreasing again, increasing Alzheimer's and dementia risk. - Resent prescription for vitamin B tablet. - Advised to obtain vitamin B supplement over the counter if not available behind the counter.

## 2024-10-20 NOTE — Assessment & Plan Note (Signed)
 Influenza vaccine administered Encouraged to take Tylenol as needed for fever or muscle aches.

## 2024-10-21 LAB — LIPID PANEL
Chol/HDL Ratio: 2.3 ratio (ref 0.0–5.0)
Cholesterol, Total: 155 mg/dL (ref 100–199)
HDL: 66 mg/dL (ref >39)
LDL Chol Calc (NIH): 80 mg/dL (ref 0–99)
Triglycerides: 36 mg/dL (ref 0–149)
VLDL Cholesterol Cal: 9 mg/dL (ref 5–40)

## 2024-10-21 LAB — VITAMIN D 25 HYDROXY (VIT D DEFICIENCY, FRACTURES): Vit D, 25-Hydroxy: 29.7 ng/mL — ABNORMAL LOW (ref 30.0–100.0)

## 2024-10-21 LAB — IRON,TIBC AND FERRITIN PANEL
Ferritin: 142 ng/mL (ref 30–400)
Iron Saturation: 36 % (ref 15–55)
Iron: 76 ug/dL (ref 38–169)
Total Iron Binding Capacity: 212 ug/dL — ABNORMAL LOW (ref 250–450)
UIBC: 136 ug/dL (ref 111–343)

## 2024-10-21 LAB — VITAMIN B12: Vitamin B-12: 232 pg/mL (ref 232–1245)

## 2024-10-21 LAB — CMP14+EGFR
ALT: 13 IU/L (ref 0–44)
AST: 25 IU/L (ref 0–40)
Albumin: 4.2 g/dL (ref 3.7–4.7)
Alkaline Phosphatase: 107 IU/L (ref 48–129)
BUN/Creatinine Ratio: 14 (ref 10–24)
BUN: 15 mg/dL (ref 8–27)
Bilirubin Total: 0.7 mg/dL (ref 0.0–1.2)
CO2: 24 mmol/L (ref 20–29)
Calcium: 9 mg/dL (ref 8.6–10.2)
Chloride: 106 mmol/L (ref 96–106)
Creatinine, Ser: 1.11 mg/dL (ref 0.76–1.27)
Globulin, Total: 2.3 g/dL (ref 1.5–4.5)
Glucose: 127 mg/dL — ABNORMAL HIGH (ref 70–99)
Potassium: 3.6 mmol/L (ref 3.5–5.2)
Sodium: 142 mmol/L (ref 134–144)
Total Protein: 6.5 g/dL (ref 6.0–8.5)
eGFR: 64 mL/min/1.73 (ref >59)

## 2024-10-24 ENCOUNTER — Telehealth: Payer: Self-pay

## 2024-11-15 ENCOUNTER — Telehealth: Payer: Self-pay

## 2024-11-15 NOTE — Patient Instructions (Signed)
 Legrand Gata - I am sorry I was unable to reach you today for our scheduled appointment. I work with Georgina Speaks, FNP and am calling to support your healthcare needs. Please contact me at [phone] at your earliest convenience. I look forward to speaking with you soon.   Thank you,   Clayborne Ly RN BSN CCM [place]  [facility], [facility] Health Nurse Care Coordinator  Direct Dial: [phone] Website: [email or web address]

## 2024-12-28 ENCOUNTER — Ambulatory Visit: Payer: Medicare PPO

## 2024-12-28 VITALS — BP 142/80 | HR 82 | Temp 98.6°F | Ht 73.0 in | Wt 151.0 lb

## 2024-12-28 DIAGNOSIS — Z0000 Encounter for general adult medical examination without abnormal findings: Secondary | ICD-10-CM | POA: Diagnosis not present

## 2024-12-28 NOTE — Progress Notes (Signed)
 "  Chief Complaint  Patient presents with   Medicare Wellness     Subjective:   Nathan Morse is a 89 y.o. male who presents for a Medicare Annual Wellness Visit.  Visit info / Clinical Intake: Medicare Wellness Visit Type:: Subsequent Annual Wellness Visit Persons participating in visit and providing information:: patient Medicare Wellness Visit Mode:: In-person (required for WTM) Interpreter Needed?: No Pre-visit prep was completed: yes AWV questionnaire completed by patient prior to visit?: no Living arrangements:: with family/others Patient's Overall Health Status Rating: (!) fair Typical amount of pain: none Does pain affect daily life?: no Are you currently prescribed opioids?: no  Dietary Habits and Nutritional Risks How many meals a day?: 2 Eats fruit and vegetables daily?: (!) no Most meals are obtained by: eating out In the last 2 weeks, have you had any of the following?: none Diabetic:: no  Functional Status Activities of Daily Living (to include ambulation/medication): Independent Ambulation: Independent Medication Administration: Independent Home Management (perform basic housework or laundry): Independent Manage your own finances?: yes Primary transportation is: driving Concerns about vision?: no *vision screening is required for WTM* Concerns about hearing?: no  Fall Screening Falls in the past year?: 0 Number of falls in past year: 0 Was there an injury with Fall?: 0 Fall Risk Category Calculator: 0 Patient Fall Risk Level: Low Fall Risk  Fall Risk Patient at Risk for Falls Due to: Medication side effect Fall risk Follow up: Falls prevention discussed; Falls evaluation completed  Home and Transportation Safety: All rugs have non-skid backing?: yes All stairs or steps have railings?: yes Grab bars in the bathtub or shower?: yes Have non-skid surface in bathtub or shower?: yes Good home lighting?: yes Regular seat belt use?: yes Hospital stays in  the last year:: no  Cognitive Assessment Difficulty concentrating, remembering, or making decisions? : yes Will 6CIT or Mini Cog be Completed: no 6CIT or Mini Cog Declined: patient alert, oriented, able to answer questions appropriately and recall recent events (patient not able to read and write)  Advance Directives (For Healthcare) Does Patient Have a Medical Advance Directive?: No Would patient like information on creating a medical advance directive?: No - Patient declined  Reviewed/Updated  Reviewed/Updated: Reviewed All (Medical, Surgical, Family, Medications, Allergies, Care Teams, Patient Goals)    Allergies (verified) Patient has no known allergies.   Current Medications (verified) Outpatient Encounter Medications as of 12/28/2024  Medication Sig   atorvastatin  (LIPITOR) 10 MG tablet TAKE 1 TABLET(10 MG) BY MOUTH DAILY   cyanocobalamin  (VITAMIN B12) 1000 MCG tablet Take 1 tablet (1,000 mcg total) by mouth daily.   docusate sodium (COLACE) 100 MG capsule Take 100 mg by mouth 2 (two) times daily.   linaclotide  (LINZESS ) 145 MCG CAPS capsule Take 1 capsule (145 mcg total) by mouth daily before breakfast.   mirtazapine  (REMERON ) 15 MG tablet TAKE 1 TABLET(7.5 MG) BY MOUTH AT BEDTIME   oxybutynin  (DITROPAN  XL) 10 MG 24 hr tablet Take 1 tablet (10 mg total) by mouth at bedtime.   No facility-administered encounter medications on file as of 12/28/2024.    History: Past Medical History:  Diagnosis Date   Cancer (HCC)    Muscle spasm 05/19/2019   Seborrheic keratosis, inflamed 10/16/2020   Skin Surgery    Urinary tract infection with hematuria 03/22/2019   Past Surgical History:  Procedure Laterality Date   APPENDECTOMY     INGUINAL HERNIA REPAIR Right 06/26/2020   Procedure: right open inguinal hernia repair with mesh;  Surgeon:  Kinsinger, Herlene Righter, MD;  Location: [facility];  Service: General;  Laterality: Right;   PENILE BIOPSY     SHOULDER ARTHROSCOPY     TONSILLECTOMY      TONSILLECTOMY AND ADENOIDECTOMY     Family History  Adopted: Yes  Family history unknown: Yes   Social History   Occupational History   Occupation: retired  Tobacco Use   Smoking status: Never   Smokeless tobacco: Current    Types: Chew   Tobacco comments:    wants to quit, but hard. 01/03/22 - has only chewed tobacco twice since his last visit.   Vaping Use   Vaping status: Never Used  Substance and Sexual Activity   Alcohol use: No    Alcohol/week: 0.0 standard drinks of alcohol   Drug use: No   Sexual activity: Not Currently   Tobacco Counseling Ready to quit: Not Answered Counseling given: Not Answered Tobacco comments: wants to quit, but hard. 01/03/22 - has only chewed tobacco twice since his last visit.   SDOH Screenings   Food Insecurity: No Food Insecurity (12/28/2024)  Housing: Unknown (12/28/2024)  Transportation Needs: No Transportation Needs (12/28/2024)  Utilities: Not At Risk (12/28/2024)  Alcohol Screen: Low Risk (12/28/2024)  Depression (PHQ2-9): Low Risk (12/28/2024)  Financial Resource Strain: Low Risk (12/28/2024)  Physical Activity: Inactive (12/28/2024)  Social Connections: Moderately Isolated (12/28/2024)  Stress: No Stress Concern Present (12/28/2024)  Tobacco Use: High Risk (12/28/2024)  Health Literacy: Inadequate Health Literacy (12/28/2024)   See flowsheets for full screening details  Depression Screen PHQ 2 & 9 Depression Scale- Over the past 2 weeks, how often have you been bothered by any of the following problems? Little interest or pleasure in doing things: 0 Feeling down, depressed, or hopeless (PHQ Adolescent also includes...irritable): 0 PHQ-2 Total Score: 0 Trouble falling or staying asleep, or sleeping too much: 0 Feeling tired or having little energy: 0 Poor appetite or overeating (PHQ Adolescent also includes...weight loss): 0 Feeling bad about yourself - or that you are a failure or have let yourself or your family down: 0 Trouble  concentrating on things, such as reading the newspaper or watching television (PHQ Adolescent also includes...like school work): 0 Moving or speaking so slowly that other people could have noticed. Or the opposite - being so fidgety or restless that you have been moving around a lot more than usual: 0 Thoughts that you would be better off dead, or of hurting yourself in some way: 0 PHQ-9 Total Score: 0 If you checked off any problems, how difficult have these problems made it for you to do your work, take care of things at home, or get along with other people?: Not difficult at all  Depression Treatment Depression Interventions/Treatment : [id number] Score <4 Follow-up Not Indicated     Goals Addressed             This Visit's Progress    Patient Stated       12/28/2024, stay healthy             Objective:    Today's Vitals   12/28/24 1117 12/28/24 1130  BP: (!) 160/84 (!) 142/80  Pulse: 82   Temp: 98.6 F (37 C)   TempSrc: Oral   SpO2: 97%   Weight: 151 lb (68.5 kg)   Height: 6' 1 (1.854 m)    Body mass index is 19.92 kg/m.  Hearing/Vision screen Hearing Screening - Comments:: Denies hearing issues Vision Screening - Comments:: No regular eye exams  Immunizations and Health Maintenance Health Maintenance  Topic Date Due   COVID-19 Vaccine (5 - 2025-26 season) 08/15/2024   Medicare Annual Wellness (AWV)  12/28/2025   DTaP/Tdap/Td (3 - Td or Tdap) 06/10/2032   Pneumococcal Vaccine: 50+ Years  Completed   Influenza Vaccine  Completed   Zoster Vaccines- Shingrix  Completed   Meningococcal B Vaccine  Aged Out   Hepatitis B Vaccines 19-59 Average Risk  Discontinued        Assessment/Plan:  This is a routine wellness examination for Jenner.  Patient Care Team: Georgina Speaks, FNP as PCP - General (General Practice)  I have personally reviewed and noted the following in the patients chart:   Medical and social history Use of alcohol, tobacco or illicit drugs   Current medications and supplements including opioid prescriptions. Functional ability and status Nutritional status Physical activity Advanced directives List of other physicians Hospitalizations, surgeries, and ER visits in previous 12 months Vitals Screenings to include cognitive, depression, and falls Referrals and appointments  No orders of the defined types were placed in this encounter.  In addition, I have reviewed and discussed with patient certain preventive protocols, quality metrics, and best practice recommendations. A written personalized care plan for preventive services as well as general preventive health recommendations were provided to patient.   Ardella FORBES Dawn, LPN   8/85/7973   Return in 1 year (on 12/28/2025).  After Visit Summary: (In Person-Printed) AVS printed and given to the patient  Nurse Notes: Vaccines not given: covid declined today  "

## 2024-12-28 NOTE — Patient Instructions (Signed)
 Nathan Morse,  Thank you for taking the time for your Medicare Wellness Visit. I appreciate your continued commitment to your health goals. Please review the care plan we discussed, and feel free to reach out if I can assist you further.  Please note that Annual Wellness Visits do not include a physical exam. Some assessments may be limited, especially if the visit was conducted virtually. If needed, we may recommend an in-person follow-up with your provider.  Ongoing Care Seeing your primary care provider every 3 to 6 months helps us  monitor your health and provide consistent, personalized care.   Referrals If a referral was made during today's visit and you haven't received any updates within two weeks, please contact the referred provider directly to check on the status.  Recommended Screenings:  Health Maintenance  Topic Date Due   COVID-19 Vaccine (5 - 2025-26 season) 08/15/2024   Medicare Annual Wellness Visit  12/02/2024   DTaP/Tdap/Td vaccine (3 - Td or Tdap) 06/10/2032   Pneumococcal Vaccine for age over 86  Completed   Flu Shot  Completed   Zoster (Shingles) Vaccine  Completed   Meningitis B Vaccine  Aged Out   Hepatitis B Vaccine  Discontinued       12/28/2024   11:24 AM  Advanced Directives  Does Patient Have a Medical Advance Directive? No  Would patient like information on creating a medical advance directive? No - Patient declined    Vision: Annual vision screenings are recommended for early detection of glaucoma, cataracts, and diabetic retinopathy. These exams can also reveal signs of chronic conditions such as diabetes and high blood pressure.  Dental: Annual dental screenings help detect early signs of oral cancer, gum disease, and other conditions linked to overall health, including heart disease and diabetes.  Please see the attached documents for additional preventive care recommendations.

## 2025-02-20 ENCOUNTER — Ambulatory Visit: Admitting: Nurse Practitioner

## 2026-01-03 ENCOUNTER — Ambulatory Visit: Payer: Self-pay
